# Patient Record
Sex: Male | Born: 1945
Health system: Southern US, Community
[De-identification: ages and names within clinical notes are randomized; demographics above are authoritative.]

## PROBLEM LIST (undated history)

## (undated) DIAGNOSIS — G8929 Other chronic pain: Secondary | ICD-10-CM

## (undated) DIAGNOSIS — N401 Enlarged prostate with lower urinary tract symptoms: Secondary | ICD-10-CM

## (undated) DIAGNOSIS — F329 Major depressive disorder, single episode, unspecified: Secondary | ICD-10-CM

## (undated) DIAGNOSIS — N368 Other specified disorders of urethra: Secondary | ICD-10-CM

## (undated) DIAGNOSIS — F32A Depression, unspecified: Secondary | ICD-10-CM

## (undated) DIAGNOSIS — E785 Hyperlipidemia, unspecified: Secondary | ICD-10-CM

## (undated) DIAGNOSIS — Z8719 Personal history of other diseases of the digestive system: Secondary | ICD-10-CM

## (undated) DIAGNOSIS — K760 Fatty (change of) liver, not elsewhere classified: Secondary | ICD-10-CM

## (undated) DIAGNOSIS — Z86010 Personal history of colonic polyps: Secondary | ICD-10-CM

## (undated) DIAGNOSIS — K449 Diaphragmatic hernia without obstruction or gangrene: Secondary | ICD-10-CM

## (undated) DIAGNOSIS — Z8619 Personal history of other infectious and parasitic diseases: Secondary | ICD-10-CM

## (undated) DIAGNOSIS — R972 Elevated prostate specific antigen [PSA]: Secondary | ICD-10-CM

## (undated) DIAGNOSIS — Z86006 Personal history of melanoma in-situ: Secondary | ICD-10-CM

## (undated) DIAGNOSIS — K5730 Diverticulosis of large intestine without perforation or abscess without bleeding: Secondary | ICD-10-CM

## (undated) DIAGNOSIS — G4730 Sleep apnea, unspecified: Secondary | ICD-10-CM

## (undated) DIAGNOSIS — I10 Essential (primary) hypertension: Secondary | ICD-10-CM

## (undated) DIAGNOSIS — F1011 Alcohol abuse, in remission: Secondary | ICD-10-CM

## (undated) DIAGNOSIS — N320 Bladder-neck obstruction: Secondary | ICD-10-CM

## (undated) DIAGNOSIS — Z860101 Personal history of adenomatous and serrated colon polyps: Secondary | ICD-10-CM

## (undated) DIAGNOSIS — K219 Gastro-esophageal reflux disease without esophagitis: Secondary | ICD-10-CM

## (undated) DIAGNOSIS — M549 Dorsalgia, unspecified: Secondary | ICD-10-CM

## (undated) HISTORY — PX: COLONOSCOPY: SHX174

## (undated) HISTORY — DX: Other chronic pain: G89.29

## (undated) HISTORY — DX: Hyperlipidemia, unspecified: E78.5

## (undated) HISTORY — DX: Dorsalgia, unspecified: M54.9

## (undated) HISTORY — DX: Fatty (change of) liver, not elsewhere classified: K76.0

## (undated) HISTORY — DX: Personal history of adenomatous and serrated colon polyps: Z86.0101

## (undated) HISTORY — DX: Diaphragmatic hernia without obstruction or gangrene: K44.9

## (undated) HISTORY — DX: Gastro-esophageal reflux disease without esophagitis: K21.9

## (undated) HISTORY — DX: Depression, unspecified: F32.A

## (undated) HISTORY — PX: UPPER GASTROINTESTINAL ENDOSCOPY: SHX188

## (undated) HISTORY — PX: CARDIOVASCULAR STRESS TEST: SHX262

## (undated) HISTORY — DX: Essential (primary) hypertension: I10

## (undated) HISTORY — PX: MELANOMA EXCISION: SHX5266

## (undated) HISTORY — PX: TONSILLECTOMY AND ADENOIDECTOMY: SUR1326

## (undated) HISTORY — DX: Major depressive disorder, single episode, unspecified: F32.9

## (undated) HISTORY — DX: Personal history of colonic polyps: Z86.010

---

## 1986-03-31 HISTORY — PX: OTHER SURGICAL HISTORY: SHX169

## 1999-09-15 ENCOUNTER — Encounter: Payer: Self-pay | Admitting: Internal Medicine

## 1999-09-15 ENCOUNTER — Emergency Department (HOSPITAL_COMMUNITY): Admission: EM | Admit: 1999-09-15 | Discharge: 1999-09-15 | Payer: Self-pay | Admitting: *Deleted

## 2001-06-07 ENCOUNTER — Encounter: Payer: Self-pay | Admitting: Gastroenterology

## 2002-11-18 ENCOUNTER — Encounter: Admission: RE | Admit: 2002-11-18 | Discharge: 2002-11-18 | Payer: Self-pay | Admitting: Family Medicine

## 2002-11-18 ENCOUNTER — Encounter: Payer: Self-pay | Admitting: Family Medicine

## 2003-01-29 ENCOUNTER — Ambulatory Visit (HOSPITAL_BASED_OUTPATIENT_CLINIC_OR_DEPARTMENT_OTHER): Admission: RE | Admit: 2003-01-29 | Discharge: 2003-01-29 | Payer: Self-pay | Admitting: Family Medicine

## 2004-07-16 ENCOUNTER — Ambulatory Visit: Payer: Self-pay | Admitting: Gastroenterology

## 2004-08-19 ENCOUNTER — Ambulatory Visit: Payer: Self-pay | Admitting: Gastroenterology

## 2005-05-19 ENCOUNTER — Ambulatory Visit: Payer: Self-pay | Admitting: Internal Medicine

## 2005-06-02 ENCOUNTER — Ambulatory Visit: Payer: Self-pay | Admitting: Internal Medicine

## 2005-06-19 ENCOUNTER — Encounter: Payer: Self-pay | Admitting: Cardiology

## 2005-06-19 ENCOUNTER — Ambulatory Visit: Payer: Self-pay

## 2005-08-04 ENCOUNTER — Ambulatory Visit: Payer: Self-pay | Admitting: Internal Medicine

## 2005-12-04 ENCOUNTER — Ambulatory Visit: Payer: Self-pay | Admitting: Internal Medicine

## 2006-06-02 ENCOUNTER — Ambulatory Visit: Payer: Self-pay | Admitting: Internal Medicine

## 2006-06-02 LAB — CONVERTED CEMR LAB
BUN: 19 mg/dL (ref 6–23)
Basophils Absolute: 0 10*3/uL (ref 0.0–0.1)
Basophils Relative: 0 % (ref 0–1)
CO2: 24 meq/L (ref 19–32)
Calcium: 9.2 mg/dL (ref 8.4–10.5)
Chloride: 104 meq/L (ref 96–112)
Creatinine, Ser: 0.97 mg/dL (ref 0.40–1.50)
Eosinophils Absolute: 0 10*3/uL (ref 0.0–0.7)
Eosinophils Relative: 0 % (ref 0–5)
Glucose, Bld: 103 mg/dL — ABNORMAL HIGH (ref 70–99)
HCT: 44.3 % (ref 39.0–52.0)
Hemoglobin: 15.3 g/dL (ref 13.0–17.0)
Lymphocytes Relative: 10 % — ABNORMAL LOW (ref 12–46)
Lymphs Abs: 0.8 10*3/uL (ref 0.7–3.3)
MCHC: 34.6 g/dL (ref 30.0–36.0)
MCV: 93.4 fL (ref 78.0–100.0)
Monocytes Absolute: 0.5 10*3/uL (ref 0.2–0.7)
Monocytes Relative: 7 % (ref 3–11)
Neutro Abs: 6.3 10*3/uL (ref 1.7–7.7)
Neutrophils Relative %: 83 % — ABNORMAL HIGH (ref 43–77)
Platelets: 133 10*3/uL — ABNORMAL LOW (ref 150–400)
Potassium: 4 meq/L (ref 3.5–5.3)
RBC: 4.74 M/uL (ref 4.22–5.81)
RDW: 13.3 % (ref 11.5–14.0)
Sodium: 138 meq/L (ref 135–145)
WBC: 7.6 10*3/uL (ref 4.0–10.5)

## 2006-06-09 ENCOUNTER — Ambulatory Visit: Payer: Self-pay | Admitting: Internal Medicine

## 2006-08-10 DIAGNOSIS — E785 Hyperlipidemia, unspecified: Secondary | ICD-10-CM | POA: Insufficient documentation

## 2006-08-11 ENCOUNTER — Ambulatory Visit: Payer: Self-pay | Admitting: Internal Medicine

## 2006-08-11 ENCOUNTER — Encounter: Payer: Self-pay | Admitting: Internal Medicine

## 2006-08-11 LAB — CONVERTED CEMR LAB
ALT: 42 units/L — ABNORMAL HIGH (ref 0–40)
AST: 30 units/L (ref 0–37)
Albumin: 3.7 g/dL (ref 3.5–5.2)
Alkaline Phosphatase: 74 units/L (ref 39–117)
Basophils Absolute: 0.1 10*3/uL (ref 0.0–0.1)
Basophils Relative: 1.1 % — ABNORMAL HIGH (ref 0.0–1.0)
Bilirubin, Direct: 0.1 mg/dL (ref 0.0–0.3)
Cholesterol: 233 mg/dL (ref 0–200)
Direct LDL: 142.8 mg/dL
Eosinophils Absolute: 0.1 10*3/uL (ref 0.0–0.6)
Eosinophils Relative: 1.3 % (ref 0.0–5.0)
HCT: 41.6 % (ref 39.0–52.0)
HDL: 38.2 mg/dL — ABNORMAL LOW (ref >39.0)
Hemoglobin: 14.2 g/dL (ref 13.0–17.0)
Lymphocytes Relative: 33 % (ref 12.0–46.0)
MCHC: 34.3 g/dL (ref 30.0–36.0)
MCV: 94.2 fL (ref 78.0–100.0)
Monocytes Absolute: 0.8 10*3/uL — ABNORMAL HIGH (ref 0.2–0.7)
Monocytes Relative: 12.6 % — ABNORMAL HIGH (ref 3.0–11.0)
Neutro Abs: 3.2 10*3/uL (ref 1.4–7.7)
Neutrophils Relative %: 52 % (ref 43.0–77.0)
PSA: 3.11 ng/mL (ref 0.10–4.00)
Platelets: 191 10*3/uL (ref 150–400)
RBC: 4.41 M/uL (ref 4.22–5.81)
RDW: 12.8 % (ref 11.5–14.6)
Total Bilirubin: 0.8 mg/dL (ref 0.3–1.2)
Total CHOL/HDL Ratio: 6.1
Total Protein: 6.9 g/dL (ref 6.0–8.3)
Triglycerides: 155 mg/dL — ABNORMAL HIGH (ref 0–149)
VLDL: 31 mg/dL (ref 0–40)
WBC: 6.2 10*3/uL (ref 4.5–10.5)

## 2006-08-25 ENCOUNTER — Ambulatory Visit: Payer: Self-pay | Admitting: Cardiology

## 2006-09-17 ENCOUNTER — Encounter: Payer: Self-pay | Admitting: Internal Medicine

## 2006-09-28 ENCOUNTER — Telehealth: Payer: Self-pay | Admitting: Internal Medicine

## 2006-10-13 ENCOUNTER — Ambulatory Visit: Payer: Self-pay | Admitting: Gastroenterology

## 2006-10-13 LAB — CONVERTED CEMR LAB
Amylase: 61 units/L (ref 27–131)
Folate: 18.9 ng/mL
Lipase: 21 units/L (ref 11.0–59.0)
Rheumatoid fact SerPl-aCnc: <20 intl units/mL — ABNORMAL LOW (ref 0.0–20.0)
Tissue Transglutaminase Ab, IgA: 0.6 units (ref <7)
Vitamin B-12: 471 pg/mL (ref 211–911)

## 2006-10-19 ENCOUNTER — Encounter: Payer: Self-pay | Admitting: Gastroenterology

## 2006-11-25 ENCOUNTER — Ambulatory Visit: Payer: Self-pay | Admitting: Gastroenterology

## 2006-12-02 ENCOUNTER — Ambulatory Visit: Payer: Self-pay | Admitting: Gastroenterology

## 2006-12-02 ENCOUNTER — Encounter: Payer: Self-pay | Admitting: Internal Medicine

## 2007-01-01 ENCOUNTER — Ambulatory Visit: Payer: Self-pay | Admitting: Gastroenterology

## 2007-01-01 LAB — CONVERTED CEMR LAB
Fecal Occult Blood: NEGATIVE
OCCULT 1: NEGATIVE
OCCULT 2: NEGATIVE
OCCULT 3: NEGATIVE
OCCULT 4: NEGATIVE
OCCULT 5: NEGATIVE

## 2007-02-18 ENCOUNTER — Ambulatory Visit: Payer: Self-pay | Admitting: Internal Medicine

## 2007-06-08 ENCOUNTER — Ambulatory Visit: Payer: Self-pay | Admitting: Internal Medicine

## 2007-06-08 DIAGNOSIS — I10 Essential (primary) hypertension: Secondary | ICD-10-CM | POA: Insufficient documentation

## 2007-06-08 DIAGNOSIS — B351 Tinea unguium: Secondary | ICD-10-CM

## 2007-06-08 DIAGNOSIS — T887XXA Unspecified adverse effect of drug or medicament, initial encounter: Secondary | ICD-10-CM

## 2007-06-15 ENCOUNTER — Encounter (INDEPENDENT_AMBULATORY_CARE_PROVIDER_SITE_OTHER): Payer: Self-pay | Admitting: *Deleted

## 2007-06-15 LAB — CONVERTED CEMR LAB
ALT: 35 units/L (ref 0–53)
AST: 23 units/L (ref 0–37)
Albumin: 3.7 g/dL (ref 3.5–5.2)
Alkaline Phosphatase: 77 units/L (ref 39–117)
BUN: 15 mg/dL (ref 6–23)
Basophils Absolute: 0 10*3/uL (ref 0.0–0.1)
Basophils Relative: 0.5 % (ref 0.0–1.0)
Bilirubin, Direct: 0.1 mg/dL (ref 0.0–0.3)
Creatinine, Ser: 1 mg/dL (ref 0.4–1.5)
Eosinophils Absolute: 0.1 10*3/uL (ref 0.0–0.6)
Eosinophils Relative: 1.2 % (ref 0.0–5.0)
HCT: 42.9 % (ref 39.0–52.0)
Hemoglobin: 14.4 g/dL (ref 13.0–17.0)
Hgb A1c MFr Bld: 5.6 % (ref 4.6–6.0)
Lymphocytes Relative: 30.1 % (ref 12.0–46.0)
MCHC: 33.6 g/dL (ref 30.0–36.0)
MCV: 95.3 fL (ref 78.0–100.0)
Monocytes Absolute: 0.7 10*3/uL (ref 0.2–0.7)
Monocytes Relative: 9.7 % (ref 3.0–11.0)
Neutro Abs: 4 10*3/uL (ref 1.4–7.7)
Neutrophils Relative %: 58.5 % (ref 43.0–77.0)
Platelets: 161 10*3/uL (ref 150–400)
Potassium: 3.9 meq/L (ref 3.5–5.1)
RBC: 4.5 M/uL (ref 4.22–5.81)
RDW: 12.6 % (ref 11.5–14.6)
Total Bilirubin: 0.7 mg/dL (ref 0.3–1.2)
Total Protein: 6.6 g/dL (ref 6.0–8.3)
WBC: 6.8 10*3/uL (ref 4.5–10.5)

## 2007-07-16 DIAGNOSIS — I739 Peripheral vascular disease, unspecified: Secondary | ICD-10-CM

## 2007-07-16 DIAGNOSIS — K5730 Diverticulosis of large intestine without perforation or abscess without bleeding: Secondary | ICD-10-CM | POA: Insufficient documentation

## 2007-07-16 DIAGNOSIS — K649 Unspecified hemorrhoids: Secondary | ICD-10-CM | POA: Insufficient documentation

## 2007-07-16 DIAGNOSIS — M549 Dorsalgia, unspecified: Secondary | ICD-10-CM | POA: Insufficient documentation

## 2007-07-16 DIAGNOSIS — G4700 Insomnia, unspecified: Secondary | ICD-10-CM | POA: Insufficient documentation

## 2007-07-16 DIAGNOSIS — N402 Nodular prostate without lower urinary tract symptoms: Secondary | ICD-10-CM

## 2007-09-16 ENCOUNTER — Ambulatory Visit: Payer: Self-pay | Admitting: Internal Medicine

## 2007-09-16 DIAGNOSIS — F1021 Alcohol dependence, in remission: Secondary | ICD-10-CM | POA: Insufficient documentation

## 2007-09-20 ENCOUNTER — Encounter: Payer: Self-pay | Admitting: Internal Medicine

## 2007-09-21 ENCOUNTER — Encounter (INDEPENDENT_AMBULATORY_CARE_PROVIDER_SITE_OTHER): Payer: Self-pay | Admitting: *Deleted

## 2008-03-17 ENCOUNTER — Ambulatory Visit: Payer: Self-pay | Admitting: Internal Medicine

## 2008-11-27 DIAGNOSIS — K222 Esophageal obstruction: Secondary | ICD-10-CM | POA: Insufficient documentation

## 2008-11-27 DIAGNOSIS — K449 Diaphragmatic hernia without obstruction or gangrene: Secondary | ICD-10-CM | POA: Insufficient documentation

## 2008-11-27 DIAGNOSIS — Z860100 Personal history of colon polyps, unspecified: Secondary | ICD-10-CM

## 2008-11-27 DIAGNOSIS — K210 Gastro-esophageal reflux disease with esophagitis: Secondary | ICD-10-CM

## 2008-11-27 HISTORY — DX: Personal history of colon polyps, unspecified: Z86.0100

## 2008-11-27 HISTORY — DX: Personal history of colonic polyps: Z86.010

## 2008-11-30 ENCOUNTER — Ambulatory Visit: Payer: Self-pay | Admitting: Gastroenterology

## 2008-11-30 DIAGNOSIS — R109 Unspecified abdominal pain: Secondary | ICD-10-CM | POA: Insufficient documentation

## 2008-12-07 ENCOUNTER — Ambulatory Visit (HOSPITAL_COMMUNITY): Admission: RE | Admit: 2008-12-07 | Discharge: 2008-12-07 | Payer: Self-pay | Admitting: Gastroenterology

## 2008-12-19 ENCOUNTER — Telehealth: Payer: Self-pay | Admitting: Gastroenterology

## 2009-03-13 ENCOUNTER — Ambulatory Visit: Payer: Self-pay | Admitting: Internal Medicine

## 2009-07-17 ENCOUNTER — Telehealth: Payer: Self-pay | Admitting: Gastroenterology

## 2009-07-20 ENCOUNTER — Encounter: Payer: Self-pay | Admitting: Gastroenterology

## 2009-09-29 ENCOUNTER — Emergency Department (HOSPITAL_BASED_OUTPATIENT_CLINIC_OR_DEPARTMENT_OTHER): Admission: EM | Admit: 2009-09-29 | Discharge: 2009-09-29 | Payer: Self-pay | Admitting: Emergency Medicine

## 2010-01-31 ENCOUNTER — Ambulatory Visit: Payer: Self-pay | Admitting: Internal Medicine

## 2010-02-25 ENCOUNTER — Telehealth: Payer: Self-pay | Admitting: Gastroenterology

## 2010-03-12 ENCOUNTER — Ambulatory Visit: Payer: Self-pay | Admitting: Internal Medicine

## 2010-03-12 DIAGNOSIS — J019 Acute sinusitis, unspecified: Secondary | ICD-10-CM

## 2010-04-28 LAB — CONVERTED CEMR LAB
ALT: 31 units/L (ref 0–53)
Albumin: 3.9 g/dL (ref 3.5–5.2)
Alkaline Phosphatase: 76 units/L (ref 39–117)
BUN: 15 mg/dL (ref 6–23)
CO2: 31 meq/L (ref 19–32)
Calcium: 9.3 mg/dL (ref 8.4–10.5)
Eosinophils Relative: 0.9 % (ref 0.0–5.0)
GFR calc Af Amer: 98 mL/min
Glucose, Bld: 94 mg/dL (ref 70–99)
HCT: 42.5 % (ref 39.0–52.0)
Hemoglobin: 14.8 g/dL (ref 13.0–17.0)
Monocytes Absolute: 0.5 10*3/uL (ref 0.1–1.0)
Monocytes Relative: 8.7 % (ref 3.0–12.0)
Neutro Abs: 3.6 10*3/uL (ref 1.4–7.7)
Potassium: 4.4 meq/L (ref 3.5–5.1)
RBC: 4.52 M/uL (ref 4.22–5.81)
TSH: 1.76 microintl units/mL (ref 0.35–5.50)
Total CHOL/HDL Ratio: 5.6
Total Protein: 6.9 g/dL (ref 6.0–8.3)
WBC: 5.9 10*3/uL (ref 4.5–10.5)

## 2010-05-02 NOTE — Assessment & Plan Note (Signed)
Summary: POSSIBLE URI X7 DAYS/KB   Vital Signs:  Patient profile:   65 year old male Weight:      199 pounds BMI:     32.24 Temp:     99.0 degrees F oral Pulse rate:   72 / minute Resp:     16 per minute BP sitting:   148 / 96  (left arm) Cuff size:   large  Vitals Entered By: Shonna Chock CMA (March 12, 2010 4:30 PM) CC: URI x 7days, URI symptoms   Primary Care Provider:  Elita Quick Paz,MD  CC:  URI x 7days and URI symptoms.  History of Present Illness:      This is a 65 year old man who presents with RTI symptoms X 7 days, onset as ST .  The patient reports nasal congestion,yellow  purulent nasal discharge, and productive cough with dark green, but denies earache.  Associated symptoms include fever of 100.5-103 degrees.  The patient denies dyspnea and wheezing.  The patient denies headache.  The patient denies the following risk factors for Strep sinusitis: unilateral facial pain, tooth pain, and tender adenopathy.Rx: Sudafed (see BP)    Current Medications (verified): 1)  Mylanta 200-200-20 Mg/28ml Susp (Alum & Mag Hydroxide-Simeth) .... As Needed 2)  Aciphex 20 Mg  Tbec (Rabeprazole Sodium) .... Take 1 Each Day 30 Minutes Before Meals  Allergies: 1)  ! Pcn  Physical Exam  General:  well-nourished,in no acute distress; alert,appropriate and cooperative throughout examination Ears:  External ear exam shows no significant lesions or deformities.  Otoscopic examination reveals clear canals, tympanic membranes are intact bilaterally without bulging, retraction, inflammation or discharge. Hearing is grossly normal bilaterally. Nose:  External nasal examination shows no deformity or inflammation. Nasal mucosa are pink and moist without lesions or exudates. Septal dislocation Mouth:  Oral mucosa and oropharynx without lesions or exudates.  Teeth in good repair. Lungs:  Normal respiratory effort, chest expands symmetrically. Lungs are clear to auscultation, no crackles or  wheezes. Heart:  Normal rate and regular rhythm. S1 and S2 normal without gallop, murmur, click, rub .  Cervical Nodes:  No lymphadenopathy noted Axillary Nodes:  No palpable lymphadenopathy   Impression & Recommendations:  Problem # 1:  SINUSITIS- ACUTE-NOS (ICD-461.9)  His updated medication list for this problem includes:    Smz-tmp Ds 800-160 Mg Tabs (Sulfamethoxazole-trimethoprim) .Marland Kitchen... 1 two times a day with 8 oz water    Fluticasone Propionate 50 Mcg/act Susp (Fluticasone propionate) .Marland Kitchen... 1 two times a day as needed  Problem # 2:  BRONCHITIS-ACUTE (ICD-466.0)  His updated medication list for this problem includes:    Smz-tmp Ds 800-160 Mg Tabs (Sulfamethoxazole-trimethoprim) .Marland Kitchen... 1 two times a day with 8 oz water  Complete Medication List: 1)  Mylanta 200-200-20 Mg/54ml Susp (Alum & mag hydroxide-simeth) .... As needed 2)  Aciphex 20 Mg Tbec (Rabeprazole sodium) .... Take 1 each day 30 minutes before meals 3)  Smz-tmp Ds 800-160 Mg Tabs (Sulfamethoxazole-trimethoprim) .Marland Kitchen.. 1 two times a day with 8 oz water 4)  Fluticasone Propionate 50 Mcg/act Susp (Fluticasone propionate) .Marland Kitchen.. 1 two times a day as needed  Patient Instructions: 1)  Monitor BP OFF Sudafed. Neti pot once daily - two times a day as needed for sinus congestion. 2)  Drink as much NON dairy  fluid as you can tolerate for the next few days. Prescriptions: FLUTICASONE PROPIONATE 50 MCG/ACT SUSP (FLUTICASONE PROPIONATE) 1 two times a day as needed  #1 x 3   Entered and Authorized  by:   Marga Melnick MD   Signed by:   Marga Melnick MD on 03/12/2010   Method used:   [facility]ed to ...       [facility] [phone]* (retail)       [address]       [place], [place]  [address]       Ph: [phone]       Fax: [phone]   RxID:   [id number] SMZ-TMP DS 800-160 MG TABS (SULFAMETHOXAZOLE-TRIMETHOPRIM) 1 two times a day with 8 oz water  #20 x 0   Entered and Authorized by:   Marga Melnick MD   Signed by:   Marga Melnick MD on 03/12/2010   Method used:   Faxed to ...       [facility] [phone]* (retail)       [address]       [place], [place]  [address]       Ph: [phone]       Fax: [phone]   RxID:   [id number]    Orders Added: 1)  Est. Patient Level III [31517]

## 2010-05-02 NOTE — Progress Notes (Signed)
Summary: RX   Phone Note Call from Patient Call back at Home Phone [phone]   Caller: Patient Call For: Dr Jarold Motto Reason for Call: Refill Medication, Talk to Nurse Details for Reason: RX Summary of Call: Pt needs refill on Aciphex, would like it to be sent to [facility] Initial call taken by: Dwan Bolt,  February 25, 2010 12:47 PM  Follow-up for Phone Call        rx sent Follow-up by: Harlow Mares CMA Duncan Dull),  February 25, 2010 1:44 PM    Prescriptions: ACIPHEX 20 MG  TBEC (RABEPRAZOLE SODIUM) Take 1 each day 30 minutes before meals  #30 x 6   Entered by:   Harlow Mares CMA (AAMA)   Authorized by:   Mardella Layman MD [facility]   Signed by:   Harlow Mares CMA (AAMA) on 02/25/2010   Method used:   Electronically to        [facility]* (retail)       [address]       [place], [place]  [id number]       Ph: [phone]       Fax: [phone]   RxID:   [id number] ACIPHEX 20 MG  TBEC (RABEPRAZOLE SODIUM) Take 1 each day 30 minutes before meals  #30 x 6   Entered by:   Harlow Mares CMA (AAMA)   Authorized by:   Mardella Layman MD [facility]   Signed by:   Harlow Mares CMA (AAMA) on 02/25/2010   Method used:   Electronically to        [facility] [phone]* (retail)       [address]       [place], [place]  [address]       Ph: [phone]       Fax: [phone]   RxID:   [id number]

## 2010-05-02 NOTE — Progress Notes (Signed)
Summary: Triage  Medications Added ACIPHEX 20 MG  TBEC (RABEPRAZOLE SODIUM) Take 1 each day 30 minutes before meals       Phone Note Call from Patient Call back at Home Phone [phone]   Caller: Patient Call For: Dr. Jarold Motto Reason for Call: Talk to Nurse Summary of Call: Protonix is too expensive and wants an alternative med. Initial call taken by: Karna Christmas,  July 17, 2009 12:06 PM  Follow-up for Phone Call        Pt states generic meds do not help him.  Request change in med.  Needs something that is not available in generic.  States has tried Nexium in the past and it did not help.   Follow-up by: Ashok Cordia RN,  July 17, 2009 1:43 PM    New/Updated Medications: ACIPHEX 20 MG  TBEC (RABEPRAZOLE SODIUM) Take 1 each day 30 minutes before meals Prescriptions: ACIPHEX 20 MG  TBEC (RABEPRAZOLE SODIUM) Take 1 each day 30 minutes before meals  #30 x 6   Entered by:   Ashok Cordia RN   Authorized by:   Mardella Layman MD [facility]   Signed by:   Ashok Cordia RN on 07/17/2009   Method used:   Electronically to        [facility] [phone]* (retail)       [address]       [place], [place]  [address]       Ph: [phone]       Fax: [phone]   RxID:   [id number]   Appended Document: Triage I called [employer] and a form for prior authorization was faxed to me because there has to be approval for the Aciphex.  I filled the form out and faxed to [facility].

## 2010-05-02 NOTE — Medication Information (Signed)
Summary: Aciphex Approved/[facility]  Aciphex Approved/[facility]   Imported By: Sherian Rein 07/24/2009 13:43:20  _____________________________________________________________________  External Attachment:    Type:   Image     Comment:   External Document

## 2010-05-02 NOTE — Assessment & Plan Note (Signed)
Summary: flu shot//ph   Nurse Visit   Allergies: 1)  ! Pcn  Orders Added: 1)  Admin 1st Vaccine [90471] 2)  Flu Vaccine 26yrs + [09811] Flu Vaccine Consent Questions     Do you have a history of severe allergic reactions to this vaccine? no    Any prior history of allergic reactions to egg and/or gelatin? no    Do you have a sensitivity to the preservative Thimersol? no    Do you have a past history of Guillan-Barre Syndrome? no    Do you currently have an acute febrile illness? no    Have you ever had a severe reaction to latex? no    Vaccine information given and explained to patient? yes    Are you currently pregnant? no    Lot Number:[id number]   Exp Date:09/28/2010   Site Given  Left Deltoid IM

## 2010-05-28 ENCOUNTER — Telehealth: Payer: Self-pay | Admitting: Gastroenterology

## 2010-06-06 NOTE — Progress Notes (Signed)
Summary: please change pharmacy in system   Phone Note Call from Patient Call back at Home Phone [phone]   Caller: Patient Call For: Dr Jarold Motto Reason for Call: Talk to Nurse Summary of Call: [facility]ateint states [facility]. Initial call taken by: Tawni Levy,  May 28, 2010 10:13 AM  Follow-up for Phone Call        rx sent.  Follow-up by: Harlow Mares CMA (AAMA),  May 28, 2010 10:21 AM    Prescriptions: ACIPHEX 20 MG  TBEC (RABEPRAZOLE SODIUM) Take 1 each day 30 minutes before meals  #30 x 6   Entered by:   Harlow Mares CMA (AAMA)   Authorized by:   Mardella Layman MD [facility]   Signed by:   Harlow Mares CMA (AAMA) on 05/28/2010   Method used:   Electronically to        [facility]* (retail)       [address]       [place], [place]  [id number]       Ph: [phone]       Fax: [phone]   RxID:   [id number]

## 2010-06-20 ENCOUNTER — Emergency Department (HOSPITAL_BASED_OUTPATIENT_CLINIC_OR_DEPARTMENT_OTHER)
Admission: EM | Admit: 2010-06-20 | Discharge: 2010-06-20 | Disposition: A | Discharge status: Home / Self Care | Payer: Managed Care, Other (non HMO) | Attending: Emergency Medicine | Admitting: Emergency Medicine

## 2010-06-20 DIAGNOSIS — K219 Gastro-esophageal reflux disease without esophagitis: Secondary | ICD-10-CM | POA: Insufficient documentation

## 2010-06-20 DIAGNOSIS — Y92009 Unspecified place in unspecified non-institutional (private) residence as the place of occurrence of the external cause: Secondary | ICD-10-CM | POA: Insufficient documentation

## 2010-06-20 DIAGNOSIS — Y838 Other surgical procedures as the cause of abnormal reaction of the patient, or of later complication, without mention of misadventure at the time of the procedure: Secondary | ICD-10-CM | POA: Insufficient documentation

## 2010-06-20 DIAGNOSIS — I10 Essential (primary) hypertension: Secondary | ICD-10-CM | POA: Insufficient documentation

## 2010-06-20 DIAGNOSIS — IMO0002 Reserved for concepts with insufficient information to code with codable children: Secondary | ICD-10-CM | POA: Insufficient documentation

## 2010-08-12 ENCOUNTER — Telehealth: Payer: Self-pay | Admitting: Gastroenterology

## 2010-08-12 NOTE — Telephone Encounter (Signed)
Called [facility] patient picked up aciphex on 5/7 and he still has refills, called patient and left message for him to call [facility] we [facility] not [facility] and he is not due for another rx yet.

## 2010-08-13 NOTE — Assessment & Plan Note (Signed)
[facility]                         GASTROENTEROLOGY OFFICE NOTE   Gerald [name], Gerald [name]                    MRN:          [id number]  DATE:10/13/2006                            DOB:          Nov 28, 1945    [name] is a 65 year old white male, retired male, referred for  evaluation of diarrhea, abdominal gas and bloating, guaiac positive  stools.   I hae known [name] for many years, and have done previous  colonoscopies as screening procedures for a family history of colon  cancer in his maternal grandmother.  His last exam was Aug 19, 2004, and  he did have diverticulosis present.  He has had colon polyps removed in  the past, and has had rather prominent hemorrhoids.  Also, I have been  treating him for acid reflux, which he has had for many years, which is  well controlled on daily Prevacid.  He did have an endoscopic exam  performed in March 2003, and had an esophageal stricture that was  dilated.   Gerald [name] has up to 4 to 5 loose bowel movements a day, but no nocturnal  diarrhea, anorexia, or weight loss.  He has a past history of  alcoholism, and has not drunk in 20 years.  He has no known history of  pancreatitis or hepatitis.  He denies any specific food intolerances.  As mentioned above, he has no upper GI complaints on daily PPI therapy.  He recently saw Dr. Drue [name] in March, and had a complete physical exam with  a normal CBC and metabolic profile.  Hemoccult cards at that time were  guaiac positive.  Lab data did reveal slight hyperlipidemia, but no  anemia or abnormal liver function test.  I cannot see where he has had  previous gallbladder ultrasound exam.   MEDICATIONS:  1. Protonix 40 mg a day.  2. Aspirin 81 mg every other day.  3. Fish oil 1200 mg a day.   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   FAMILY HISTORY:  Remarkable for colon cancer in maternal grandmother at  an elderly age.   SOCIAL HISTORY:  He is married  and lives with his wife and son, and has  a Naval architect.  He currently is retired.  He does not use alcohol,  and stopped smoking on June 02, 2006 after 40 years of use.   REVIEW OF SYSTEMS:  Noncontributory except for some nonspecific  complaints such as insomnia, chronic back pain, excessive urination, and  shortness of breath with exertion.  He gives no history of known  specific cardiovascular or pulmonary problems.   EXAMINATION:  He is a healthy-appearing white male who appears older  than his stated age.  He is 5 feet 7 inches tall, and weighs 196 pounds.  Blood pressure  120/80.  Pulse was 64 and regular.  I did not appreciate stigmata of chronic liver disease.  CHEST:  Generally clear.  He appeared to be in a regular rhythm without murmurs, gallops, or rubs.  ABDOMEN:  Showed no hepatosplenomegaly, masses, or tenderness.  Bowel  sounds were normal.  PERIPHERAL EXTREMITIES:  Were unremarkable without edema or phlebitis.  Mental status was clear.  Inspection of rectum did show some mixed hemorrhoids, but no digital  fistulae.   ASSESSMENT:  1. Chronic diverticulosis.  Rule out chronic malabsorption from      pancreatitis related to his previous heavy ethanol intake.  It      certainly sounds like he is having more frequent stools than normal      occurrence.  2. Guaiac positive stool with family history of colon cancer.  Rule      out colonic polyposis.  3. Well-controlled acid reflux on daily proton pump inhibitor therapy.  4. History of hyperlipidemia.  5. Long history of cigarette and alcohol abuse.  6. Chronic nonspecific urologic problems.  He was recently referred by      Dr. Drue [name] to Urology because of prostate nodule.   RECOMMENDATIONS:  1. Screening laboratory parameters including celiac panel, serum      carotene, and stool [place] stain.  2. Continue reflux regime and daily Protonix.  3. Outpatient followup colonoscopy, and perhaps endoscopy for his       positive stools.  4. Continue other medications per Dr. Drue [name].     Gerald Rea. Jarold Motto, MD, Caleen Essex, FAGA  Electronically Signed    DRP/MedQ  DD: 10/13/2006  DT: 10/14/2006  Job #: [id number]

## 2010-08-16 NOTE — Assessment & Plan Note (Signed)
[facility]                                 ON-CALL NOTE   JERRICK, FARVE                      MRN:          [id number]  DATE:06/02/2006                            DOB:          09-09-1945    Phone call came from [place] with [facility] at [phone].   He had some STAT labs - a CBC and a BMET.  Really, nothing remarkable on  the CBC.  Platelet count was borderline low at 133.  The BMET was all  normal except for a borderline glucose of 103.   PLAN:  No action needs to be taken.  The lab will be routed to Dr. Drue Novel,  the regular doctor, tomorrow.     Karie Schwalbe, MD  Electronically Signed    RIL/MedQ  DD: 06/02/2006  DT: 06/03/2006  Job #: [id number]   cc:   Willow Ora, MD

## 2010-08-29 ENCOUNTER — Ambulatory Visit (INDEPENDENT_AMBULATORY_CARE_PROVIDER_SITE_OTHER): Payer: Managed Care, Other (non HMO) | Admitting: Internal Medicine

## 2010-08-29 ENCOUNTER — Encounter: Payer: Self-pay | Admitting: Internal Medicine

## 2010-08-29 DIAGNOSIS — K219 Gastro-esophageal reflux disease without esophagitis: Secondary | ICD-10-CM

## 2010-08-29 DIAGNOSIS — Z0000 Encounter for general adult medical examination without abnormal findings: Secondary | ICD-10-CM | POA: Insufficient documentation

## 2010-08-29 LAB — BASIC METABOLIC PANEL
Calcium: 9.2 mg/dL (ref 8.4–10.5)
Chloride: 105 mEq/L (ref 96–112)
Creatinine, Ser: 0.9 mg/dL (ref 0.4–1.5)
Sodium: 143 mEq/L (ref 135–145)

## 2010-08-29 LAB — POCT URINALYSIS DIPSTICK
Glucose, UA: NEGATIVE
Ketones, UA: NEGATIVE
Leukocytes, UA: NEGATIVE
Protein, UA: 100
Urobilinogen, UA: NEGATIVE

## 2010-08-29 LAB — AST: AST: 24 U/L (ref 0–37)

## 2010-08-29 LAB — PSA: PSA: 5.66 ng/mL — ABNORMAL HIGH (ref 0.10–4.00)

## 2010-08-29 LAB — CBC WITH DIFFERENTIAL/PLATELET
Basophils Absolute: 0 10*3/uL (ref 0.0–0.1)
Eosinophils Relative: 1.2 % (ref 0.0–5.0)
HCT: 45 % (ref 39.0–52.0)
Lymphs Abs: 2 10*3/uL (ref 0.7–4.0)
Monocytes Relative: 9.4 % (ref 3.0–12.0)
Neutrophils Relative %: 58.4 % (ref 43.0–77.0)
Platelets: 158 10*3/uL (ref 150.0–400.0)
RDW: 13.5 % (ref 11.5–14.6)
WBC: 6.7 10*3/uL (ref 4.5–10.5)

## 2010-08-29 LAB — LIPID PANEL
Cholesterol: 203 mg/dL — ABNORMAL HIGH (ref 0–200)
HDL: 44.8 mg/dL (ref >39.00)
Total CHOL/HDL Ratio: 5
Triglycerides: 205 mg/dL — ABNORMAL HIGH (ref 0.0–149.0)
VLDL: 41 mg/dL — ABNORMAL HIGH (ref 0.0–40.0)

## 2010-08-29 LAB — LDL CHOLESTEROL, DIRECT: Direct LDL: 140 mg/dL

## 2010-08-29 NOTE — Assessment & Plan Note (Addendum)
TD due 2013 zostavax info provided Few years ago there was a ? of a prostate nodule, saw urology, no nodule found DRE today normal H/o + hemocults, saw GI, Cscope 2008 (tics), GI  repeated hemocults and they were neg Next Cscope: per GI Diet-exercise discussed  labs

## 2010-08-29 NOTE — Progress Notes (Signed)
Subjective:    Patient ID: KERNEY NIHART, male    DOB: 1946/02/17, 65 y.o.   MRN: [id number]  HPI CPX Past Medical History  Diagnosis Date  . Hypertension   . Hyperlipidemia   . 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 Alcohol abuse     quit 1989  . Hiatal hernia     EGD 3/10/3 stricture  . OSA (obstructive sleep apnea)     mild, per sleep study   Past Surgical History  Procedure Date  . Tonsillectomy and adenoidectomy   . Cystectomy     Right leg remotely, back 2012  . Malignant mass rmoeved     left shoulder   Family History  Problem Relation Age of Onset  . Coronary artery disease Father   . Coronary artery disease Paternal Grandfather   . Coronary artery disease Maternal Grandmother   . Hypertension Maternal Aunt   . Diabetes Neg Hx   . Colon cancer Maternal Grandmother   . Prostate cancer Neg Hx   . Stroke Neg Hx   . Melanoma Mother    History   Social History  . Marital Status: Married    Spouse Name: N/A    Number of Children: 2  . Years of Education: N/A   Occupational History  . Retired    Social History Main Topics  . Smoking status: Former Smoker    Quit date: 08/29/2006  . Smokeless tobacco: Not on file  . Alcohol Use: No     quit alcohol 1989  . Drug Use: No  . Sexually Active: Not on file   Other Topics Concern  . Not on file   Social History Narrative   Diet: regular, lots of vegetables-----exercise : none but active     Review of Systems  Constitutional: Negative for fever and fatigue.  Respiratory: Negative for cough and shortness of breath.   Cardiovascular: Negative for chest pain and leg swelling.  Gastrointestinal: Negative for nausea, diarrhea and blood in stool.  Genitourinary: Negative for dysuria, hematuria and difficulty urinating.       Occ nocturia   Psychiatric/Behavioral:       No anxiety-depression       Objective:   Physical Exam  Constitutional: He is oriented to person,  place, and time. He appears well-developed and well-nourished. No distress.  HENT:  Head: Normocephalic and atraumatic.  Neck: No thyromegaly present.       Normal carotid pulses   Cardiovascular: Normal rate, regular rhythm and normal heart sounds.   No murmur heard. Pulmonary/Chest: Effort normal and breath sounds normal. No respiratory distress. He has no wheezes. He has no rales.  Abdominal: Soft. Bowel sounds are normal. He exhibits no distension. There is no tenderness. There is no rebound and no guarding.  Genitourinary: Prostate normal.       + ext hemorrhoids, DRE otherwise normal  Musculoskeletal: He exhibits no edema.  Neurological: He is alert and oriented to person, place, and time.  Skin: Skin is warm and dry. He is not diaphoretic.  Psychiatric: He has a normal mood and affect. His behavior is normal. Judgment and thought content normal.          Assessment & Plan:

## 2010-08-30 ENCOUNTER — Telehealth: Payer: Self-pay | Admitting: *Deleted

## 2010-08-30 MED ORDER — CIPROFLOXACIN HCL 500 MG PO TABS: 500.0000 mg | ORAL_TABLET | Freq: Two times a day (BID) | ORAL | Status: AC

## 2010-08-30 NOTE — Telephone Encounter (Signed)
I spoke w/ pt he is aware. Will send in meds.

## 2010-08-30 NOTE — Telephone Encounter (Signed)
Message copied by Leanne Lovely on Fri Aug 30, 2010  1:14 PM ------      Message from: Willow Ora E      Created: Fri Aug 30, 2010 12:57 PM       Advise patient:      His cholesterol is okay, it used to be a little better, definitely recommend to eat as healthy as he can and exercise more.      His PSA is  elevated, he may have a mild prostate infection. Recommend ciprofloxacin 500 mg twice a day for 2 weeks.      Please arrange for a   PSA ---dx prostatitis in 2 months.      Other labs normal.

## 2010-10-22 ENCOUNTER — Other Ambulatory Visit: Payer: Self-pay | Admitting: Internal Medicine

## 2010-10-22 DIAGNOSIS — N410 Acute prostatitis: Secondary | ICD-10-CM

## 2010-10-23 ENCOUNTER — Other Ambulatory Visit (INDEPENDENT_AMBULATORY_CARE_PROVIDER_SITE_OTHER): Payer: Managed Care, Other (non HMO)

## 2010-10-23 DIAGNOSIS — N410 Acute prostatitis: Secondary | ICD-10-CM

## 2010-10-23 LAB — PSA: PSA: 4.32 ng/mL — ABNORMAL HIGH (ref 0.10–4.00)

## 2010-10-23 NOTE — Progress Notes (Signed)
Labs only

## 2010-10-24 NOTE — Progress Notes (Signed)
Labs only

## 2010-10-25 ENCOUNTER — Telehealth: Payer: Self-pay | Admitting: *Deleted

## 2010-10-25 NOTE — Telephone Encounter (Signed)
Pt is aware.  

## 2010-10-25 NOTE — Telephone Encounter (Signed)
Message copied by Leanne Lovely on Fri Oct 25, 2010 11:45 AM ------      Message from: Gerald [name]      Created: Fri Oct 25, 2010 10:04 AM       Advise patient, PSA is decreasing, I still would like to check in 6 months. Please arrange

## 2010-10-25 NOTE — Telephone Encounter (Signed)
Message left for patient to return my call.  

## 2011-01-02 ENCOUNTER — Other Ambulatory Visit: Payer: Self-pay | Admitting: Gastroenterology

## 2011-01-08 ENCOUNTER — Other Ambulatory Visit: Payer: Self-pay | Admitting: Gastroenterology

## 2011-01-08 MED ORDER — RABEPRAZOLE SODIUM 20 MG PO TBEC: 20.0000 mg | DELAYED_RELEASE_TABLET | Freq: Every day | ORAL | Status: DC

## 2011-01-08 NOTE — Telephone Encounter (Signed)
Notified pt he needs an OV, but I will give him 30 days of Aciphex until then. Pt will come in 01/14/11 at 3pm.

## 2011-01-08 NOTE — Telephone Encounter (Signed)
lmom for pt to call back. Pt will need an OV; last one 12/18/2008.

## 2011-01-09 ENCOUNTER — Encounter: Payer: Self-pay | Admitting: Gastroenterology

## 2011-01-14 ENCOUNTER — Ambulatory Visit (INDEPENDENT_AMBULATORY_CARE_PROVIDER_SITE_OTHER): Payer: Managed Care, Other (non HMO) | Admitting: Gastroenterology

## 2011-01-14 ENCOUNTER — Encounter: Payer: Self-pay | Admitting: Gastroenterology

## 2011-01-14 VITALS — BP 170/94 | HR 84 | Ht 66.0 in | Wt 201.0 lb

## 2011-01-14 DIAGNOSIS — Z800 Family history of malignant neoplasm of digestive organs: Secondary | ICD-10-CM

## 2011-01-14 DIAGNOSIS — K760 Fatty (change of) liver, not elsewhere classified: Secondary | ICD-10-CM

## 2011-01-14 DIAGNOSIS — Z860100 Personal history of colon polyps, unspecified: Secondary | ICD-10-CM

## 2011-01-14 DIAGNOSIS — K7689 Other specified diseases of liver: Secondary | ICD-10-CM

## 2011-01-14 DIAGNOSIS — K219 Gastro-esophageal reflux disease without esophagitis: Secondary | ICD-10-CM

## 2011-01-14 HISTORY — DX: Family history of malignant neoplasm of digestive organs: Z80.0

## 2011-01-14 HISTORY — DX: Gastro-esophageal reflux disease without esophagitis: K21.9

## 2011-01-14 MED ORDER — RABEPRAZOLE SODIUM 20 MG PO TBEC: 20.0000 mg | DELAYED_RELEASE_TABLET | Freq: Every day | ORAL | Status: DC

## 2011-01-14 NOTE — Progress Notes (Signed)
This is a 65 year old Caucasian male who has a family history his grandfather. He had colonoscopy 6 years ago which was unremarkable septal left colon diverticulosis. He currently denies diarrhea although he has frequent soft bowel movements daily without melena or hematochezia. He does have chronic acid reflux and is on AcipHex 20 mg a day without symptomatology. He also denies systemic or hepatobiliary complaints. His appetite is good his weight is stable. Review of recent laboratory data shows no abnormalities. Exam exam 2 years ago showed a fatty liver, liver function tests have been repeatedly normal. He denies any hepatobiliary symptomatology or history of hepatitis or pancreatitis.  Current Medications, Allergies, Past Medical History, Past Surgical History, Family History and Social History were reviewed in [employer] record.  Pertinent Review of Systems Negative   Physical Exam: Healthy appearing middle-aged patient in no acute distress. I cannot appreciate stigmata of chronic liver disease. Chest is clear and he is in a regular rhythm without murmurs gallops or rubs. He has an obese abdomen but no organomegaly, masses or tenderness. Bowel sounds are normal. Mental status is normal and peripheral extremities are unremarkable.    Assessment and Plan: Chronic acid reflux without previous evidence of Barrett's mucosa. He currently is doing well on a standard antireflux regime with every morning AcipHex 20 mg. I have requested IFOB cards for occult blood. He has fatty liver, but I cannot appreciate hepatosplenomegaly on physical exam or evidence of chronic liver disease. We will see him on a when necessary basis as needed. Encounter Diagnoses  Name Primary?  . GERD (gastroesophageal reflux disease) Yes  . Family history of colon cancer   . History of colon polyps

## 2011-01-14 NOTE — Patient Instructions (Signed)
Your physician has requested that you go to the basement for the following lab work before leaving today: IFOB We have sent the following medications to your pharmacy for you to pick up at your convenience: Aciphex

## 2011-01-23 ENCOUNTER — Other Ambulatory Visit: Payer: Managed Care, Other (non HMO)

## 2011-01-23 ENCOUNTER — Other Ambulatory Visit: Payer: Self-pay | Admitting: Gastroenterology

## 2011-01-23 LAB — FECAL OCCULT BLOOD, IMMUNOCHEMICAL: Fecal Occult Bld: NEGATIVE

## 2011-03-26 ENCOUNTER — Ambulatory Visit (INDEPENDENT_AMBULATORY_CARE_PROVIDER_SITE_OTHER): Payer: Managed Care, Other (non HMO) | Admitting: Family Medicine

## 2011-03-26 ENCOUNTER — Encounter: Payer: Self-pay | Admitting: Family Medicine

## 2011-03-26 ENCOUNTER — Telehealth: Payer: Self-pay | Admitting: Internal Medicine

## 2011-03-26 VITALS — BP 120/62 | HR 80 | Temp 98.7°F | Ht 66.0 in | Wt 198.8 lb

## 2011-03-26 DIAGNOSIS — J329 Chronic sinusitis, unspecified: Secondary | ICD-10-CM | POA: Insufficient documentation

## 2011-03-26 MED ORDER — AZITHROMYCIN 250 MG PO TABS: 250.0000 mg | ORAL_TABLET | Freq: Every day | ORAL | Status: DC

## 2011-03-26 MED ORDER — BENZONATATE 200 MG PO CAPS: 200.0000 mg | ORAL_CAPSULE | Freq: Three times a day (TID) | ORAL | Status: DC | PRN

## 2011-03-26 MED ORDER — BENZONATATE 200 MG PO CAPS: 200.0000 mg | ORAL_CAPSULE | Freq: Three times a day (TID) | ORAL | Status: AC | PRN

## 2011-03-26 MED ORDER — AZITHROMYCIN 250 MG PO TABS: 250.0000 mg | ORAL_TABLET | Freq: Every day | ORAL | Status: AC

## 2011-03-26 NOTE — Progress Notes (Signed)
Subjective:    Patient ID: Gerald [name], male    DOB: 21-Feb-1946, 65 y.o.   MRN: [id number]  HPI Sinus infxn- sxs started 10 days ago w/ sore throat.  1 week ago thought things were improving and then again worsened.  Cough is productive of green sputum, sinus pain, R ear pain, nasal congestion.  No fevers.  No known sick contacts.   Review of Systems For ROS see HPI     Objective:   Physical Exam  Constitutional: He appears well-developed and well-nourished. No distress.  HENT:  Head: Normocephalic and atraumatic.  Right Ear: Tympanic membrane normal.  Left Ear: Tympanic membrane normal.  Nose: Mucosal edema and rhinorrhea present. Right sinus exhibits maxillary sinus tenderness and frontal sinus tenderness. Left sinus exhibits maxillary sinus tenderness and frontal sinus tenderness.  Mouth/Throat: Mucous membranes are normal. Oropharyngeal exudate and posterior oropharyngeal erythema present. No posterior oropharyngeal edema.       + PND  Eyes: Conjunctivae and EOM are normal. Pupils are equal, round, and reactive to light.  Neck: Normal range of motion. Neck supple.  Cardiovascular: Normal rate, regular rhythm and normal heart sounds.   Pulmonary/Chest: Effort normal and breath sounds normal. No respiratory distress. He has no wheezes.       + hacking cough  Lymphadenopathy:    He has no cervical adenopathy.  Skin: Skin is warm and dry.          Assessment & Plan:

## 2011-03-26 NOTE — Assessment & Plan Note (Signed)
Pt's 2nd sickening and current sxs consistent w/ infxn.  Start abx.  Reviewed supportive care and red flags that should prompt return.  Pt expressed understanding and is in agreement w/ plan.

## 2011-03-26 NOTE — Patient Instructions (Signed)
This appears to be a sinus infection w/ possible bronchitis Take the Azithromycin as directed Add Mucinex to thin your chest and nasal congestion Use the cough meds as needed Drink plenty of fluids REST! Hang in there!! Happy New Year!

## 2011-03-26 NOTE — Telephone Encounter (Signed)
Ok with me 

## 2011-03-26 NOTE — Telephone Encounter (Signed)
Patient want to transfer to 67 doctor if it is okay with Dr. Drue Novel.

## 2011-04-21 ENCOUNTER — Encounter: Payer: Self-pay | Admitting: Family

## 2011-04-22 ENCOUNTER — Encounter: Payer: Self-pay | Admitting: Family

## 2011-04-22 ENCOUNTER — Ambulatory Visit (INDEPENDENT_AMBULATORY_CARE_PROVIDER_SITE_OTHER): Payer: Managed Care, Other (non HMO) | Admitting: Family

## 2011-04-22 DIAGNOSIS — L989 Disorder of the skin and subcutaneous tissue, unspecified: Secondary | ICD-10-CM

## 2011-04-22 DIAGNOSIS — M25551 Pain in right hip: Secondary | ICD-10-CM

## 2011-04-22 DIAGNOSIS — K219 Gastro-esophageal reflux disease without esophagitis: Secondary | ICD-10-CM

## 2011-04-22 DIAGNOSIS — M25559 Pain in unspecified hip: Secondary | ICD-10-CM

## 2011-04-22 NOTE — Patient Instructions (Signed)
Please follow up in June for your physical.  Schedule appointment with Dermatology. You may use aleve 220mg  twice daily for the next few days for hip pain.

## 2011-04-22 NOTE — Assessment & Plan Note (Signed)
Pt with new lesion on left cheek, another lesion on back. I recommended that these lesions be evaluated by dermatology.  He tells me that he is already established by dermatology and will arrange an appointment to have these areas evaluated.

## 2011-04-22 NOTE — Assessment & Plan Note (Addendum)
Stable on aciphex. Continue same.

## 2011-04-22 NOTE — Progress Notes (Signed)
Subjective:    Patient ID: Gerald [name], male    DOB: 05-26-1945, 66 y.o.   MRN: [id number]  HPI  Gerald [name] is a 66 yr old male who presents today for follow up.  He actually had a complete physical last spring with Dr. Drue Novel and is not yet due for cpx.   GERD- reports well controlled with Aciphex.   R hip pain- started on Saturday.  Thinks that  has "bursitis."  Facial lesion-  Notes that he has a spot on his left cheek which is "not going away." Also has a spot on his lower back which sometimes "itches."   Review of Systems See HPI  Past Medical History  Diagnosis Date  . Hypertension   . Hyperlipidemia   . 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 Alcohol abuse     quit 1989  . Hiatal hernia     EGD 3/10/3 stricture  . OSA (obstructive sleep apnea)     mild, per sleep study  . Onychomycosis   . Diverticulosis   . Hemorrhoids   . PVD (peripheral vascular disease)   . Fatty liver   . Hx of adenomatous colonic polyps   . GERD (gastroesophageal reflux disease)   . Esophageal stricture   . Insomnia   . Back pain, chronic     History   Social History  . Marital Status: Married    Spouse Name: N/A    Number of Children: 2  . Years of Education: N/A   Occupational History  . Retired    Social History Main Topics  . Smoking status: Former Smoker    Quit date: 08/29/2006  . Smokeless tobacco: Never Used  . Alcohol Use: No     quit alcohol 1989  . Drug Use: No  . Sexually Active: Not on file   Other Topics Concern  . Not on file   Social History Narrative   Diet: regular, lots of vegetables-----exercise : none but activeCaffeine use:  5 cups coffee daily    Past Surgical History  Procedure Date  . Tonsillectomy and adenoidectomy   . Cystectomy     Right leg remotely, back 2012  . Mass removal     left shoulder    Family History  Problem Relation Age of Onset  . Coronary artery disease Father   . Colon cancer  Father   . Cancer Father     colon  . Heart attack Paternal Grandfather   . Coronary artery disease Maternal Grandmother   . Colon cancer Maternal Grandmother   . Cancer Maternal Grandmother     colon  . Hypertension Maternal Aunt   . Diabetes Neg Hx   . Prostate cancer Neg Hx   . Stroke Neg Hx   . Melanoma Mother   . Coronary artery disease Paternal Grandmother   . Heart attack Maternal Grandfather     Allergies  Allergen Reactions  . Penicillins     Current Outpatient Prescriptions on File Prior to Visit  Medication Sig Dispense Refill  . alum & mag hydroxide-simeth (MYLANTA) 200-200-20 MG/5ML suspension Take by mouth every 6 (six) hours as needed.        . RABEprazole (ACIPHEX) 20 MG tablet Take 1 tablet (20 mg total) by mouth daily.  30 tablet  5    BP 140/90  Pulse 78  Temp(Src) 97.8 F (36.6 C) (Oral)  Resp 16  Ht 5\' 6"  (1.676  m)  Wt 199 lb 0.6 oz (90.284 kg)  BMI 32.13 kg/m2       Objective:   Physical Exam  Constitutional: He appears well-developed and well-nourished. No distress.  Cardiovascular: Normal rate and regular rhythm.   Pulmonary/Chest: Effort normal and breath sounds normal. No respiratory distress. He has no wheezes. He has no rales. He exhibits no tenderness.  Abdominal: Soft. Bowel sounds are normal. He exhibits no distension. There is no tenderness. There is no rebound and no guarding.  Skin: Skin is warm and dry.       + small pink, raised lesion on left chin. Dry raised lesion on lower back.    Psychiatric: He has a normal mood and affect. His behavior is normal. Judgment and thought content normal.          Assessment & Plan:

## 2011-04-22 NOTE — Assessment & Plan Note (Addendum)
New, ? OA, Recommended trial short term use of Aleve.

## 2011-05-07 ENCOUNTER — Telehealth: Payer: Self-pay | Admitting: Family

## 2011-05-07 DIAGNOSIS — K219 Gastro-esophageal reflux disease without esophagitis: Secondary | ICD-10-CM

## 2011-05-07 DIAGNOSIS — R972 Elevated prostate specific antigen [PSA]: Secondary | ICD-10-CM | POA: Insufficient documentation

## 2011-05-07 NOTE — Telephone Encounter (Signed)
Left message for pt to return our call. When he calls back, please let him know that I have reviewed his lab work and see that he is due for a follow up PSA (diagnosis elevated PSA.)  It was last checked in the spring.  When you speak with him, please ask him when his last visit with Urology.

## 2011-05-07 NOTE — Telephone Encounter (Signed)
Notified pt. He thinks it may have been 9 months since last seen by urology, not really sure. Advised pt that he will need to return to the lab to have it checked. Pt states he can come by around 05/13/11. Future lab order entered and copy forwarded to the lab.

## 2011-05-14 ENCOUNTER — Telehealth: Payer: Self-pay | Admitting: Family

## 2011-05-14 DIAGNOSIS — R972 Elevated prostate specific antigen [PSA]: Secondary | ICD-10-CM

## 2011-05-14 LAB — PSA: PSA: 5.14 ng/mL — ABNORMAL HIGH (ref <=4.00)

## 2011-05-14 NOTE — Telephone Encounter (Signed)
Reviewed PSA- was 4.32, now up to 5.14.  I would like for him to go back to see Dr. Brunilda Payor, urology for follow up please.  I have pended referral.

## 2011-05-14 NOTE — Telephone Encounter (Signed)
Addended by: Mervin Kung A on: 05/14/2011 09:41 AM   Modules accepted: Orders

## 2011-05-14 NOTE — Telephone Encounter (Signed)
Left message on machine to return my call. 

## 2011-05-15 NOTE — Telephone Encounter (Signed)
Pt notified and agreeable to proceed with urology appt.

## 2011-05-16 ENCOUNTER — Encounter: Payer: Self-pay | Admitting: Internal Medicine

## 2011-05-16 ENCOUNTER — Ambulatory Visit (INDEPENDENT_AMBULATORY_CARE_PROVIDER_SITE_OTHER): Payer: Managed Care, Other (non HMO) | Admitting: Internal Medicine

## 2011-05-16 VITALS — BP 132/90 | HR 80 | Temp 98.8°F | Resp 20 | Ht 66.0 in | Wt 198.0 lb

## 2011-05-16 DIAGNOSIS — J069 Acute upper respiratory infection, unspecified: Secondary | ICD-10-CM

## 2011-05-16 MED ORDER — AZITHROMYCIN 250 MG PO TABS: ORAL_TABLET | ORAL | Status: AC

## 2011-05-22 ENCOUNTER — Telehealth: Payer: Self-pay | Admitting: Family

## 2011-05-22 DIAGNOSIS — J069 Acute upper respiratory infection, unspecified: Secondary | ICD-10-CM | POA: Insufficient documentation

## 2011-05-22 NOTE — Assessment & Plan Note (Signed)
Given abx to hold. Begin abx if no improvement after total duration of 8-10 days. Followup if no improvement or worsening.

## 2011-05-22 NOTE — Progress Notes (Signed)
Subjective:    Patient ID: Gerald [name], male    DOB: 1945-11-04, 66 y.o.   MRN: [id number]  HPI Pt presents to clinic for evaluation of cough. Notes 4d h/o np cough with clear nasal drainage. Taking otc medication without improvement. No f/c or dyspnea. No alleviating or exacerbating factors. No other complaints.   Past Medical History  Diagnosis Date  . Hypertension   . Hyperlipidemia   . 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 Alcohol abuse     quit 1989  . Hiatal hernia     EGD 3/10/3 stricture  . OSA (obstructive sleep apnea)     mild, per sleep study  . Onychomycosis   . Diverticulosis   . Hemorrhoids   . PVD (peripheral vascular disease)   . Fatty liver   . Hx of adenomatous colonic polyps   . GERD (gastroesophageal reflux disease)   . Esophageal stricture   . Insomnia   . Back pain, chronic    Past Surgical History  Procedure Date  . Tonsillectomy and adenoidectomy   . Cystectomy     Right leg remotely, back 2012  . Mass removal     left shoulder    reports that he quit smoking about 4 years ago. He has never used smokeless tobacco. He reports that he does not drink alcohol or use illicit drugs. family history includes Cancer in his father and maternal grandmother; Colon cancer in his father and maternal grandmother; Coronary artery disease in his father, maternal grandmother, and paternal grandmother; Heart attack in his maternal grandfather and paternal grandfather; Hypertension in his maternal aunt; and Melanoma in his mother.  There is no history of Diabetes, and Prostate cancer, and Stroke, . Allergies  Allergen Reactions  . Penicillins      Review of Systems see hpi     Objective:   Physical Exam  Nursing note and vitals reviewed. Constitutional: He appears well-developed and well-nourished. No distress.  HENT:  Head: Normocephalic and atraumatic.  Right Ear: External ear normal.  Left Ear: External ear normal.    Nose: Nose normal.  Mouth/Throat: Oropharynx is clear and moist. No oropharyngeal exudate.  Eyes: Conjunctivae are normal. Right eye exhibits no discharge. Left eye exhibits no discharge. No scleral icterus.  Cardiovascular: Normal rate, regular rhythm and normal heart sounds.   Pulmonary/Chest: Effort normal and breath sounds normal. No respiratory distress. He has no wheezes. He has no rales.  Neurological: He is alert.  Skin: He is not diaphoretic.  Psychiatric: He has a normal mood and affect.          Assessment & Plan:

## 2011-05-22 NOTE — Telephone Encounter (Signed)
Patient came in to see Dr Rodena Medin.  He gave the patient an rx for a z pack.  Patient wants it noted in his chart that he did not fill or use the z pack

## 2011-05-23 NOTE — Telephone Encounter (Signed)
Noted  

## 2011-07-07 ENCOUNTER — Encounter: Payer: Self-pay | Admitting: Family

## 2011-07-07 ENCOUNTER — Ambulatory Visit (INDEPENDENT_AMBULATORY_CARE_PROVIDER_SITE_OTHER): Payer: Managed Care, Other (non HMO) | Admitting: Family

## 2011-07-07 VITALS — BP 138/82 | HR 72 | Temp 97.9°F | Resp 16 | Ht 66.0 in | Wt 201.0 lb

## 2011-07-07 DIAGNOSIS — M549 Dorsalgia, unspecified: Secondary | ICD-10-CM

## 2011-07-07 DIAGNOSIS — R972 Elevated prostate specific antigen [PSA]: Secondary | ICD-10-CM

## 2011-07-07 MED ORDER — MELOXICAM 7.5 MG PO TABS: 7.5000 mg | ORAL_TABLET | Freq: Every day | ORAL | Status: DC

## 2011-07-07 NOTE — Patient Instructions (Signed)
Please call if your symptoms worsen, or if no improvement in 2 weeks.

## 2011-07-07 NOTE — Assessment & Plan Note (Signed)
He is now on empiric cipro per urology.  Defer management to urology.

## 2011-07-07 NOTE — Assessment & Plan Note (Signed)
66 yr old male with mild low back pain, radiates into the left heel.  Will give trial of Meloxicam.  If symptoms worsen, or if no improvement, consider further imaging of the Lumbar spine.

## 2011-07-07 NOTE — Progress Notes (Signed)
Subjective:    Patient ID: Gerald [name], male    DOB: [birth year], 66 y.o.   MRN: [id number]  HPI  Mr.  [name] presents today with chief complaint of low back pain and left heel pain since last Saturday.  Started after prolonged yard work.  Notes that pain is "better today than it was." But notes that the heel is bothering him the most right now.  Pain is worsened by weight bearing.    Elevated PSA- Saw Dr.  Brunilda Payor-  On cipro with plans for follow up PSA at his office following treatment.    Review of Systems See HPI  Past Medical History  Diagnosis Date  . Hypertension   . Hyperlipidemia   . 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 Alcohol abuse     quit 1989  . Hiatal hernia     EGD 3/10/3 stricture  . OSA (obstructive sleep apnea)     mild, per sleep study  . Onychomycosis   . Diverticulosis   . Hemorrhoids   . PVD (peripheral vascular disease)   . Fatty liver   . Hx of adenomatous colonic polyps   . GERD (gastroesophageal reflux disease)   . Esophageal stricture   . Insomnia   . Back pain, chronic     History   Social History  . Marital Status: Married    Spouse Name: N/A    Number of Children: 2  . Years of Education: N/A   Occupational History  . Retired    Social History Main Topics  . Smoking status: Former Smoker    Quit date: 08/29/2006  . Smokeless tobacco: Never Used  . Alcohol Use: No     quit alcohol 1989  . Drug Use: No  . Sexually Active: Not on file   Other Topics Concern  . Not on file   Social History Narrative   Diet: regular, lots of vegetables-----exercise : none but activeCaffeine use:  5 cups coffee daily    Past Surgical History  Procedure Date  . Tonsillectomy and adenoidectomy   . Cystectomy     Right leg remotely, back 2012  . Mass removal     left shoulder    Family History  Problem Relation Age of Onset  . Coronary artery disease Father   . Colon cancer Father   . Cancer Father       colon  . Heart attack Paternal Grandfather   . Coronary artery disease Maternal Grandmother   . Colon cancer Maternal Grandmother   . Cancer Maternal Grandmother     colon  . Hypertension Maternal Aunt   . Diabetes Neg Hx   . Prostate cancer Neg Hx   . Stroke Neg Hx   . Melanoma Mother   . Coronary artery disease Paternal Grandmother   . Heart attack Maternal Grandfather     Allergies  Allergen Reactions  . Penicillins     Current Outpatient Prescriptions on File Prior to Visit  Medication Sig Dispense Refill  . alum & mag hydroxide-simeth (MYLANTA) 200-200-20 MG/5ML suspension Take by mouth every 6 (six) hours as needed.        . RABEprazole (ACIPHEX) 20 MG tablet Take 1 tablet (20 mg total) by mouth daily.  30 tablet  5    BP 138/82  Pulse 72  Temp(Src) 97.9 F (36.6 C) (Oral)  Resp 16  Ht 5\' 6"  (1.676 m)  Wt 201 lb 0.6 oz (  91.191 kg)  BMI 32.45 kg/m2  SpO2 98%       Objective:   Physical Exam  Constitutional: He is oriented to person, place, and time. He appears well-developed and well-nourished. No distress.  Cardiovascular: Normal rate and regular rhythm.   No murmur heard. Pulmonary/Chest: Effort normal and breath sounds normal. No respiratory distress. He has no wheezes. He has no rales. He exhibits no tenderness.  Neurological: He is alert and oriented to person, place, and time.  Reflex Scores:      Patellar reflexes are 1+ on the right side and 1+ on the left side.      Bilateral LE strength is 5/5.  Steady even gait.  Mild discomfort of low back with left straight leg pain.  Able to toe walk without difficulty.  Increased left heel pain with heel walking.  No heel tenderness to palpation.   Skin: Skin is warm and dry.  Psychiatric: He has a normal mood and affect. His behavior is normal. Judgment and thought content normal.          Assessment & Plan:

## 2011-08-15 ENCOUNTER — Other Ambulatory Visit: Payer: Self-pay | Admitting: Gastroenterology

## 2011-08-18 ENCOUNTER — Encounter: Payer: Self-pay | Admitting: *Deleted

## 2011-08-19 ENCOUNTER — Telehealth: Payer: Self-pay | Admitting: Gastroenterology

## 2011-08-19 NOTE — Telephone Encounter (Signed)
An approval has already been received for the Aciphex as of 08/18/11 until 03/30/2098. Please see scanned approval under "media." I have left a voicemail for patient advising him of this.

## 2011-08-27 ENCOUNTER — Ambulatory Visit (INDEPENDENT_AMBULATORY_CARE_PROVIDER_SITE_OTHER): Payer: Managed Care, Other (non HMO) | Admitting: Family

## 2011-08-27 ENCOUNTER — Encounter: Payer: Self-pay | Admitting: Family

## 2011-08-27 VITALS — BP 160/92 | HR 68 | Temp 98.4°F | Resp 16 | Wt 200.0 lb

## 2011-08-27 DIAGNOSIS — M722 Plantar fascial fibromatosis: Secondary | ICD-10-CM

## 2011-08-27 DIAGNOSIS — M79673 Pain in unspecified foot: Secondary | ICD-10-CM

## 2011-08-27 DIAGNOSIS — Z2911 Encounter for prophylactic immunotherapy for respiratory syncytial virus (RSV): Secondary | ICD-10-CM

## 2011-08-27 DIAGNOSIS — M79609 Pain in unspecified limb: Secondary | ICD-10-CM

## 2011-08-27 DIAGNOSIS — Z23 Encounter for immunization: Secondary | ICD-10-CM

## 2011-08-27 NOTE — Patient Instructions (Signed)
You will be contact about your referral to podiatry.  Please let us know if you have not heard back within 1 week about your referral.

## 2011-08-27 NOTE — Progress Notes (Signed)
Subjective:    Patient ID: Gerald [name], male    DOB: [birth year], 66 y.o.   MRN: [id number]  HPI  [name] is a 66 yr old male who presents today to discuss pain in the left arch.   Pt reports some pain in the left arch. Pain started several weeks ago after he mowed the lawn in old unsupportive shoes.   He has been working with a Land who told him that he "tore his arch."  He has been rolling foot on frozen bottle with some relief.  He is requesting a referral to a specialist.   Review of Systems    see HPI  Past Medical History  Diagnosis Date  . Hypertension   . Hyperlipidemia   . 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 Alcohol abuse     quit 1989  . Hiatal hernia     EGD 3/10/3 stricture  . OSA (obstructive sleep apnea)     mild, per sleep study  . Onychomycosis   . Diverticulosis   . Hemorrhoids   . PVD (peripheral vascular disease)   . Fatty liver   . Hx of adenomatous colonic polyps   . GERD (gastroesophageal reflux disease)   . Esophageal stricture   . Insomnia   . Back pain, chronic     History   Social History  . Marital Status: Married    Spouse Name: N/A    Number of Children: 2  . Years of Education: N/A   Occupational History  . Retired    Social History Main Topics  . Smoking status: Former Smoker    Quit date: 08/29/2006  . Smokeless tobacco: Never Used  . Alcohol Use: No     quit alcohol 1989  . Drug Use: No  . Sexually Active: Not on file   Other Topics Concern  . Not on file   Social History Narrative   Diet: regular, lots of vegetables-----exercise : none but activeCaffeine use:  5 cups coffee daily    Past Surgical History  Procedure Date  . Tonsillectomy and adenoidectomy   . Cystectomy     Right leg remotely, back 2012  . Mass removal     left shoulder    Family History  Problem Relation Age of Onset  . Coronary artery disease Father   . Colon cancer Father   . Cancer  Father     colon  . Heart attack Paternal Grandfather   . Coronary artery disease Maternal Grandmother   . Colon cancer Maternal Grandmother   . Cancer Maternal Grandmother     colon  . Hypertension Maternal Aunt   . Diabetes Neg Hx   . Prostate cancer Neg Hx   . Stroke Neg Hx   . Melanoma Mother   . Coronary artery disease Paternal Grandmother   . Heart attack Maternal Grandfather     Allergies  Allergen Reactions  . Penicillins     Current Outpatient Prescriptions on File Prior to Visit  Medication Sig Dispense Refill  . ACIPHEX 20 MG tablet TAKE 1 TABLET DAILY.  30 each  11  . alum & mag hydroxide-simeth (MYLANTA) 200-200-20 MG/5ML suspension Take by mouth every 6 (six) hours as needed.        . ciprofloxacin (CIPRO) 500 MG tablet Take 500 mg by mouth 2 (two) times daily.      . meloxicam (MOBIC) 7.5 MG tablet Take 1 tablet (7.5  mg total) by mouth daily.  10 tablet  0    BP 160/92  Pulse 68  Temp(Src) 98.4 F (36.9 C) (Oral)  Resp 16  Wt 200 lb 0.6 oz (90.738 kg)    Objective:   Physical Exam  Constitutional: He is oriented to person, place, and time. He appears well-developed and well-nourished. No distress.  Cardiovascular: Normal rate and regular rhythm.   No murmur heard. Pulmonary/Chest: Effort normal and breath sounds normal. No respiratory distress. He has no wheezes. He has no rales. He exhibits no tenderness.  Musculoskeletal:       + tenderness to palpation of left mid arch without swelling.   Neurological: He is alert and oriented to person, place, and time. No cranial nerve deficit. He exhibits normal muscle tone. Coordination normal.  Skin: Skin is warm and dry.  Psychiatric: He has a normal mood and affect. His behavior is normal. Judgment and thought content normal.          Assessment & Plan:   BP Readings from Last 3 Encounters:  08/27/11 160/92  07/07/11 138/82  05/16/11 132/90

## 2011-08-29 ENCOUNTER — Encounter: Payer: Self-pay | Admitting: Family

## 2011-08-29 ENCOUNTER — Telehealth: Payer: Self-pay | Admitting: *Deleted

## 2011-08-29 ENCOUNTER — Telehealth: Payer: Self-pay | Admitting: Family

## 2011-08-29 ENCOUNTER — Ambulatory Visit (INDEPENDENT_AMBULATORY_CARE_PROVIDER_SITE_OTHER): Payer: Managed Care, Other (non HMO) | Admitting: Family

## 2011-08-29 VITALS — BP 160/90 | HR 81 | Temp 98.2°F | Resp 16

## 2011-08-29 DIAGNOSIS — M722 Plantar fascial fibromatosis: Secondary | ICD-10-CM | POA: Insufficient documentation

## 2011-08-29 DIAGNOSIS — L0390 Cellulitis, unspecified: Secondary | ICD-10-CM | POA: Insufficient documentation

## 2011-08-29 DIAGNOSIS — R209 Unspecified disturbances of skin sensation: Secondary | ICD-10-CM

## 2011-08-29 DIAGNOSIS — R202 Paresthesia of skin: Secondary | ICD-10-CM

## 2011-08-29 MED ORDER — SULFAMETHOXAZOLE-TMP DS 800-160 MG PO TABS: 1.0000 | ORAL_TABLET | Freq: Two times a day (BID) | ORAL | Status: AC

## 2011-08-29 NOTE — Telephone Encounter (Signed)
Error

## 2011-08-29 NOTE — Assessment & Plan Note (Signed)
Surrounding nose.  I am not certain what is causing this, but it is mid-line.  I have asked him to monitor this and let us know if symptoms worsen.  He is also advised to go to the ER if he develops unitateral numbness, weakness.  He verbalizes understanding.

## 2011-08-29 NOTE — Telephone Encounter (Signed)
Reviewed with pt.  He denies numbness, weakness of the arms/legs.  Numbness is below nose and lips bilaterally.  Pt will be evaluated in the office later today.

## 2011-08-29 NOTE — Telephone Encounter (Signed)
Received call from pt stating his arm is swollen and painful at the injection site of his shingles vaccine. Currently experiencing some numbness/tingling across his nose and lips. Pt denies difficulty breathing or swelling of tongue. Advised pt to go to the ER for worsening of symptoms or difficulty breathing.

## 2011-08-29 NOTE — Assessment & Plan Note (Signed)
He has redness/warmth right latera upper arm.  Recommended bactrim, area was outlined.  Pt to call if redness increases in size.  Pt verbalizes understanding.

## 2011-08-29 NOTE — Progress Notes (Signed)
Subjective:    Patient ID: Gerald [name], male    DOB: Mar 31, 1946, 66 y.o.   MRN: [id number]  HPI  [name] is a 66 yr old male who presents today with chief complaint of redness of the right upper arm at the site of his zostavax injection.  He reports area is very tender.  Also notes some tingling- "like a circle around my nose" but "not bad."   Review of Systems    see HPI   Objective:   Physical Exam  Constitutional: He appears well-developed and well-nourished. No distress.  Cardiovascular: Normal rate and regular rhythm.   No murmur heard. Pulmonary/Chest: Effort normal and breath sounds normal. No respiratory distress. He has no wheezes. He has no rales. He exhibits no tenderness.  Neurological:       + facial symmetry, MAE, no facial drooping noted.  Normal speech.   Skin:          R upper outer arm- warm, red, swollen.  Area was outline.           Assessment & Plan:

## 2011-08-29 NOTE — Patient Instructions (Signed)
Call if your redness increases in size or if increased pain/swelling.  Follow up Monday as scheduled.

## 2011-08-29 NOTE — Assessment & Plan Note (Signed)
Will refer to Podiatry for further evaluation.

## 2011-09-01 ENCOUNTER — Ambulatory Visit: Payer: Medicare Other | Admitting: Family

## 2011-09-01 ENCOUNTER — Telehealth: Payer: Self-pay | Admitting: *Deleted

## 2011-09-01 ENCOUNTER — Encounter: Payer: Self-pay | Admitting: Family

## 2011-09-01 ENCOUNTER — Ambulatory Visit (INDEPENDENT_AMBULATORY_CARE_PROVIDER_SITE_OTHER): Payer: Medicare Other | Admitting: Family

## 2011-09-01 VITALS — BP 140/90 | HR 71 | Temp 98.7°F | Resp 16 | Ht 66.0 in | Wt 200.0 lb

## 2011-09-01 DIAGNOSIS — F1021 Alcohol dependence, in remission: Secondary | ICD-10-CM | POA: Diagnosis not present

## 2011-09-01 DIAGNOSIS — R030 Elevated blood-pressure reading, without diagnosis of hypertension: Secondary | ICD-10-CM | POA: Diagnosis not present

## 2011-09-01 DIAGNOSIS — L0390 Cellulitis, unspecified: Secondary | ICD-10-CM

## 2011-09-01 DIAGNOSIS — K7689 Other specified diseases of liver: Secondary | ICD-10-CM

## 2011-09-01 DIAGNOSIS — Z0000 Encounter for general adult medical examination without abnormal findings: Secondary | ICD-10-CM | POA: Diagnosis not present

## 2011-09-01 DIAGNOSIS — Z136 Encounter for screening for cardiovascular disorders: Secondary | ICD-10-CM | POA: Diagnosis not present

## 2011-09-01 DIAGNOSIS — I10 Essential (primary) hypertension: Secondary | ICD-10-CM | POA: Insufficient documentation

## 2011-09-01 DIAGNOSIS — R972 Elevated prostate specific antigen [PSA]: Secondary | ICD-10-CM

## 2011-09-01 DIAGNOSIS — K760 Fatty (change of) liver, not elsewhere classified: Secondary | ICD-10-CM

## 2011-09-01 DIAGNOSIS — E785 Hyperlipidemia, unspecified: Secondary | ICD-10-CM | POA: Diagnosis not present

## 2011-09-01 DIAGNOSIS — K219 Gastro-esophageal reflux disease without esophagitis: Secondary | ICD-10-CM

## 2011-09-01 DIAGNOSIS — L0291 Cutaneous abscess, unspecified: Secondary | ICD-10-CM | POA: Diagnosis not present

## 2011-09-01 HISTORY — DX: Essential (primary) hypertension: I10

## 2011-09-01 LAB — LIPID PANEL
Cholesterol: 180 mg/dL (ref 0–200)
LDL Cholesterol: 110 mg/dL — ABNORMAL HIGH (ref 0–99)
Total CHOL/HDL Ratio: 5.1 Ratio
Triglycerides: 176 mg/dL — ABNORMAL HIGH (ref <150)
VLDL: 35 mg/dL (ref 0–40)

## 2011-09-01 LAB — HEPATIC FUNCTION PANEL
ALT: 35 U/L (ref 0–53)
AST: 24 U/L (ref 0–37)
Bilirubin, Direct: 0.1 mg/dL (ref 0.0–0.3)
Indirect Bilirubin: 0.4 mg/dL (ref 0.0–0.9)
Total Bilirubin: 0.5 mg/dL (ref 0.3–1.2)

## 2011-09-01 LAB — BASIC METABOLIC PANEL
Chloride: 104 mEq/L (ref 96–112)
Potassium: 5 mEq/L (ref 3.5–5.3)

## 2011-09-01 NOTE — Patient Instructions (Signed)
You will be contacted about your abdominal ultrasound. Work hard on lowfat/low sodium diet, exercise, weight loss. Schedule a follow up visit with dermatology for skin check up and to evaluate the spot on your back. Follow up in 3 months.

## 2011-09-01 NOTE — Assessment & Plan Note (Signed)
Improving.  Continue bactrim.

## 2011-09-01 NOTE — Assessment & Plan Note (Signed)
Check PSA today. 

## 2011-09-01 NOTE — Assessment & Plan Note (Signed)
BP Readings from Last 3 Encounters:  09/01/11 140/90  08/29/11 160/90  08/27/11 160/92  BP is much better today, but remains borderline.  Plan low sodium diet, exercise, weight loss- repeat in 3 months.

## 2011-09-01 NOTE — Assessment & Plan Note (Signed)
Obtain lfts 

## 2011-09-01 NOTE — Assessment & Plan Note (Signed)
Obtain lipid panel.  Counseled pt on diet, exercise, weight loss.

## 2011-09-01 NOTE — Progress Notes (Signed)
Subjective:    Patient ID: Gerald [name], male    DOB: 07-12-1945, 66 y.o.   MRN: [id number]  HPI   Subjective:   Patient here for Medicare annual wellness visit and management of other chronic and acute problems. Risk factors:    Mr.  [name] is a 66 yr old male who presents today for follow up.  Elevated PSA- reports that he completed empiric cipro provided by Dr. Brunilda Payor (urology).  He tells me that it was recommended that he have a prostate biopsy and he refused this.  He reports that he plans to follow with Dr. Brunilda Payor quarterly for serial PSA monitoring.   Fam hx of colon ca-  Grandmother- was in her late 41's.    GERD-  Continues aciphex.  Feels like this has been stable.    Hyperlipidemia-  Not on meds and has never been on meds.  Hx ETOH abuse-quit ETOH in 1986.  Declines tetanus- up to date on colo.    Right arm cellulitis- reports improvement since starting bactrim.   Roster of Physicians Providing Medical Care to Patient: Dr. Su Grand Dr. Jarold Motto- GI. Dr. Tommy Medal (opthalmology)  Dr. Manley [facility]  Activities of Daily Living  In your present state of health, do you have any difficulty performing the following activities? Preparing food and eating?: No  Bathing yourself: No  Getting dressed: No  Using the toilet:No  Moving around from place to place: No  In the past year have you fallen or had a near fall?:No    Home Safety: Has smoke detector and wears seat belts. No firearms. No excess sun exposure.  Diet and Exercise  Current exercise habits: No.  Wants to restart exercise.   Dietary issues discussed: healthy diet   Depression Screen  (Note: if answer to either of the following is "Yes", then a more complete depression screening is indicated)  Q1: Over the past two weeks, have you felt down, depressed or hopeless?no  Q2: Over the past two weeks, have you felt little interest or pleasure in doing things? no   The following portions of the patient's  history were reviewed and updated as appropriate: allergies, current medications, past family history, past medical history, past social history, past surgical history and problem list.    Objective:   Vision:see nursing Hearing: able to hear forced whisper at 6 ft. Body mass index:see vitals Cognitive Impairment Assessment: cognition, memory and judgment appear normal.   Assessment:   Medicare wellness utd on preventive parameters   Plan:    During the course of the visit the patient was educated and counseled about appropriate screening and preventive services including: Bone densitometry screening- declines Diabetes screening- bmet Nutrition counseling - done Abdominal aneurysm screening- ordered Colonoscopy- up to date.  Vaccines / LABS  Obtain labs today- PSA, bmet, lft, FLP  Due for tetanus- declines.    Patient Instructions (the written plan) was given to the patient.       Review of Systems  Constitutional: Negative for fever.  HENT: Negative for congestion.   Eyes:       Needs distance glasses  Respiratory: Negative for cough.   Cardiovascular: Negative for chest pain, palpitations and leg swelling.  Gastrointestinal: Negative for nausea, vomiting and diarrhea.  Genitourinary: Negative for dysuria.       Nocturia- every 2-3 hours  Musculoskeletal: Negative for myalgias and arthralgias.  Skin: Negative for rash.  Neurological: Negative for dizziness.  Hematological: Negative for adenopathy.  Psychiatric/Behavioral:  Denies depression or anxiety       Objective:   Physical Exam  Physical Exam  Constitutional: He is oriented to person, place, and time. He appears well-developed and well-nourished. No distress.  HENT:  Head: Normocephalic and atraumatic.  Right Ear: Tympanic membrane and ear canal normal.  Left Ear: Tympanic membrane and ear canal normal.  Mouth/Throat: Oropharynx is clear and moist.  Eyes: Pupils are equal, round, and reactive  to light. No scleral icterus.  Neck: Normal range of motion. No thyromegaly present.  Cardiovascular: Normal rate and regular rhythm.   No murmur heard. Pulmonary/Chest: Effort normal and breath sounds normal. No respiratory distress. He has no wheezes. He has no rales. He exhibits no tenderness.  Abdominal: Soft. Bowel sounds are normal. He exhibits no distension and no mass. There is no tenderness. There is no rebound and no guarding.  Musculoskeletal: He exhibits no edema.  Lymphadenopathy:    He has no cervical adenopathy.  Neurological: He is alert and oriented to person, place, and time. He has normal reflexes. He exhibits normal muscle tone. Coordination normal.  Skin: Skin is warm and dry. Right arm cellulitis- erythema is nearly resolve.  Small area of induration remains which is less tender today. Small raised pink lesion noted on mid back. Psychiatric: He has a normal mood and affect. His behavior is normal. Judgment and thought content normal.  GU: DRE deferred to urology.        Assessment & Plan:         Assessment & Plan:

## 2011-09-01 NOTE — Telephone Encounter (Signed)
Pt here for fasting labs prior to medicare wellness exam this afternoon. Orders placed for: lfts, psa, bmp, lipids and UA per verbal from Provider.

## 2011-09-02 ENCOUNTER — Telehealth: Payer: Self-pay | Admitting: Family

## 2011-09-02 LAB — URINALYSIS, ROUTINE W REFLEX MICROSCOPIC
Bilirubin Urine: NEGATIVE
Ketones, ur: NEGATIVE mg/dL
Nitrite: NEGATIVE
Protein, ur: NEGATIVE mg/dL
Specific Gravity, Urine: 1.021 (ref 1.005–1.030)
Urobilinogen, UA: 0.2 mg/dL (ref 0.0–1.0)

## 2011-09-02 NOTE — Telephone Encounter (Signed)
Spoke with Windell Moulding, Dr. Madilyn Hook nurse re: pt's rising psa.  She will forward result to Dr. Brunilda Payor to review.

## 2011-09-03 ENCOUNTER — Telehealth: Payer: Self-pay | Admitting: Family

## 2011-09-03 NOTE — Telephone Encounter (Signed)
Please call patient and let him know that I have reviewed his lab work.  PSA is continuing to rise.  I have forwarded this to Dr. Brunilda Payor.  I recommend that he discuss these results with Dr. Brunilda Payor for further recommendations.  Cholesterol is improved from last year.  Kidney function, liver function, urinalysis are normal.

## 2011-09-03 NOTE — Telephone Encounter (Signed)
Call placed to patient at [phone], he was informed per Sandford Craze instructions,has verbalized understanding, and agrees as instructed.

## 2011-09-05 DIAGNOSIS — M722 Plantar fascial fibromatosis: Secondary | ICD-10-CM | POA: Diagnosis not present

## 2011-09-22 ENCOUNTER — Telehealth: Payer: Self-pay | Admitting: Gastroenterology

## 2011-09-22 ENCOUNTER — Encounter (INDEPENDENT_AMBULATORY_CARE_PROVIDER_SITE_OTHER): Payer: Medicare Other

## 2011-09-22 DIAGNOSIS — Z0000 Encounter for general adult medical examination without abnormal findings: Secondary | ICD-10-CM

## 2011-09-22 DIAGNOSIS — Z136 Encounter for screening for cardiovascular disorders: Secondary | ICD-10-CM

## 2011-09-24 ENCOUNTER — Other Ambulatory Visit: Payer: Self-pay

## 2011-09-24 NOTE — Telephone Encounter (Signed)
Per pt's request called [phone] to get prior auth. On his Aciphex 20mg  one tab every day.  It was approved 09/24/11-03/30/12.  Called [facility] at [phone] to let them know and they told me that their computer shows his Rx has been transferred somewhere else.  Therefore I called pt and LM that his medicine was approved and to check with his current drug store.  Not sure what that is, we will have to update that next time we speak to him.  Left my name and # to call back with any futher questions.  The computer also shows the Aciphex was approved back in may thru [facility].  This form is under the media section in Epic.

## 2011-10-14 NOTE — Telephone Encounter (Addendum)
Affected are of pt's arm was measured to be 8 x 11.5cm at office visit on 08/29/11. VAERS form completed and faxed to [phone] 09/17/11.

## 2011-10-15 DIAGNOSIS — R972 Elevated prostate specific antigen [PSA]: Secondary | ICD-10-CM | POA: Diagnosis not present

## 2011-10-30 DIAGNOSIS — R42 Dizziness and giddiness: Secondary | ICD-10-CM | POA: Diagnosis not present

## 2011-11-04 DIAGNOSIS — D1801 Hemangioma of skin and subcutaneous tissue: Secondary | ICD-10-CM | POA: Diagnosis not present

## 2011-11-17 ENCOUNTER — Telehealth: Payer: Self-pay | Admitting: Gastroenterology

## 2011-11-17 MED ORDER — ESOMEPRAZOLE MAGNESIUM 40 MG PO CPDR: 40.0000 mg | DELAYED_RELEASE_CAPSULE | Freq: Every day | ORAL | Status: DC

## 2011-11-17 NOTE — Telephone Encounter (Signed)
lmom that I ordered is Nexium at [facility], but they have 2. I ordered it at [facility] because he lives in [place]. He may call back for questions.

## 2011-11-19 DIAGNOSIS — R972 Elevated prostate specific antigen [PSA]: Secondary | ICD-10-CM | POA: Diagnosis not present

## 2011-11-19 DIAGNOSIS — N400 Enlarged prostate without lower urinary tract symptoms: Secondary | ICD-10-CM | POA: Diagnosis not present

## 2011-11-25 NOTE — Telephone Encounter (Signed)
Already answered  

## 2011-11-26 ENCOUNTER — Ambulatory Visit: Payer: Managed Care, Other (non HMO) | Admitting: Family

## 2011-12-03 ENCOUNTER — Encounter: Payer: Self-pay | Admitting: Family

## 2011-12-03 ENCOUNTER — Ambulatory Visit (INDEPENDENT_AMBULATORY_CARE_PROVIDER_SITE_OTHER): Payer: Medicare Other | Admitting: Family

## 2011-12-03 VITALS — BP 148/92 | HR 66 | Temp 97.9°F | Resp 16 | Ht 66.0 in | Wt 201.1 lb

## 2011-12-03 DIAGNOSIS — I10 Essential (primary) hypertension: Secondary | ICD-10-CM

## 2011-12-03 DIAGNOSIS — J40 Bronchitis, not specified as acute or chronic: Secondary | ICD-10-CM | POA: Diagnosis not present

## 2011-12-03 DIAGNOSIS — K219 Gastro-esophageal reflux disease without esophagitis: Secondary | ICD-10-CM | POA: Diagnosis not present

## 2011-12-03 MED ORDER — AMLODIPINE BESYLATE 5 MG PO TABS: 5.0000 mg | ORAL_TABLET | Freq: Every day | ORAL | Status: DC

## 2011-12-03 MED ORDER — ALBUTEROL SULFATE HFA 108 (90 BASE) MCG/ACT IN AERS: 2.0000 | INHALATION_SPRAY | Freq: Four times a day (QID) | RESPIRATORY_TRACT | Status: DC | PRN

## 2011-12-03 MED ORDER — AZITHROMYCIN 250 MG PO TABS: ORAL_TABLET | ORAL | Status: AC

## 2011-12-03 NOTE — Patient Instructions (Addendum)
Please schedule a follow up appointment in 1 month.  Bronchitis Bronchitis is the body's way of reacting to injury and/or infection (inflammation) of the bronchi. Bronchi are the air tubes that extend from the windpipe into the lungs. If the inflammation becomes severe, it may cause shortness of breath. CAUSES  Inflammation may be caused by:  A virus.   Germs (bacteria).   Dust.   Allergens.   Pollutants and many other irritants.  The cells lining the bronchial tree are covered with tiny hairs (cilia). These constantly beat upward, away from the lungs, toward the mouth. This keeps the lungs free of pollutants. When these cells become too irritated and are unable to do their job, mucus begins to develop. This causes the characteristic cough of bronchitis. The cough clears the lungs when the cilia are unable to do their job. Without either of these protective mechanisms, the mucus would settle in the lungs. Then you would develop pneumonia. Smoking is a common cause of bronchitis and can contribute to pneumonia. Stopping this habit is the single most important thing you can do to help yourself. TREATMENT   Your caregiver may prescribe an antibiotic if the cough is caused by bacteria. Also, medicines that open up your airways make it easier to breathe. Your caregiver may also recommend or prescribe an expectorant. It will loosen the mucus to be coughed up. Only take over-the-counter or prescription medicines for pain, discomfort, or fever as directed by your caregiver.   Removing whatever causes the problem (smoking, for example) is critical to preventing the problem from getting worse.   Cough suppressants may be prescribed for relief of cough symptoms.   Inhaled medicines may be prescribed to help with symptoms now and to help prevent problems from returning.   For those with recurrent (chronic) bronchitis, there may be a need for steroid medicines.  SEEK IMMEDIATE MEDICAL CARE IF:    During treatment, you develop more pus-like mucus (purulent sputum).   You have a fever.   Your baby is older than 3 months with a rectal temperature of 102 F (38.9 C) or higher.   Your baby is 12 months old or younger with a rectal temperature of 100.4 F (38 C) or higher.   You become progressively more ill.   You have increased difficulty breathing, wheezing, or shortness of breath.  It is necessary to seek immediate medical care if you are elderly or sick from any other disease. MAKE SURE YOU:   Understand these instructions.   Will watch your condition.   Will get help right away if you are not doing well or get worse.  Document Released: 03/17/2005 Document Revised: 03/06/2011 Document Reviewed: 01/25/2008 [facility] Patient Information 2012 [place], [place].

## 2011-12-03 NOTE — Progress Notes (Signed)
Subjective:    Patient ID: Gerald [name], male    DOB: 1945-06-04, 66 y.o.   MRN: [id number]  HPI  Gerald [name] is a 66 yr old male who presents today for follow up of his hypertension and to discuss recent gerd flare.  HTN- pt is not currently taking medication.  [facility]   GERD- Pt recently switched to nexium from aciphex. Sunday night reports that he had a flare up of his reflux which he attributes to changing his medication. Pt woke up Monday and had rib soreness and some green sputum/cough.   Symptoms are improving.  He returned to aciphex which is helping.    Review of Systems See HPI  Past Medical History  Diagnosis Date  . Hypertension   . Hyperlipidemia   . 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 Alcohol abuse     quit 1989  . Hiatal hernia     EGD 3/10/3 stricture  . OSA (obstructive sleep apnea)     mild, per sleep study  . Onychomycosis   . Diverticulosis   . Hemorrhoids   . PVD (peripheral vascular disease)   . Fatty liver   . Hx of adenomatous colonic polyps   . GERD (gastroesophageal reflux disease)   . Esophageal stricture   . Insomnia   . Back pain, chronic     History   Social History  . Marital Status: Married    Spouse Name: N/A    Number of Children: 2  . Years of Education: N/A   Occupational History  . Retired    Social History Main Topics  . Smoking status: Former Smoker    Quit date: 08/29/2006  . Smokeless tobacco: Never Used  . Alcohol Use: No     quit alcohol 1989  . Drug Use: No  . Sexually Active: Not on file   Other Topics Concern  . Not on file   Social History Narrative   Diet: regular, lots of vegetables-----exercise : none but activeCaffeine use:  5 cups coffee daily    Past Surgical History  Procedure Date  . Tonsillectomy and adenoidectomy   . Cystectomy     Right leg remotely, back 2012  . Mass removal     left shoulder  .  Wisdom tooth extraction     Family History  Problem Relation Age of Onset  . Coronary artery disease Father   . Colon cancer Father   . Cancer Father     colon  . Heart attack Paternal Grandfather   . Coronary artery disease Maternal Grandmother   . Colon cancer Maternal Grandmother   . Cancer Maternal Grandmother     colon  . Hypertension Maternal Aunt   . Diabetes Neg Hx   . Prostate cancer Neg Hx   . Stroke Neg Hx   . Melanoma Mother   . Coronary artery disease Paternal Grandmother   . Heart attack Maternal Grandfather     Allergies  Allergen Reactions  . Penicillins     Current Outpatient Prescriptions on File Prior to Visit  Medication Sig Dispense Refill  . ACIPHEX 20 MG tablet TAKE 1 TABLET DAILY.  30 each  11  . alum & mag hydroxide-simeth (MYLANTA) 200-200-20 MG/5ML suspension Take by mouth every 6 (six) hours as needed.        Marland Kitchen aspirin 81 MG tablet Take 81 mg by mouth  every other day.      . meloxicam (MOBIC) 7.5 MG tablet Take 1 tablet (7.5 mg total) by mouth daily.  10 tablet  0  . Omega-3 Fatty Acids (FISH OIL) 1200 MG CAPS Take 1 capsule by mouth daily.      Marland Kitchen albuterol (PROVENTIL HFA;VENTOLIN HFA) 108 (90 BASE) MCG/ACT inhaler Inhale 2 puffs into the lungs every 6 (six) hours as needed for wheezing.  1 Inhaler  0  . amLODipine (NORVASC) 5 MG tablet Take 1 tablet (5 mg total) by mouth daily.  30 tablet  1    BP 148/92  Pulse 66  Temp 97.9 F (36.6 C) (Oral)  Resp 16  Ht 5\' 6"  (1.676 m)  Wt 201 lb 1.9 oz (91.227 kg)  BMI 32.46 kg/m2  SpO2 96%       Objective:   Physical Exam  Constitutional: He appears well-developed and well-nourished. No distress.  HENT:  Head: Normocephalic and atraumatic.  Right Ear: Tympanic membrane and ear canal normal.  Left Ear: Tympanic membrane and ear canal normal.  Cardiovascular: Normal rate and regular rhythm.   No murmur heard. Pulmonary/Chest: Effort normal. No respiratory distress. He has wheezes. He has no  rales. He exhibits no tenderness.  Lymphadenopathy:    He has no cervical adenopathy.  Skin: Skin is warm and dry.  Psychiatric: He has a normal mood and affect. His behavior is normal. Judgment and thought content normal.          Assessment & Plan:

## 2011-12-03 NOTE — Assessment & Plan Note (Signed)
Plan to rx with zithromax and albuterol MDI.

## 2011-12-03 NOTE — Assessment & Plan Note (Signed)
Improved now that he is back on aciphex.

## 2011-12-03 NOTE — Assessment & Plan Note (Signed)
Will rx with amlodipine and plan to have pt follow up in 1 month.

## 2011-12-08 ENCOUNTER — Telehealth: Payer: Self-pay | Admitting: Gastroenterology

## 2011-12-08 MED ORDER — RABEPRAZOLE SODIUM 20 MG PO TBEC: 20.0000 mg | DELAYED_RELEASE_TABLET | Freq: Every day | ORAL | Status: DC

## 2011-12-08 NOTE — Telephone Encounter (Signed)
lmom for pt to call back; need more info on PPI use.

## 2011-12-08 NOTE — Telephone Encounter (Signed)
Pt reports he wants to switch back to Aciphex from Nexium. He reports the Nexium did not help at all and he will put up with the SE of the Aciphex. Pt reports the Aciphex works well for him, he just gets a few muscle aches with it; or he says it might be from being 66! Reordered the Aciphex for pt.

## 2012-01-01 DIAGNOSIS — Q828 Other specified congenital malformations of skin: Secondary | ICD-10-CM | POA: Diagnosis not present

## 2012-01-01 DIAGNOSIS — L219 Seborrheic dermatitis, unspecified: Secondary | ICD-10-CM | POA: Diagnosis not present

## 2012-01-01 DIAGNOSIS — L819 Disorder of pigmentation, unspecified: Secondary | ICD-10-CM | POA: Diagnosis not present

## 2012-01-01 DIAGNOSIS — L570 Actinic keratosis: Secondary | ICD-10-CM | POA: Diagnosis not present

## 2012-01-01 DIAGNOSIS — L259 Unspecified contact dermatitis, unspecified cause: Secondary | ICD-10-CM | POA: Diagnosis not present

## 2012-01-01 DIAGNOSIS — L821 Other seborrheic keratosis: Secondary | ICD-10-CM | POA: Diagnosis not present

## 2012-01-06 ENCOUNTER — Encounter: Payer: Self-pay | Admitting: Family

## 2012-01-06 ENCOUNTER — Ambulatory Visit (INDEPENDENT_AMBULATORY_CARE_PROVIDER_SITE_OTHER): Payer: Medicare Other | Admitting: Family

## 2012-01-06 VITALS — BP 146/80 | HR 72 | Temp 98.2°F | Resp 18 | Ht 66.0 in | Wt 205.1 lb

## 2012-01-06 DIAGNOSIS — Z23 Encounter for immunization: Secondary | ICD-10-CM

## 2012-01-06 DIAGNOSIS — I10 Essential (primary) hypertension: Secondary | ICD-10-CM

## 2012-01-06 MED ORDER — AMLODIPINE BESYLATE 10 MG PO TABS: 10.0000 mg | ORAL_TABLET | Freq: Every day | ORAL | Status: DC

## 2012-01-06 NOTE — Assessment & Plan Note (Signed)
BP is improving but not yet at goal.  Will increase amlodipine from 5mg  to 10mg .  Follow up in 1 month.

## 2012-01-06 NOTE — Progress Notes (Signed)
Subjective:    Patient ID: Gerald [name], male    DOB: 1945/05/15, 66 y.o.   MRN: [id number]  HPI  Gerald [name] is a 66 yr old male who presents today for follow up of his HTN.  Last visit amlodipine was added. He reports that he is tolerating the medication without any problems. Specifically, he denies SOB, LE edema or chest pain.  BP Readings from Last 3 Encounters:  01/06/12 146/80  12/03/11 148/92  09/01/11 140/90    Review of Systems    see HPI  Past Medical History  Diagnosis Date  . Hypertension   . Hyperlipidemia   . 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 Alcohol abuse     quit 1989  . Hiatal hernia     EGD 3/10/3 stricture  . OSA (obstructive sleep apnea)     mild, per sleep study  . Onychomycosis   . Diverticulosis   . Hemorrhoids   . PVD (peripheral vascular disease)   . Fatty liver   . Hx of adenomatous colonic polyps   . GERD (gastroesophageal reflux disease)   . Esophageal stricture   . Insomnia   . Back pain, chronic     History   Social History  . Marital Status: Married    Spouse Name: N/A    Number of Children: 2  . Years of Education: N/A   Occupational History  . Retired    Social History Main Topics  . Smoking status: Former Smoker    Quit date: 08/29/2006  . Smokeless tobacco: Never Used  . Alcohol Use: No     quit alcohol 1989  . Drug Use: No  . Sexually Active: Not on file   Other Topics Concern  . Not on file   Social History Narrative   Diet: regular, lots of vegetables-----exercise : none but activeCaffeine use:  5 cups coffee daily    Past Surgical History  Procedure Date  . Tonsillectomy and adenoidectomy   . Cystectomy     Right leg remotely, back 2012  . Mass removal     left shoulder  . Wisdom tooth extraction     Family History  Problem Relation Age of Onset  . Coronary artery disease Father   . Colon cancer Father   . Cancer Father     colon  . Heart attack Paternal  Grandfather   . Coronary artery disease Maternal Grandmother   . Colon cancer Maternal Grandmother   . Cancer Maternal Grandmother     colon  . Hypertension Maternal Aunt   . Diabetes Neg Hx   . Prostate cancer Neg Hx   . Stroke Neg Hx   . Melanoma Mother   . Coronary artery disease Paternal Grandmother   . Heart attack Maternal Grandfather     Allergies  Allergen Reactions  . Penicillins     Current Outpatient Prescriptions on File Prior to Visit  Medication Sig Dispense Refill  . alum & mag hydroxide-simeth (MYLANTA) 200-200-20 MG/5ML suspension Take by mouth every 6 (six) hours as needed.        Marland Kitchen aspirin 81 MG tablet Take 81 mg by mouth every other day.      . ketoconazole (NIZORAL) 2 % shampoo Apply topically as needed. For eczema      . meclizine (ANTIVERT) 25 MG tablet Take 25 mg by mouth as needed.      . meloxicam (MOBIC) 7.5 MG tablet Take  1 tablet (7.5 mg total) by mouth daily.  10 tablet  0  . Omega-3 Fatty Acids (FISH OIL) 1200 MG CAPS Take 1 capsule by mouth daily.      . RABEprazole (ACIPHEX) 20 MG tablet Take 1 tablet (20 mg total) by mouth daily.  30 tablet  6  . DISCONTD: amLODipine (NORVASC) 5 MG tablet Take 1 tablet (5 mg total) by mouth daily.  30 tablet  1  . albuterol (PROVENTIL HFA;VENTOLIN HFA) 108 (90 BASE) MCG/ACT inhaler Inhale 2 puffs into the lungs every 6 (six) hours as needed for wheezing.  1 Inhaler  0    BP 146/80  Pulse 72  Temp 98.2 F (36.8 C) (Oral)  Resp 18  Ht 5\' 6"  (1.676 m)  Wt 205 lb 1.3 oz (93.024 kg)  BMI 33.10 kg/m2  SpO2 98%    Objective:   Physical Exam  Constitutional: He appears well-developed and well-nourished. No distress.  Cardiovascular: Normal rate and regular rhythm.   No murmur heard. Pulmonary/Chest: Effort normal and breath sounds normal. No respiratory distress. He has no wheezes. He has no rales. He exhibits no tenderness.  Psychiatric: He has a normal mood and affect. His behavior is normal. Judgment and  thought content normal.          Assessment & Plan:

## 2012-01-06 NOTE — Addendum Note (Signed)
Addended by: Mervin Kung A on: 01/06/2012 05:16 PM   Modules accepted: Orders

## 2012-01-06 NOTE — Patient Instructions (Addendum)
Please schedule a follow up appointment in 1 month.

## 2012-02-03 ENCOUNTER — Ambulatory Visit: Payer: Medicare Other | Admitting: Family

## 2012-02-04 ENCOUNTER — Encounter: Payer: Self-pay | Admitting: Family

## 2012-02-04 ENCOUNTER — Ambulatory Visit (INDEPENDENT_AMBULATORY_CARE_PROVIDER_SITE_OTHER): Payer: Medicare Other | Admitting: Family

## 2012-02-04 VITALS — BP 134/74 | HR 73 | Temp 99.3°F | Resp 16 | Ht 66.0 in | Wt 203.0 lb

## 2012-02-04 DIAGNOSIS — I10 Essential (primary) hypertension: Secondary | ICD-10-CM

## 2012-02-04 DIAGNOSIS — H9319 Tinnitus, unspecified ear: Secondary | ICD-10-CM | POA: Diagnosis not present

## 2012-02-04 MED ORDER — AMLODIPINE BESYLATE 10 MG PO TABS: 10.0000 mg | ORAL_TABLET | Freq: Every day | ORAL | Status: DC

## 2012-02-04 NOTE — Progress Notes (Signed)
Subjective:    Patient ID: Gerald [name], male    DOB: 08/22/1945, 66 y.o.   MRN: [id number]  HPI  [name] is a 66 yr old male who presents today for follow up of his htn.  Last visit his amlodipine was increased from 5mg  to 10mg .  Denies LE edema, CP or SOB.  Buzzing in his ears- almost sounds like static.   Finds it bothersome, intermittent.    Review of Systems    see HPI  Past Medical History  Diagnosis Date  . Hypertension   . Hyperlipidemia   . 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 Alcohol abuse     quit 1989  . Hiatal hernia     EGD 3/10/3 stricture  . OSA (obstructive sleep apnea)     mild, per sleep study  . Onychomycosis   . Diverticulosis   . Hemorrhoids   . PVD (peripheral vascular disease)   . Fatty liver   . Hx of adenomatous colonic polyps   . GERD (gastroesophageal reflux disease)   . Esophageal stricture   . Insomnia   . Back pain, chronic     History   Social History  . Marital Status: Married    Spouse Name: N/A    Number of Children: 2  . Years of Education: N/A   Occupational History  . Retired    Social History Main Topics  . Smoking status: Former Smoker    Quit date: 08/29/2006  . Smokeless tobacco: Never Used  . Alcohol Use: No     Comment: quit alcohol 1989  . Drug Use: No  . Sexually Active: Not on file   Other Topics Concern  . Not on file   Social History Narrative   Diet: regular, lots of vegetables-----exercise : none but activeCaffeine use:  5 cups coffee daily    Past Surgical History  Procedure Date  . Tonsillectomy and adenoidectomy   . Cystectomy     Right leg remotely, back 2012  . Mass removal     left shoulder  . Wisdom tooth extraction     Family History  Problem Relation Age of Onset  . Coronary artery disease Father   . Colon cancer Father   . Cancer Father     colon  . Heart attack Paternal Grandfather   . Coronary artery disease Maternal Grandmother   .  Colon cancer Maternal Grandmother   . Cancer Maternal Grandmother     colon  . Hypertension Maternal Aunt   . Diabetes Neg Hx   . Prostate cancer Neg Hx   . Stroke Neg Hx   . Melanoma Mother   . Coronary artery disease Paternal Grandmother   . Heart attack Maternal Grandfather     Allergies  Allergen Reactions  . Penicillins     Current Outpatient Prescriptions on File Prior to Visit  Medication Sig Dispense Refill  . alum & mag hydroxide-simeth (MYLANTA) 200-200-20 MG/5ML suspension Take by mouth every 6 (six) hours as needed.        Marland Kitchen amLODipine (NORVASC) 10 MG tablet Take 1 tablet (10 mg total) by mouth daily.  30 tablet  0  . aspirin 81 MG tablet Take 81 mg by mouth every other day.      . ketoconazole (NIZORAL) 2 % shampoo Apply topically as needed. For eczema      . meclizine (ANTIVERT) 25 MG tablet Take 25 mg by mouth as  needed.      . meloxicam (MOBIC) 7.5 MG tablet Take 1 tablet (7.5 mg total) by mouth daily.  10 tablet  0  . Omega-3 Fatty Acids (FISH OIL) 1200 MG CAPS Take 1 capsule by mouth daily.      . RABEprazole (ACIPHEX) 20 MG tablet Take 1 tablet (20 mg total) by mouth daily.  30 tablet  6    BP 134/74  Pulse 73  Temp 99.3 F (37.4 C) (Oral)  Resp 16  Ht 5\' 6"  (1.676 m)  Wt 203 lb (92.08 kg)  BMI 32.76 kg/m2  SpO2 99%    Objective:   Physical Exam  Constitutional: He appears well-developed and well-nourished. No distress.  HENT:  Head: Normocephalic and atraumatic.  Right Ear: Tympanic membrane and ear canal normal.  Left Ear: Tympanic membrane and ear canal normal.  Mouth/Throat: No posterior oropharyngeal edema or posterior oropharyngeal erythema.  Cardiovascular: Normal rate and regular rhythm.   No murmur heard. Pulmonary/Chest: Effort normal and breath sounds normal. No respiratory distress. He has no wheezes. He has no rales. He exhibits no tenderness.  Musculoskeletal: He exhibits no edema.  Psychiatric: He has a normal mood and affect. His  behavior is normal. Judgment and thought content normal.          Assessment & Plan:

## 2012-02-04 NOTE — Patient Instructions (Addendum)
Please follow up in 3 months.  

## 2012-02-04 NOTE — Assessment & Plan Note (Signed)
BP Readings from Last 3 Encounters:  02/04/12 134/74  01/06/12 146/80  12/03/11 148/92   BP is improved and at goal. Continue current dose of amlodipine.

## 2012-02-09 DIAGNOSIS — H9319 Tinnitus, unspecified ear: Secondary | ICD-10-CM | POA: Insufficient documentation

## 2012-02-09 NOTE — Assessment & Plan Note (Signed)
Ear exam is normal.  We discussed that there is no cure for this.  Handout given to pt on tinnitus.

## 2012-02-27 ENCOUNTER — Telehealth: Payer: Self-pay | Admitting: Family

## 2012-02-27 NOTE — Telephone Encounter (Signed)
LMOM with contact name & number RE:BP medication [amlodipine], urged pt to resume medication until Monday, 12.02.13, when provider will return to office & if not resuming to please keep BP log [min 2-3 times daily], also to report to urgent care facility for A&E if exhibiting any HTN symptoms [dizziness and.or lightheadedness, blurred vision, H/A, nausea & vomiting, pain in jaw, arm and/or chest]/SLS

## 2012-02-27 NOTE — Telephone Encounter (Signed)
PATIENT WAS PUT ON AMLODIPINE BESYLATE 10MG  TAKE 1 DAILY.  HE QUIT TAKING IT TWO WEEKS AGO BECAUSE IT WAS MAKING HIM FEEL LAZY AND ACHEY.  HE FEELS MUCH BETTER BUT NOW NEEDS A NEW MED TO TAKE THE PLACE OF THIS ONE.

## 2012-03-01 MED ORDER — LISINOPRIL 10 MG PO TABS: 10.0000 mg | ORAL_TABLET | Freq: Every day | ORAL | Status: DC

## 2012-03-01 NOTE — Telephone Encounter (Signed)
Notified pt and he voices understanding. He will take 1/2 of his 10mg  amlodipine until he completed current supply and then will call us for refill. Follow up scheduled for 03/16/12 at 11am.

## 2012-03-01 NOTE — Telephone Encounter (Signed)
Please call pt and let him know that I would like him to drop his amlodipine back to 5mg  once daily and add in lisinopril 10mg  once daily. Follow back up in 2 weeks.

## 2012-03-16 ENCOUNTER — Ambulatory Visit: Payer: Medicare Other | Admitting: Family

## 2012-03-19 ENCOUNTER — Encounter: Payer: Self-pay | Admitting: Family

## 2012-03-19 ENCOUNTER — Ambulatory Visit (INDEPENDENT_AMBULATORY_CARE_PROVIDER_SITE_OTHER): Payer: Medicare Other | Admitting: Family

## 2012-03-19 VITALS — BP 180/100 | HR 63 | Temp 98.4°F | Resp 16 | Ht 66.0 in | Wt 200.1 lb

## 2012-03-19 DIAGNOSIS — I10 Essential (primary) hypertension: Secondary | ICD-10-CM

## 2012-03-19 MED ORDER — OLMESARTAN MEDOXOMIL 20 MG PO TABS: 20.0000 mg | ORAL_TABLET | Freq: Every day | ORAL | Status: DC

## 2012-03-19 NOTE — Patient Instructions (Addendum)
Please follow up in 2 weeks

## 2012-03-19 NOTE — Progress Notes (Signed)
Subjective:    Patient ID: Gerald [name], male    DOB: [birth year], 66 y.o.   MRN: [id number]  HPI  Gerald [name] is a 66 yr old male who presents today for follow up of his blood pressure. He reports that he stopped his amlodipine and lisinopril due to side effects of fatigue and myalgia. Upon stopping these medications, symptoms resolved.    Review of Systems See HPI    Objective:   Physical Exam  Constitutional: He appears well-developed and well-nourished. No distress.  Cardiovascular: Normal rate and regular rhythm.   No murmur heard. Pulmonary/Chest: Effort normal and breath sounds normal. No respiratory distress. He has no wheezes. He has no rales. He exhibits no tenderness.  Musculoskeletal: He exhibits no edema.  Psychiatric: He has a normal mood and affect. His behavior is normal. Judgment and thought content normal.          Assessment & Plan:

## 2012-03-19 NOTE — Assessment & Plan Note (Signed)
Deteriorated.  Trial of benicar.  Follow up in 2 weeks.  15 minutes spent with pt. >50% of this time was spent counseling pt on risks of uncontrolled HTN, he is agreeable to try a new medicaiton.

## 2012-04-05 ENCOUNTER — Encounter: Payer: Self-pay | Admitting: Family

## 2012-04-05 ENCOUNTER — Ambulatory Visit (INDEPENDENT_AMBULATORY_CARE_PROVIDER_SITE_OTHER): Payer: Medicare Other | Admitting: Family

## 2012-04-05 VITALS — BP 150/90 | HR 69 | Temp 97.9°F | Resp 16 | Ht 66.0 in | Wt 206.0 lb

## 2012-04-05 DIAGNOSIS — I10 Essential (primary) hypertension: Secondary | ICD-10-CM | POA: Diagnosis not present

## 2012-04-05 MED ORDER — OLMESARTAN MEDOXOMIL 40 MG PO TABS: 40.0000 mg | ORAL_TABLET | Freq: Every day | ORAL | Status: DC

## 2012-04-05 NOTE — Assessment & Plan Note (Signed)
Improving but not yet at goal.  I recommended that patient increase benicar from 20mg  to 40 mg and follow up in 1 week for BP check and bmet.

## 2012-04-05 NOTE — Progress Notes (Signed)
Subjective:    Patient ID: Gerald [name], male    DOB: 1945/07/31, 67 y.o.   MRN: [id number]  HPI  [name] is a 67 yr old male who presents today for follow up of his HTN.  Last visit he was started on Benicar 20mg .  He reports that he has been tolerating this medication without side effects.   Review of Systems See HPI  Past Medical History  Diagnosis Date  . Hypertension   . Hyperlipidemia   . 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 Alcohol abuse     quit 1989  . Hiatal hernia     EGD 3/10/3 stricture  . OSA (obstructive sleep apnea)     mild, per sleep study  . Onychomycosis   . Diverticulosis   . Hemorrhoids   . PVD (peripheral vascular disease)   . Fatty liver   . Hx of adenomatous colonic polyps   . GERD (gastroesophageal reflux disease)   . Esophageal stricture   . Insomnia   . Back pain, chronic     History   Social History  . Marital Status: Married    Spouse Name: N/A    Number of Children: 2  . Years of Education: N/A   Occupational History  . Retired    Social History Main Topics  . Smoking status: Former Smoker    Quit date: 08/29/2006  . Smokeless tobacco: Never Used  . Alcohol Use: No     Comment: quit alcohol 1989  . Drug Use: No  . Sexually Active: Not on file   Other Topics Concern  . Not on file   Social History Narrative   Diet: regular, lots of vegetables-----exercise : none but activeCaffeine use:  5 cups coffee daily    Past Surgical History  Procedure Date  . Tonsillectomy and adenoidectomy   . Cystectomy     Right leg remotely, back 2012  . Mass removal     left shoulder  . Wisdom tooth extraction     Family History  Problem Relation Age of Onset  . Coronary artery disease Father   . Colon cancer Father   . Cancer Father     colon  . Heart attack Paternal Grandfather   . Coronary artery disease Maternal Grandmother   . Colon cancer Maternal Grandmother   . Cancer Maternal  Grandmother     colon  . Hypertension Maternal Aunt   . Diabetes Neg Hx   . Prostate cancer Neg Hx   . Stroke Neg Hx   . Melanoma Mother   . Coronary artery disease Paternal Grandmother   . Heart attack Maternal Grandfather     Allergies  Allergen Reactions  . Penicillins     Current Outpatient Prescriptions on File Prior to Visit  Medication Sig Dispense Refill  . aspirin 81 MG tablet Take 81 mg by mouth every other day.      . ketoconazole (NIZORAL) 2 % shampoo Apply topically as needed. For eczema      . meclizine (ANTIVERT) 25 MG tablet Take 25 mg by mouth as needed.      . Omega-3 Fatty Acids (FISH OIL) 1200 MG CAPS Take 1 capsule by mouth daily.      . RABEprazole (ACIPHEX) 20 MG tablet Take 1 tablet (20 mg total) by mouth daily.  30 tablet  6  . olmesartan (BENICAR) 40 MG tablet Take 1 tablet (40 mg total) by mouth  daily.  14 tablet  0    BP 150/90  Pulse 69  Temp 97.9 F (36.6 C) (Oral)  Resp 16  Ht 5\' 6"  (1.676 m)  Wt 206 lb (93.441 kg)  BMI 33.25 kg/m2  SpO2 97%       Objective:   Physical Exam  Constitutional: He is oriented to person, place, and time. He appears well-developed and well-nourished. No distress.  HENT:  Head: Normocephalic and atraumatic.  Cardiovascular: Normal rate and regular rhythm.   No murmur heard. Pulmonary/Chest: Effort normal and breath sounds normal. No respiratory distress. He has no wheezes. He has no rales. He exhibits no tenderness.  Musculoskeletal: He exhibits no edema.  Neurological: He is alert and oriented to person, place, and time.  Psychiatric: He has a normal mood and affect. His behavior is normal. Judgment and thought content normal.          Assessment & Plan:   BP Readings from Last 3 Encounters:  04/05/12 150/90  03/19/12 180/100  02/04/12 134/74

## 2012-04-12 ENCOUNTER — Encounter: Payer: Self-pay | Admitting: Family

## 2012-04-12 ENCOUNTER — Ambulatory Visit (INDEPENDENT_AMBULATORY_CARE_PROVIDER_SITE_OTHER): Payer: Medicare Other | Admitting: Family

## 2012-04-12 VITALS — BP 154/90 | HR 75 | Temp 97.8°F | Resp 16 | Ht 66.0 in | Wt 205.1 lb

## 2012-04-12 DIAGNOSIS — I10 Essential (primary) hypertension: Secondary | ICD-10-CM

## 2012-04-12 LAB — BASIC METABOLIC PANEL WITH GFR
BUN: 16 mg/dL (ref 6–23)
CO2: 28 meq/L (ref 19–32)
Calcium: 9.5 mg/dL (ref 8.4–10.5)
Chloride: 101 meq/L (ref 96–112)
Creat: 0.93 mg/dL (ref 0.50–1.35)
Glucose, Bld: 89 mg/dL (ref 70–99)
Potassium: 4.1 meq/L (ref 3.5–5.3)
Sodium: 139 meq/L (ref 135–145)

## 2012-04-12 MED ORDER — OLMESARTAN MEDOXOMIL 40 MG PO TABS: 40.0000 mg | ORAL_TABLET | Freq: Every day | ORAL | Status: DC

## 2012-04-12 MED ORDER — NEBIVOLOL HCL 10 MG PO TABS: 10.0000 mg | ORAL_TABLET | Freq: Every day | ORAL | Status: DC

## 2012-04-12 NOTE — Patient Instructions (Signed)
Please complete your lab work prior to leaving.  Follow up in 2 weeks.

## 2012-04-12 NOTE — Progress Notes (Signed)
Subjective:    Patient ID: Gerald [name], male    DOB: 1945-04-02, 67 y.o.   MRN: [id number]  HPI  Mr.Sweezey is a 67 yr old male who presents today for follow up of his blood pressure.  Last visit benicar was increased from 20mg  to 40mg . Tolerating med. Denies CP/SOB/Swelling.   Review of Systems     Objective:   Physical Exam  Constitutional: He appears well-developed and well-nourished. No distress.  Cardiovascular: Normal rate and regular rhythm.   No murmur heard. Pulmonary/Chest: Effort normal and breath sounds normal.  Psychiatric: He has a normal mood and affect. His behavior is normal. Judgment and thought content normal.          Assessment & Plan:   BP Readings from Last 3 Encounters:  04/12/12 154/90  04/05/12 150/90  03/19/12 180/100

## 2012-04-13 ENCOUNTER — Encounter: Payer: Self-pay | Admitting: Family

## 2012-04-14 NOTE — Assessment & Plan Note (Addendum)
Remains uncontrolled. Continue benicar 40mg .  Obtain bmet.  Add bystolic.

## 2012-04-26 ENCOUNTER — Ambulatory Visit (INDEPENDENT_AMBULATORY_CARE_PROVIDER_SITE_OTHER): Payer: Medicare Other | Admitting: Family

## 2012-04-26 ENCOUNTER — Encounter: Payer: Self-pay | Admitting: Family

## 2012-04-26 VITALS — BP 150/84 | HR 56 | Temp 99.5°F | Resp 16 | Ht 66.0 in | Wt 205.0 lb

## 2012-04-26 DIAGNOSIS — I10 Essential (primary) hypertension: Secondary | ICD-10-CM | POA: Diagnosis not present

## 2012-04-26 MED ORDER — NEBIVOLOL HCL 20 MG PO TABS: ORAL_TABLET | ORAL | Status: DC

## 2012-04-26 MED ORDER — OLMESARTAN MEDOXOMIL 40 MG PO TABS: 40.0000 mg | ORAL_TABLET | Freq: Every day | ORAL | Status: DC

## 2012-04-26 NOTE — Assessment & Plan Note (Signed)
BP remains above goal.  Increase bystolic from 10 to 20mg .  #28 samples of benicar was provided to pt today.

## 2012-04-26 NOTE — Patient Instructions (Addendum)
Please follow up in 2 weeks

## 2012-04-26 NOTE — Progress Notes (Signed)
Subjective:    Patient ID: Gerald [name], male    DOB: 01-Apr-1945, 67 y.o.   MRN: [id number]  HPI  [name] is a 67 yr old male who presents today for follow up of his blood pressure.  Last visit he was placed on bystolic.  He notes that he is tolerating bystolic without difficulty.  Reports + compliance with his medications.  Review of Systems     Objective:   Physical Exam  Constitutional: He is oriented to person, place, and time. He appears well-developed and well-nourished. No distress.  Neurological: He is alert and oriented to person, place, and time.  Psychiatric: He has a normal mood and affect. His behavior is normal. Judgment and thought content normal.          Assessment & Plan:

## 2012-05-04 ENCOUNTER — Ambulatory Visit: Payer: Medicare Other | Admitting: Family

## 2012-05-10 ENCOUNTER — Ambulatory Visit (HOSPITAL_BASED_OUTPATIENT_CLINIC_OR_DEPARTMENT_OTHER)
Admission: RE | Admit: 2012-05-10 | Discharge: 2012-05-10 | Disposition: A | Discharge status: Home / Self Care | Payer: Medicare Other | Source: Ambulatory Visit | Attending: Family | Admitting: Family

## 2012-05-10 ENCOUNTER — Ambulatory Visit (INDEPENDENT_AMBULATORY_CARE_PROVIDER_SITE_OTHER): Payer: Medicare Other | Admitting: Family

## 2012-05-10 ENCOUNTER — Encounter: Payer: Self-pay | Admitting: Family

## 2012-05-10 VITALS — BP 130/80 | HR 54 | Temp 98.5°F | Resp 16 | Ht 66.0 in | Wt 205.0 lb

## 2012-05-10 DIAGNOSIS — I10 Essential (primary) hypertension: Secondary | ICD-10-CM | POA: Diagnosis not present

## 2012-05-10 DIAGNOSIS — R059 Cough, unspecified: Secondary | ICD-10-CM

## 2012-05-10 DIAGNOSIS — R05 Cough: Secondary | ICD-10-CM | POA: Insufficient documentation

## 2012-05-10 DIAGNOSIS — R062 Wheezing: Secondary | ICD-10-CM | POA: Diagnosis not present

## 2012-05-10 MED ORDER — AZITHROMYCIN 250 MG PO TABS: ORAL_TABLET | ORAL | Status: DC

## 2012-05-10 MED ORDER — ALBUTEROL SULFATE HFA 108 (90 BASE) MCG/ACT IN AERS: 2.0000 | INHALATION_SPRAY | Freq: Four times a day (QID) | RESPIRATORY_TRACT | Status: DC | PRN

## 2012-05-10 MED ORDER — HYDROCOD POLST-CHLORPHEN POLST 10-8 MG/5ML PO LQCR: 5.0000 mL | Freq: Every evening | ORAL | Status: DC | PRN

## 2012-05-10 NOTE — Assessment & Plan Note (Signed)
BP is finally at goal.  Mild bradycardia is noted on bystolic, however pt is tolerating and asymptomatic. Continue to monitor.

## 2012-05-10 NOTE — Progress Notes (Signed)
Subjective:    Patient ID: Gerald [name], male    DOB: 12/01/1945, 67 y.o.   MRN: [id number]  HPI  Gerald [name] is a 67 yr old male who presents today for follow up of his HTN.  Last visit his bystolic was increased form 10 mg to 20mg .    Cough x 1 week + sputum.  Green.  Denies fever. Some sweating.  He reports some sinus congestion.   Review of Systems See HPI  Past Medical History  Diagnosis Date  . Hypertension   . Hyperlipidemia   . 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 Alcohol abuse     quit 1989  . Hiatal hernia     EGD 3/10/3 stricture  . OSA (obstructive sleep apnea)     mild, per sleep study  . Onychomycosis   . Diverticulosis   . Hemorrhoids   . PVD (peripheral vascular disease)   . Fatty liver   . Hx of adenomatous colonic polyps   . GERD (gastroesophageal reflux disease)   . Esophageal stricture   . Insomnia   . Back pain, chronic     History   Social History  . Marital Status: Married    Spouse Name: N/A    Number of Children: 2  . Years of Education: N/A   Occupational History  . Retired    Social History Main Topics  . Smoking status: Former Smoker    Quit date: 08/29/2006  . Smokeless tobacco: Never Used  . Alcohol Use: No     Comment: quit alcohol 1989  . Drug Use: No  . Sexually Active: Not on file   Other Topics Concern  . Not on file   Social History Narrative   Diet: regular, lots of vegetables-----exercise : none but active   Caffeine use:  5 cups coffee daily          Past Surgical History  Procedure Laterality Date  . Tonsillectomy and adenoidectomy    . Cystectomy      Right leg remotely, back 2012  . Mass removal      left shoulder  . Wisdom tooth extraction      Family History  Problem Relation Age of Onset  . Coronary artery disease Father   . Colon cancer Father   . Cancer Father     colon  . Heart attack Paternal Grandfather   . Coronary artery disease Maternal  Grandmother   . Colon cancer Maternal Grandmother   . Cancer Maternal Grandmother     colon  . Hypertension Maternal Aunt   . Diabetes Neg Hx   . Prostate cancer Neg Hx   . Stroke Neg Hx   . Melanoma Mother   . Coronary artery disease Paternal Grandmother   . Heart attack Maternal Grandfather     Allergies  Allergen Reactions  . Penicillins     Current Outpatient Prescriptions on File Prior to Visit  Medication Sig Dispense Refill  . aspirin 81 MG tablet Take 81 mg by mouth every other day.      . ketoconazole (NIZORAL) 2 % shampoo Apply topically as needed. For eczema      . meclizine (ANTIVERT) 25 MG tablet Take 25 mg by mouth as needed.      . Nebivolol HCl (BYSTOLIC) 20 MG TABS 1 tablet by mouth once daily  30 tablet  3  . olmesartan (BENICAR) 40 MG tablet Take 1 tablet (40  mg total) by mouth daily.  30 tablet  2  . Omega-3 Fatty Acids (FISH OIL) 1200 MG CAPS Take 1 capsule by mouth daily.      . RABEprazole (ACIPHEX) 20 MG tablet Take 1 tablet (20 mg total) by mouth daily.  30 tablet  6   No current facility-administered medications on file prior to visit.    BP 130/80  Pulse 54  Temp(Src) 98.5 F (36.9 C) (Oral)  Resp 16  Ht 5\' 6"  (1.676 m)  Wt 205 lb 0.6 oz (93.006 kg)  BMI 33.11 kg/m2  SpO2 98%       Objective:   Physical Exam  Constitutional: He appears well-developed and well-nourished. No distress.  Cardiovascular: Normal rate and regular rhythm.   No murmur heard. Pulmonary/Chest: Effort normal. He has no decreased breath sounds. He has wheezes in the right upper field. He has no rhonchi. He has no rales.  Musculoskeletal: He exhibits no edema.  Psychiatric: He has a normal mood and affect. His behavior is normal. Judgment and thought content normal.          Assessment & Plan:

## 2012-05-10 NOTE — Assessment & Plan Note (Signed)
Likely bronchitis with bronchospasm. Will rx with albuterol mdi, zpak, Tussionex HS prn.  Obtain CXR to exclude pneumonia.

## 2012-05-10 NOTE — Patient Instructions (Addendum)
Please complete your x ray on the first floor. Call if cough worsens, or if not resolved in 1 week. Please schedule a follow up appointment in 3 months.

## 2012-05-11 ENCOUNTER — Telehealth: Payer: Self-pay | Admitting: *Deleted

## 2012-05-11 NOTE — Telephone Encounter (Signed)
Pt called requesting samples of Bystolic 20mg . Pt is requesting 14 tablets. Advised pt that we are currently out and to check with [place] on Monday. Pt voices understanding.

## 2012-05-18 DIAGNOSIS — R972 Elevated prostate specific antigen [PSA]: Secondary | ICD-10-CM | POA: Diagnosis not present

## 2012-05-18 NOTE — Telephone Encounter (Signed)
Pt called to check status of sample request. Advised pt that we still have not received samples. Advised pt he may want to check back around the first of next week.

## 2012-05-24 DIAGNOSIS — R972 Elevated prostate specific antigen [PSA]: Secondary | ICD-10-CM | POA: Diagnosis not present

## 2012-05-26 ENCOUNTER — Telehealth: Payer: Self-pay | Admitting: *Deleted

## 2012-05-26 NOTE — Telephone Encounter (Signed)
Pt left message stating BP reading at urology office yesterday was 176/80s. He has not been checking BP at home. Advised pt he may check BP periodically at pharmacy and if he continues to have elevated readings to let us know. Pt has follow up in May and last reading in our office was 130/80.  Please advise if there are further instructions.

## 2012-05-26 NOTE — Telephone Encounter (Signed)
I would like him to repeat BP and contact us with result please.

## 2012-05-26 NOTE — Telephone Encounter (Signed)
Left detaiiled message with pt's wife and she voices understanding.

## 2012-07-26 ENCOUNTER — Telehealth: Payer: Self-pay | Admitting: *Deleted

## 2012-07-26 NOTE — Telephone Encounter (Signed)
Notified pt. He will contact the Health Dept first if no further recommendations are required he will contact us for tdap. If further recommendations are required he will try to get all needed vaccines through the health dept.

## 2012-07-26 NOTE — Telephone Encounter (Signed)
He is due for Tdap.  There is no immunization for hep C.  There is a shot for hepatitis A and B.  I recommend that he contact the health department to see what recommendations are recommended for the area he will be travelling to.

## 2012-07-26 NOTE — Telephone Encounter (Signed)
Received message from pt stating he and his wife will be travelling to [place] on 08/03/12. Wants to know if tdap is up to date and if he should have a ?hepatitis C shot? Per our records, last tetanus was 2003 so pt is due for tdap.  Please advise.

## 2012-08-12 ENCOUNTER — Other Ambulatory Visit: Payer: Self-pay | Admitting: Gastroenterology

## 2012-08-12 NOTE — Telephone Encounter (Signed)
PATIENT WILL NEED AN OFFICE VISIT FOR FURTHER REFILLS  

## 2012-08-17 ENCOUNTER — Telehealth: Payer: Self-pay | Admitting: Family

## 2012-08-17 MED ORDER — OLMESARTAN MEDOXOMIL 40 MG PO TABS: 40.0000 mg | ORAL_TABLET | Freq: Every day | ORAL | Status: DC

## 2012-08-17 MED ORDER — NEBIVOLOL HCL 20 MG PO TABS: ORAL_TABLET | ORAL | Status: DC

## 2012-08-17 NOTE — Telephone Encounter (Signed)
Refill- benicar 40mg  tablets. Take one tablet by mouth once daily. Qty 30 last fill 4.18.14  Refill- bystolic 20mg  tablets. Take one tablet by mouth every day. Qty 30 last fill 4.19.14

## 2012-08-24 ENCOUNTER — Ambulatory Visit (INDEPENDENT_AMBULATORY_CARE_PROVIDER_SITE_OTHER): Payer: Medicare Other | Admitting: Family

## 2012-08-24 ENCOUNTER — Encounter: Payer: Self-pay | Admitting: Family

## 2012-08-24 VITALS — BP 154/84 | HR 51 | Temp 98.3°F | Resp 16 | Ht 66.0 in | Wt 204.0 lb

## 2012-08-24 DIAGNOSIS — I10 Essential (primary) hypertension: Secondary | ICD-10-CM

## 2012-08-24 NOTE — Assessment & Plan Note (Addendum)
BP Readings from Last 3 Encounters:  08/24/12 154/84  05/10/12 130/80  04/26/12 150/84   BP above goal. We discussed adding amlodipine.  He declines additional medication and wants to "give it some time." We discussed importance of low sodium diet and weight loss.

## 2012-08-24 NOTE — Progress Notes (Signed)
Subjective:    Patient ID: Gerald [name], male    DOB: Oct 27, 1945, 67 y.o.   MRN: [id number]  HPI  Gerald [name] is a 67 yr old male who presents today for follow up.   1) HTN- he is maintained on bystolic, and benicar. Tolerating meds.    2) Hx of BPH/elevated PSA- he is followed by urology. Reports + urinary frequency which is at baseline.   Review of Systems See HPI  Past Medical History  Diagnosis Date  . Hypertension   . Hyperlipidemia   . 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 Alcohol abuse     quit 1989  . Hiatal hernia     EGD 3/10/3 stricture  . OSA (obstructive sleep apnea)     mild, per sleep study  . Onychomycosis   . Diverticulosis   . Hemorrhoids   . PVD (peripheral vascular disease)   . Fatty liver   . Hx of adenomatous colonic polyps   . GERD (gastroesophageal reflux disease)   . Esophageal stricture   . Insomnia   . Back pain, chronic     History   Social History  . Marital Status: Married    Spouse Name: N/A    Number of Children: 2  . Years of Education: N/A   Occupational History  . Retired    Social History Main Topics  . Smoking status: Former Smoker    Quit date: 08/29/2006  . Smokeless tobacco: Never Used  . Alcohol Use: No     Comment: quit alcohol 1989  . Drug Use: No  . Sexually Active: Not on file   Other Topics Concern  . Not on file   Social History Narrative   Diet: regular, lots of vegetables-----exercise : none but active   Caffeine use:  5 cups coffee daily          Past Surgical History  Procedure Laterality Date  . Tonsillectomy and adenoidectomy    . Cystectomy      Right leg remotely, back 2012  . Mass removal      left shoulder  . Wisdom tooth extraction      Family History  Problem Relation Age of Onset  . Coronary artery disease Father   . Colon cancer Father   . Cancer Father     colon  . Heart attack Paternal Grandfather   . Coronary artery disease Maternal  Grandmother   . Colon cancer Maternal Grandmother   . Cancer Maternal Grandmother     colon  . Hypertension Maternal Aunt   . Diabetes Neg Hx   . Prostate cancer Neg Hx   . Stroke Neg Hx   . Melanoma Mother   . Coronary artery disease Paternal Grandmother   . Heart attack Maternal Grandfather     Allergies  Allergen Reactions  . Penicillins     Current Outpatient Prescriptions on File Prior to Visit  Medication Sig Dispense Refill  . ACIPHEX 20 MG tablet TAKE 1 TABLET BY MOUTH EVERY DAY  30 tablet  0  . aspirin 81 MG tablet Take 81 mg by mouth every other day.      . ketoconazole (NIZORAL) 2 % shampoo Apply topically as needed. For eczema      . meclizine (ANTIVERT) 25 MG tablet Take 25 mg by mouth as needed.      . Nebivolol HCl (BYSTOLIC) 20 MG TABS 1 tablet by mouth once daily  30 tablet  5  . olmesartan (BENICAR) 40 MG tablet Take 1 tablet (40 mg total) by mouth daily.  30 tablet  5  . Omega-3 Fatty Acids (FISH OIL) 1200 MG CAPS Take 1 capsule by mouth daily.       No current facility-administered medications on file prior to visit.    BP 154/84  Pulse 51  Temp(Src) 98.3 F (36.8 C) (Oral)  Resp 16  Ht 5\' 6"  (1.676 m)  Wt 204 lb (92.534 kg)  BMI 32.94 kg/m2  SpO2 98%       Objective:   Physical Exam  Constitutional: He is oriented to person, place, and time. He appears well-developed and well-nourished. No distress.  Cardiovascular: Normal rate and regular rhythm.   No murmur heard. Pulmonary/Chest: Effort normal and breath sounds normal. No respiratory distress. He has no wheezes. He has no rales. He exhibits no tenderness.  Musculoskeletal: He exhibits no edema.  Neurological: He is alert and oriented to person, place, and time.  Psychiatric: He has a normal mood and affect. His behavior is normal. Judgment and thought content normal.          Assessment & Plan:

## 2012-08-24 NOTE — Patient Instructions (Addendum)
Please work on weight loss and continue low sodium diet. Follow up in 3 months.

## 2012-09-13 ENCOUNTER — Other Ambulatory Visit: Payer: Self-pay | Admitting: Gastroenterology

## 2012-09-15 ENCOUNTER — Telehealth: Payer: Self-pay | Admitting: Family

## 2012-09-15 NOTE — Telephone Encounter (Signed)
FYI

## 2012-09-15 NOTE — Telephone Encounter (Signed)
Patient Information:  Caller Name: Keval  Phone: [phone]  Patient: Gerald [name], Gerald [name]  Gender: Male  DOB: 12-Jan-1946  Age: 67 Years  PCP: Sandford Craze (Adults only)  Office Follow Up:  Does the office need to follow up with this patient?: No  Instructions For The Office: N/A  RN Note:  Advised to hydrate and humidify.  May use Guaifenesin (Mucinex, plain Robitrussin)as directed to loosen nasal congestion. Avoid decongestants due to HTN.  Symptoms  Reason For Call & Symptoms: Cold symptoms with sinus pressure causing teeth to hurt with malaise adn some body aches.; asking what medicine he can take with HTN and should not be taking Sinutab.  Reviewed Health History In EMR: Yes  Reviewed Medications In EMR: Yes  Reviewed Allergies In EMR: Yes  Reviewed Surgeries / Procedures: Yes  Date of Onset of Symptoms: 09/14/2012  Treatments Tried: Sinutab, Nyquil  Treatments Tried Worked: Yes  Guideline(s) Used:  Sinus Pain and Congestion  Disposition Per Guideline:   Home Care  Reason For Disposition Reached:   Sinus congestion as part of a cold, present < 10 days  Advice Given:  Reassurance:   Sinus congestion is a normal part of a cold.  Usually home treatment with nasal washes can prevent an actual bacterial sinus infection.  Antibiotics are not helpful for the sinus congestion that occurs with colds.  Here is some care advice that should help.  For a Runny Nose With Profuse Discharge:  Nasal mucus and discharge helps to wash viruses and bacteria out of the nose and sinuses.  Blowing the nose is all that is needed.  For a Stuffy Nose - Use Nasal Washes:  Introduction: Saline (salt water) nasal irrigation (nasal wash) is an effective and simple home remedy for treating stuffy nose and sinus congestion. The nose can be irrigated by pouring, spraying, or squirting salt water into the nose and then letting it run back out.  How it Helps: The salt water rinses out excess  mucus, washes out any irritants (dust, allergens) that might be present, and moistens the nasal cavity.  Methods: There are several ways to perform nasal irrigation. You can use a saline nasal spray bottle (available over-the-counter), a rubber ear syringe, a medical syringe without the needle, or a Neti Pot.  Pain and Fever Medicines:  For pain or fever relief, take either acetaminophen or ibuprofen.  Treat fevers above 101 F (38.3 C). The goal of fever therapy is to bring the fever down to a comfortable level. Remember that fever medicine usually lowers fever 2 degrees F (1 - 1 1/2 degrees C).  Hydration:  Drink plenty of liquids (6-8 glasses of water daily). If the air in your home is dry, use a cool mist humidifier  Expected Course:  Sinus congestion from viral upper respiratory infections (colds) usually lasts 5-10 days.  Occasionally a cold can worsen and turn into bacterial sinusitis. Clues to this are sinus symptoms lasting longer than 10 days, fever lasting longer than 3 days, and worsening pain. Bacterial sinusitis may need antibiotic treatment.  Call Back If:   Severe pain lasts longer than 2 hours after pain medicine  Sinus pain lasts longer than 1 day after starting treatment using nasal washes  Sinus congestion (fullness) lasts longer than 10 days  You become worse.  Patient Will Follow Care Advice:  YES

## 2012-11-15 DIAGNOSIS — R972 Elevated prostate specific antigen [PSA]: Secondary | ICD-10-CM | POA: Diagnosis not present

## 2012-11-19 ENCOUNTER — Telehealth: Payer: Self-pay | Admitting: *Deleted

## 2012-11-19 NOTE — Telephone Encounter (Signed)
Spoke with pt.  He reports diarrhea 1-2 times a week for 1 month. He has been on benicar since January. Admits to taking a lot of dietary supplements.  Advised pt to continue benicar for now. Discontinue supplements.  Let me know if symptoms do not improve.

## 2012-11-19 NOTE — Telephone Encounter (Signed)
Received message from pt stating he has been having diarrhea and read that this may be a side effect from benicar. Pt is wanting to try a generic alternative as he is in the donut hole until the end of the year.  Please advise.

## 2012-11-22 DIAGNOSIS — N400 Enlarged prostate without lower urinary tract symptoms: Secondary | ICD-10-CM | POA: Diagnosis not present

## 2012-11-22 DIAGNOSIS — R972 Elevated prostate specific antigen [PSA]: Secondary | ICD-10-CM | POA: Diagnosis not present

## 2012-11-23 ENCOUNTER — Ambulatory Visit: Payer: Medicare Other | Admitting: Family

## 2012-12-28 ENCOUNTER — Ambulatory Visit (INDEPENDENT_AMBULATORY_CARE_PROVIDER_SITE_OTHER): Payer: Medicare Other | Admitting: Family

## 2012-12-28 ENCOUNTER — Encounter: Payer: Self-pay | Admitting: Family

## 2012-12-28 VITALS — BP 130/80 | HR 50 | Temp 98.2°F | Resp 18 | Ht 66.0 in | Wt 204.1 lb

## 2012-12-28 DIAGNOSIS — I10 Essential (primary) hypertension: Secondary | ICD-10-CM | POA: Diagnosis not present

## 2012-12-28 NOTE — Progress Notes (Signed)
Subjective:    Patient ID: Gerald [name], male    DOB: May 26, 1945, 67 y.o.   MRN: [id number]  HPI Gerald [name] is a 67 year old male who presents today for follow-up of hypertension.     Review of Systems     Objective:   Physical Exam        Assessment & Plan:

## 2012-12-28 NOTE — Progress Notes (Signed)
Subjective:    Patient ID: Gerald [name], male    DOB: [birth year], 67 y.o.   MRN: [id number]  HPI  Gerald [name] is a 67 yr old male who presents today for follow up of his HTN.    Review of Systems     Objective:   Physical Exam  Constitutional: He is oriented to person, place, and time. He appears well-developed and well-nourished. No distress.  Cardiovascular: Normal rate and regular rhythm.   No murmur heard. Pulmonary/Chest: Effort normal and breath sounds normal. No respiratory distress. He has no wheezes. He has no rales. He exhibits no tenderness.  Musculoskeletal: He exhibits no edema.  Neurological: He is alert and oriented to person, place, and time.  Skin: Skin is warm and dry.  Psychiatric: He has a normal mood and affect. His behavior is normal. Judgment and thought content normal.          Assessment & Plan:

## 2012-12-28 NOTE — Patient Instructions (Addendum)
Please schedule a follow up appointment in 3 months.

## 2012-12-28 NOTE — Assessment & Plan Note (Signed)
BP stable on current meds. Continue same.  

## 2013-01-14 ENCOUNTER — Telehealth: Payer: Self-pay | Admitting: *Deleted

## 2013-01-14 MED ORDER — NEBIVOLOL HCL 10 MG PO TABS: 20.0000 mg | ORAL_TABLET | Freq: Every day | ORAL | Status: DC

## 2013-01-14 NOTE — Telephone Encounter (Signed)
Received message from pt that his bystolic 20mg  is on back order and they are not sure if the manufacturer will continue to make that strength?Sherron Monday with Irving Burton at [facility] that confirmed 20mg  is on back order but 10mg  is still available.  Do we send order for 10mg  and have pt take 2 a day?

## 2013-01-14 NOTE — Telephone Encounter (Signed)
New Rx sent. Left detailed message on home # and to call if any questions.

## 2013-01-14 NOTE — Telephone Encounter (Signed)
Yes please

## 2013-01-17 ENCOUNTER — Telehealth: Payer: Self-pay | Admitting: *Deleted

## 2013-01-17 NOTE — Telephone Encounter (Signed)
Received fax from pharmacy that bystolic 10mg  2 tablets daily are requiring prior authorization. Spoke with Harrold Donath at [phone]. Since bystolic 20mg  is on backorder, approval has been obtained through the end of this year, 03/30/13.  Notified pharmacy and pt.

## 2013-01-21 ENCOUNTER — Encounter: Payer: Self-pay | Admitting: Family

## 2013-01-21 ENCOUNTER — Telehealth: Payer: Self-pay | Admitting: *Deleted

## 2013-01-21 DIAGNOSIS — Z23 Encounter for immunization: Secondary | ICD-10-CM | POA: Diagnosis not present

## 2013-01-21 NOTE — Telephone Encounter (Signed)
Patient called and stated that he has a problem with his 01.06.14 OV, charge of $95.00 for 10-minute OV [#[id number]]; needs written statement with "what the purpose of the visit was for" for Insurance purposes; request to have this mailed to him/SLS

## 2013-01-21 NOTE — Telephone Encounter (Signed)
Letter has been completed

## 2013-01-21 NOTE — Telephone Encounter (Signed)
Spoke with pt. He verified that the insurance is requesting verification and purpose of his office visit in January. He states that his wife gets a certain amount of money each year on a card from her employer to be used for medical expenses and they are requesting this information. Pt requests letter be mailed to him.  Please advise.

## 2013-01-24 NOTE — Telephone Encounter (Signed)
Letter mailed

## 2013-01-26 ENCOUNTER — Telehealth: Payer: Self-pay | Admitting: Family

## 2013-01-26 MED ORDER — METOPROLOL SUCCINATE ER 50 MG PO TB24: 50.0000 mg | ORAL_TABLET | Freq: Every day | ORAL | Status: DC

## 2013-01-26 NOTE — Telephone Encounter (Signed)
Left message to return my call.  

## 2013-01-26 NOTE — Addendum Note (Signed)
Addended by: Sandford Craze on: 01/26/2013 04:43 PM   Modules accepted: Orders, Medications

## 2013-01-26 NOTE — Telephone Encounter (Signed)
Noted. rx cancelled for toprol.

## 2013-01-26 NOTE — Telephone Encounter (Signed)
Notified pt. He states that he is in the process of changing insurance plans and is unsure if it will cover the Bystolic but he wants to stay on it at present because he does not have any side effects with it. Advised pt to let us know once he changes plans if it will not be covered and we will proceed with alternative below.

## 2013-01-26 NOTE — Telephone Encounter (Signed)
Please call pt and let him know that I got notice that his insurance will no longer be covering bystolic.  I would like him to stop bystolic and instead start toprol xl 50. Check blood pressure once daily and contact us in 1 week with his readings.

## 2013-02-04 ENCOUNTER — Telehealth: Payer: Self-pay | Admitting: *Deleted

## 2013-02-04 NOTE — Telephone Encounter (Signed)
Received message from pt stating the insurance is requiring previous letter to include that the copay for his January office visit was $35.31 in addition to the information that was contained in the letter from 01/21/13 phone note.  Please advise if ok to re-do letter as requested.

## 2013-02-04 NOTE — Telephone Encounter (Signed)
OK 

## 2013-02-07 NOTE — Telephone Encounter (Signed)
Notified pt that letter has been updated and mailed.

## 2013-02-07 NOTE — Telephone Encounter (Signed)
Letter reprinted with update as requested below.

## 2013-02-08 ENCOUNTER — Other Ambulatory Visit: Payer: Self-pay | Admitting: Family

## 2013-02-17 ENCOUNTER — Telehealth: Payer: Self-pay | Admitting: *Deleted

## 2013-02-17 MED ORDER — NEBIVOLOL HCL 10 MG PO TABS: 20.0000 mg | ORAL_TABLET | Freq: Every day | ORAL | Status: DC

## 2013-02-17 NOTE — Telephone Encounter (Signed)
Received call from pt stating he needs a couple weeks worth of Bystolic until he can receive mail order supply.  Advised pt samples have been placed at the front desk.

## 2013-03-02 ENCOUNTER — Ambulatory Visit (INDEPENDENT_AMBULATORY_CARE_PROVIDER_SITE_OTHER): Payer: Medicare Other | Admitting: Family

## 2013-03-02 ENCOUNTER — Encounter: Payer: Self-pay | Admitting: Family

## 2013-03-02 VITALS — BP 146/80 | HR 59 | Temp 99.0°F | Resp 16 | Ht 66.0 in | Wt 206.0 lb

## 2013-03-02 DIAGNOSIS — J069 Acute upper respiratory infection, unspecified: Secondary | ICD-10-CM | POA: Diagnosis not present

## 2013-03-02 NOTE — Patient Instructions (Signed)
Please call if symptoms worsen or if symptoms do not improve. 

## 2013-03-02 NOTE — Progress Notes (Signed)
Subjective:    Patient ID: Gerald [name], male    DOB: 17-Dec-1945, 67 y.o.   MRN: [id number]  HPI  Gerald [name] is a 67 yr old male who presents today with 2 week hx of nasal congestion. Reports associated cough which has improved the last 2 days. Denies known fever.  Reports that nasal congestion is important.  Just kept important to "makes sure I don't have pneumonia."  Reports nasal drainage is clear in color.   Review of Systems See HPI  Past Medical History  Diagnosis Date  . Hypertension   . Hyperlipidemia   . 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 Alcohol abuse     quit 1989  . Hiatal hernia     EGD 3/10/3 stricture  . OSA (obstructive sleep apnea)     mild, per sleep study  . Onychomycosis   . Diverticulosis   . Hemorrhoids   . PVD (peripheral vascular disease)   . Fatty liver   . Hx of adenomatous colonic polyps   . GERD (gastroesophageal reflux disease)   . Esophageal stricture   . Insomnia   . Back pain, chronic     History   Social History  . Marital Status: Married    Spouse Name: N/A    Number of Children: 2  . Years of Education: N/A   Occupational History  . Retired    Social History Main Topics  . Smoking status: Former Smoker    Quit date: 08/29/2006  . Smokeless tobacco: Never Used  . Alcohol Use: No     Comment: quit alcohol 1989  . Drug Use: No  . Sexual Activity: Not on file   Other Topics Concern  . Not on file   Social History Narrative   Diet: regular, lots of vegetables-----exercise : none but active   Caffeine use:  5 cups coffee daily          Past Surgical History  Procedure Laterality Date  . Tonsillectomy and adenoidectomy    . Cystectomy      Right leg remotely, back 2012  . Mass removal      left shoulder  . Wisdom tooth extraction      Family History  Problem Relation Age of Onset  . Coronary artery disease Father   . Colon cancer Father   . Cancer Father     colon  .  Heart attack Paternal Grandfather   . Coronary artery disease Maternal Grandmother   . Colon cancer Maternal Grandmother   . Cancer Maternal Grandmother     colon  . Hypertension Maternal Aunt   . Diabetes Neg Hx   . Prostate cancer Neg Hx   . Stroke Neg Hx   . Melanoma Mother   . Coronary artery disease Paternal Grandmother   . Heart attack Maternal Grandfather     Allergies  Allergen Reactions  . Penicillins     Current Outpatient Prescriptions on File Prior to Visit  Medication Sig Dispense Refill  . aspirin 81 MG tablet Take 81 mg by mouth every other day.      Marland Kitchen BENICAR 40 MG tablet TAKE 1 TABLET BY MOUTH DAILY  30 tablet  0  . ketoconazole (NIZORAL) 2 % shampoo Apply topically as needed. For eczema      . meclizine (ANTIVERT) 25 MG tablet Take 25 mg by mouth as needed.      . nebivolol (BYSTOLIC) 10 MG  tablet Take 2 tablets (20 mg total) by mouth daily.  60 tablet  3  . Omega-3 Fatty Acids (FISH OIL) 1200 MG CAPS Take 1 capsule by mouth daily.       No current facility-administered medications on file prior to visit.    BP 146/80  Pulse 59  Temp(Src) 99 F (37.2 C) (Oral)  Resp 16  Ht 5\' 6"  (1.676 m)  Wt 206 lb 0.6 oz (93.459 kg)  BMI 33.27 kg/m2  SpO2 95%       Objective:   Physical Exam  Constitutional: He is oriented to person, place, and time. He appears well-developed and well-nourished. No distress.  HENT:  Head: Normocephalic and atraumatic.  Right Ear: Tympanic membrane and ear canal normal.  Left Ear: Tympanic membrane and ear canal normal.  Cardiovascular: Normal rate and regular rhythm.   No murmur heard. Pulmonary/Chest: Effort normal and breath sounds normal. No respiratory distress. He has no wheezes. He has no rales. He exhibits no tenderness.  Musculoskeletal: He exhibits no edema.  Neurological: He is alert and oriented to person, place, and time.  Psychiatric: He has a normal mood and affect. His behavior is normal. Judgment and thought  content normal.          Assessment & Plan:

## 2013-03-02 NOTE — Assessment & Plan Note (Signed)
Resolving.  Pt instructed to call if symptoms worsen or if symptoms do not improve.

## 2013-03-10 ENCOUNTER — Telehealth: Payer: Self-pay | Admitting: Family

## 2013-03-10 NOTE — Telephone Encounter (Signed)
Requesting refill on benicar 40 mg  Has upcoming on appt on 12/23 @1 , does he need to keep this appt since he was seen 03/02/13

## 2013-03-11 MED ORDER — OLMESARTAN MEDOXOMIL 40 MG PO TABS: ORAL_TABLET | ORAL | Status: DC

## 2013-03-11 NOTE — Telephone Encounter (Signed)
Notified pt Rx has been sent and advised him to keep f/u on 03/22/13 as it is for his BP and labs may be needed. Pt states he wasn't requesting refill but was asking for sampled until the first of the year when his new insurance will take effect. Advised him samples will be left the front desk.

## 2013-03-21 ENCOUNTER — Ambulatory Visit (INDEPENDENT_AMBULATORY_CARE_PROVIDER_SITE_OTHER): Payer: Medicare Other | Admitting: Family

## 2013-03-21 ENCOUNTER — Encounter: Payer: Self-pay | Admitting: Family

## 2013-03-21 VITALS — BP 150/80 | HR 56 | Temp 98.3°F | Resp 16 | Ht 66.0 in | Wt 208.0 lb

## 2013-03-21 DIAGNOSIS — I10 Essential (primary) hypertension: Secondary | ICD-10-CM

## 2013-03-21 LAB — BASIC METABOLIC PANEL
BUN: 16 mg/dL (ref 6–23)
CO2: 30 mEq/L (ref 19–32)
Calcium: 9.1 mg/dL (ref 8.4–10.5)
Creat: 1.08 mg/dL (ref 0.50–1.35)
Glucose, Bld: 83 mg/dL (ref 70–99)

## 2013-03-21 NOTE — Progress Notes (Signed)
Pre visit review using our clinic review tool, if applicable. No additional management support is needed unless otherwise documented below in the visit note. 

## 2013-03-21 NOTE — Progress Notes (Signed)
Subjective:    Patient ID: Gerald [name], male    DOB: 1945-07-05, 67 y.o.   MRN: [id number]  HPI  Gerald [name] is a 67 yr old male who presents today for follow up of his hypertension. Reports BP running 140's over 79 at home.  He denies CP.  Mild SOB which he attributes to sedentary lifestyle.    BP Readings from Last 3 Encounters:  03/21/13 150/80  03/02/13 146/80  12/28/12 130/80   124/66  Review of Systems See HPI  Past Medical History  Diagnosis Date  . Hypertension   . Hyperlipidemia   . 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 Alcohol abuse     quit 1989  . Hiatal hernia     EGD 3/10/3 stricture  . OSA (obstructive sleep apnea)     mild, per sleep study  . Onychomycosis   . Diverticulosis   . Hemorrhoids   . PVD (peripheral vascular disease)   . Fatty liver   . Hx of adenomatous colonic polyps   . GERD (gastroesophageal reflux disease)   . Esophageal stricture   . Insomnia   . Back pain, chronic     History   Social History  . Marital Status: Married    Spouse Name: N/A    Number of Children: 2  . Years of Education: N/A   Occupational History  . Retired    Social History Main Topics  . Smoking status: Former Smoker    Quit date: 08/29/2006  . Smokeless tobacco: Never Used  . Alcohol Use: No     Comment: quit alcohol 1989  . Drug Use: No  . Sexual Activity: Not on file   Other Topics Concern  . Not on file   Social History Narrative   Diet: regular, lots of vegetables-----exercise : none but active   Caffeine use:  5 cups coffee daily          Past Surgical History  Procedure Laterality Date  . Tonsillectomy and adenoidectomy    . Cystectomy      Right leg remotely, back 2012  . Mass removal      left shoulder  . Wisdom tooth extraction      Family History  Problem Relation Age of Onset  . Coronary artery disease Father   . Colon cancer Father   . Cancer Father     colon  . Heart attack  Paternal Grandfather   . Coronary artery disease Maternal Grandmother   . Colon cancer Maternal Grandmother   . Cancer Maternal Grandmother     colon  . Hypertension Maternal Aunt   . Diabetes Neg Hx   . Prostate cancer Neg Hx   . Stroke Neg Hx   . Melanoma Mother   . Coronary artery disease Paternal Grandmother   . Heart attack Maternal Grandfather     Allergies  Allergen Reactions  . Penicillins     Current Outpatient Prescriptions on File Prior to Visit  Medication Sig Dispense Refill  . aspirin 81 MG tablet Take 81 mg by mouth every other day.      . ketoconazole (NIZORAL) 2 % shampoo Apply topically as needed. For eczema      . meclizine (ANTIVERT) 25 MG tablet Take 25 mg by mouth as needed.      . nebivolol (BYSTOLIC) 10 MG tablet Take 2 tablets (20 mg total) by mouth daily.  60 tablet  3  .  olmesartan (BENICAR) 40 MG tablet TAKE 1 TABLET BY MOUTH DAILY  30 tablet  3  . olmesartan (BENICAR) 40 MG tablet TAKE 1 TABLET BY MOUTH DAILY  21 tablet  0  . Omega-3 Fatty Acids (FISH OIL) 1200 MG CAPS Take 1 capsule by mouth daily.       No current facility-administered medications on file prior to visit.    BP 150/80  Pulse 56  Temp(Src) 98.3 F (36.8 C) (Oral)  Resp 16  Ht 5\' 6"  (1.676 m)  Wt 208 lb (94.348 kg)  BMI 33.59 kg/m2  SpO2 98%       Objective:   Physical Exam  Constitutional: He is oriented to person, place, and time. He appears well-developed and well-nourished. No distress.  Cardiovascular: Normal rate and regular rhythm.   No murmur heard. Pulmonary/Chest: Effort normal and breath sounds normal. No respiratory distress. He has no wheezes. He has no rales. He exhibits no tenderness.  Neurological: He is alert and oriented to person, place, and time.  Psychiatric: He has a normal mood and affect. His behavior is normal. Judgment and thought content normal.          Assessment & Plan:

## 2013-03-21 NOTE — Assessment & Plan Note (Signed)
Blood pressure stable today. Continue current meds. Obtain bmet. Follow up in 3 months.

## 2013-03-21 NOTE — Patient Instructions (Signed)
Please complete your lab work prior to leaving. Follow up in 3 months.   

## 2013-03-22 ENCOUNTER — Encounter: Payer: Self-pay | Admitting: Family

## 2013-04-15 DIAGNOSIS — L219 Seborrheic dermatitis, unspecified: Secondary | ICD-10-CM | POA: Diagnosis not present

## 2013-04-15 DIAGNOSIS — L919 Hypertrophic disorder of the skin, unspecified: Secondary | ICD-10-CM | POA: Diagnosis not present

## 2013-04-15 DIAGNOSIS — L821 Other seborrheic keratosis: Secondary | ICD-10-CM | POA: Diagnosis not present

## 2013-04-15 DIAGNOSIS — L719 Rosacea, unspecified: Secondary | ICD-10-CM | POA: Diagnosis not present

## 2013-04-15 DIAGNOSIS — L905 Scar conditions and fibrosis of skin: Secondary | ICD-10-CM | POA: Diagnosis not present

## 2013-04-15 DIAGNOSIS — L819 Disorder of pigmentation, unspecified: Secondary | ICD-10-CM | POA: Diagnosis not present

## 2013-04-15 DIAGNOSIS — D2360 Other benign neoplasm of skin of unspecified upper limb, including shoulder: Secondary | ICD-10-CM | POA: Diagnosis not present

## 2013-04-15 DIAGNOSIS — L909 Atrophic disorder of skin, unspecified: Secondary | ICD-10-CM | POA: Diagnosis not present

## 2013-05-23 DIAGNOSIS — N400 Enlarged prostate without lower urinary tract symptoms: Secondary | ICD-10-CM | POA: Diagnosis not present

## 2013-05-23 DIAGNOSIS — R972 Elevated prostate specific antigen [PSA]: Secondary | ICD-10-CM | POA: Diagnosis not present

## 2013-05-30 DIAGNOSIS — N400 Enlarged prostate without lower urinary tract symptoms: Secondary | ICD-10-CM | POA: Diagnosis not present

## 2013-05-30 DIAGNOSIS — R972 Elevated prostate specific antigen [PSA]: Secondary | ICD-10-CM | POA: Diagnosis not present

## 2013-07-18 ENCOUNTER — Encounter: Payer: Self-pay | Admitting: Family

## 2013-07-18 ENCOUNTER — Ambulatory Visit (INDEPENDENT_AMBULATORY_CARE_PROVIDER_SITE_OTHER): Payer: Medicare Other | Admitting: Family

## 2013-07-18 VITALS — BP 146/84 | HR 51 | Temp 98.3°F | Resp 18 | Ht 66.0 in | Wt 207.0 lb

## 2013-07-18 DIAGNOSIS — I10 Essential (primary) hypertension: Secondary | ICD-10-CM

## 2013-07-18 DIAGNOSIS — R972 Elevated prostate specific antigen [PSA]: Secondary | ICD-10-CM

## 2013-07-18 MED ORDER — NEBIVOLOL HCL 20 MG PO TABS: ORAL_TABLET | ORAL | Status: DC

## 2013-07-18 NOTE — Progress Notes (Signed)
Subjective:    Patient ID: Gerald [name], male    DOB: Dec 10, 1945, 68 y.o.   MRN: [id number]  HPI  Gerald [name] is a 68 yr old male who presents today for follow up.  HTN-  He was last seen back in early December 2014. He is maintained on bystolic 10mg  once daily. Denies CP/SOB or swelling.  BP Readings from Last 3 Encounters:  07/18/13 146/84  03/21/13 150/80  03/02/13 146/80   Elevated PSA- follows with Dr. Brunilda Payor-  Reports + urinary frequency (urinates every 2 hours).   Review of Systems See HPI  Past Medical History  Diagnosis Date  . Hypertension   . Hyperlipidemia   . 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 Alcohol abuse     quit 1989  . Hiatal hernia     EGD 3/10/3 stricture  . OSA (obstructive sleep apnea)     mild, per sleep study  . Onychomycosis   . Diverticulosis   . Hemorrhoids   . PVD (peripheral vascular disease)   . Fatty liver   . Hx of adenomatous colonic polyps   . GERD (gastroesophageal reflux disease)   . Esophageal stricture   . Insomnia   . Back pain, chronic     History   Social History  . Marital Status: Married    Spouse Name: N/A    Number of Children: 2  . Years of Education: N/A   Occupational History  . Retired    Social History Main Topics  . Smoking status: Former Smoker    Quit date: 08/29/2006  . Smokeless tobacco: Never Used  . Alcohol Use: No     Comment: quit alcohol 1989  . Drug Use: No  . Sexual Activity: Not on file   Other Topics Concern  . Not on file   Social History Narrative   Diet: regular, lots of vegetables-----exercise : none but active   Caffeine use:  5 cups coffee daily          Past Surgical History  Procedure Laterality Date  . Tonsillectomy and adenoidectomy    . Cystectomy      Right leg remotely, back 2012  . Mass removal      left shoulder  . Wisdom tooth extraction      Family History  Problem Relation Age of Onset  . Coronary artery disease  Father   . Colon cancer Father   . Cancer Father     colon  . Heart attack Paternal Grandfather   . Coronary artery disease Maternal Grandmother   . Colon cancer Maternal Grandmother   . Cancer Maternal Grandmother     colon  . Hypertension Maternal Aunt   . Diabetes Neg Hx   . Prostate cancer Neg Hx   . Stroke Neg Hx   . Melanoma Mother   . Coronary artery disease Paternal Grandmother   . Heart attack Maternal Grandfather     Allergies  Allergen Reactions  . Penicillins     Current Outpatient Prescriptions on File Prior to Visit  Medication Sig Dispense Refill  . aspirin 81 MG tablet Take 81 mg by mouth every other day.      . ketoconazole (NIZORAL) 2 % shampoo Apply topically as needed. For eczema      . meclizine (ANTIVERT) 25 MG tablet Take 25 mg by mouth as needed.      . nebivolol (BYSTOLIC) 10 MG tablet Take 2  tablets (20 mg total) by mouth daily.  60 tablet  3  . olmesartan (BENICAR) 40 MG tablet TAKE 1 TABLET BY MOUTH DAILY  30 tablet  3  . Omega-3 Fatty Acids (FISH OIL) 1200 MG CAPS Take 1 capsule by mouth daily.       No current facility-administered medications on file prior to visit.    BP 146/84  Pulse 51  Temp(Src) 98.3 F (36.8 C) (Oral)  Resp 18  Ht 5\' 6"  (1.676 m)  Wt 207 lb (93.895 kg)  BMI 33.43 kg/m2  SpO2 99%       Objective:   Physical Exam  Constitutional: He appears well-developed and well-nourished. No distress.  HENT:  Head: Normocephalic and atraumatic.  Cardiovascular: Normal rate and regular rhythm.   No murmur heard. Pulmonary/Chest: Effort normal and breath sounds normal. No respiratory distress. He has no wheezes. He has no rales. He exhibits no tenderness.  Psychiatric: He has a normal mood and affect. His behavior is normal. Judgment and thought content normal.          Assessment & Plan:

## 2013-07-18 NOTE — Assessment & Plan Note (Signed)
BP remains above goal.  Increase bystolic from 10mg  to 20mg . Follow up in 1 month. Plan BMET at that time. Continue benicar.

## 2013-07-18 NOTE — Assessment & Plan Note (Signed)
Dr. Janice Norrie is monitoring his PSA.   I suspect his urinary frequency which is long standing is due to enlarged prostate.  He has tried flomax in the past but stopped due to side effect of ED.  I advised him to discuss with Dr. Janice Norrie.

## 2013-07-18 NOTE — Progress Notes (Signed)
Pre visit review using our clinic review tool, if applicable. No additional management support is needed unless otherwise documented below in the visit note. 

## 2013-07-18 NOTE — Patient Instructions (Signed)
Continue Benicar. Increase bystolic from 10mg  to 20 mg. Follow up in 1 month so we can recheck your blood pressure.

## 2013-07-19 ENCOUNTER — Telehealth: Payer: Self-pay | Admitting: Family

## 2013-07-19 ENCOUNTER — Other Ambulatory Visit: Payer: Self-pay | Admitting: Family

## 2013-07-19 NOTE — Telephone Encounter (Signed)
Relevant patient education assigned to patient using Emmi. ° °

## 2013-08-01 ENCOUNTER — Other Ambulatory Visit: Payer: Self-pay | Admitting: Family

## 2013-08-02 ENCOUNTER — Telehealth: Payer: Self-pay | Admitting: *Deleted

## 2013-08-02 NOTE — Telephone Encounter (Signed)
Received call from pt that he thought we were increasing his bystolic dose after last visit but when he picked up Rx it is the same dose he has been on. Pt states he was previously taking 10mg , 2 tablets daily. When he picked up refill it is for the same directions. Recent office note said to increase dose to 20mg  daily but pt was already taking that dose. He states that he doesn't want to make any further adjustments at this time and he will continue 20mg  daily and discuss further at next office visit on 09/02/13.

## 2013-08-04 ENCOUNTER — Ambulatory Visit (INDEPENDENT_AMBULATORY_CARE_PROVIDER_SITE_OTHER): Payer: Medicare Other | Admitting: Physician Assistant

## 2013-08-04 ENCOUNTER — Encounter: Payer: Self-pay | Admitting: Physician Assistant

## 2013-08-04 VITALS — BP 120/58 | HR 52 | Temp 98.3°F | Wt 204.0 lb

## 2013-08-04 DIAGNOSIS — A088 Other specified intestinal infections: Secondary | ICD-10-CM

## 2013-08-04 DIAGNOSIS — A084 Viral intestinal infection, unspecified: Secondary | ICD-10-CM | POA: Insufficient documentation

## 2013-08-04 NOTE — Progress Notes (Signed)
Patient presents to clinic today c/o 2.5 days of diarrhea. Patient endorses frequent loose stools. Patient states stools have become less and less frequent, until stopping this morning at 8 AM. Patient denies nausea, vomiting, fever, chills.  Denies recent travel or sick contact. Denies abnormal food or water sources. Patient has not taken anything for symptoms. Is tolerating oral fluids and solid foods.    Past Medical History  Diagnosis Date  . Hypertension   . Hyperlipidemia   . 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 Alcohol abuse     quit 1989  . Hiatal hernia     EGD 3/10/3 stricture  . OSA (obstructive sleep apnea)     mild, per sleep study  . Onychomycosis   . Diverticulosis   . Hemorrhoids   . PVD (peripheral vascular disease)   . Fatty liver   . Hx of adenomatous colonic polyps   . GERD (gastroesophageal reflux disease)   . Esophageal stricture   . Insomnia   . Back pain, chronic     Current Outpatient Prescriptions on File Prior to Visit  Medication Sig Dispense Refill  . aspirin 81 MG tablet Take 81 mg by mouth every other day.      Marland Kitchen BENICAR 40 MG tablet TAKE 1 TABLET BY MOUTH DAILY  30 tablet  0  . ketoconazole (NIZORAL) 2 % shampoo Apply topically as needed. For eczema      . meclizine (ANTIVERT) 25 MG tablet Take 25 mg by mouth as needed.      . Nebivolol HCl (BYSTOLIC) 20 MG TABS One tab by mouth once daily  30 tablet  2  . Omega-3 Fatty Acids (FISH OIL) 1200 MG CAPS Take 1 capsule by mouth daily.       No current facility-administered medications on file prior to visit.    Allergies  Allergen Reactions  . Penicillins     Family History  Problem Relation Age of Onset  . Coronary artery disease Father   . Colon cancer Father   . Cancer Father     colon  . Heart attack Paternal Grandfather   . Coronary artery disease Maternal Grandmother   . Colon cancer Maternal Grandmother   . Cancer Maternal Grandmother     colon  .  Hypertension Maternal Aunt   . Diabetes Neg Hx   . Prostate cancer Neg Hx   . Stroke Neg Hx   . Melanoma Mother   . Coronary artery disease Paternal Grandmother   . Heart attack Maternal Grandfather     History   Social History  . Marital Status: Married    Spouse Name: N/A    Number of Children: 2  . Years of Education: N/A   Occupational History  . Retired    Social History Main Topics  . Smoking status: Former Smoker    Quit date: 08/29/2006  . Smokeless tobacco: Never Used  . Alcohol Use: No     Comment: quit alcohol 1989  . Drug Use: No  . Sexual Activity: None   Other Topics Concern  . None   Social History Narrative   Diet: regular, lots of vegetables-----exercise : none but active   Caffeine use:  5 cups coffee daily         Review of Systems - See HPI.  All other ROS are negative.  BP 120/58  Pulse 52  Temp(Src) 98.3 F (36.8 C) (Oral)  Wt 204 lb (  92.534 kg)  SpO2 97%  Physical Exam  Vitals reviewed. Constitutional: He is oriented to person, place, and time and well-developed, well-nourished, and in no distress.  HENT:  Head: Normocephalic and atraumatic.  Right Ear: External ear normal.  Left Ear: External ear normal.  Nose: Nose normal.  Mouth/Throat: Oropharynx is clear and moist. No oropharyngeal exudate.  Eyes: Conjunctivae are normal. Pupils are equal, round, and reactive to light.  Neck: Neck supple.  Cardiovascular: Normal rate, regular rhythm, normal heart sounds and intact distal pulses.   Pulmonary/Chest: Effort normal and breath sounds normal. No respiratory distress. He has no wheezes. He has no rales. He exhibits no tenderness.  Neurological: He is alert and oriented to person, place, and time.  Skin: Skin is warm and dry. No rash noted.  Psychiatric: Affect normal.   Assessment/Plan: Viral gastroenteritis Symptoms have mostly resolved on their own at this point. Continue good hydration. Diet for diarrhea given. Reintroduce  solid foods as tolerated. May take Pepto-Bismol as needed for stomach upset. Call or return to clinic if symptoms recur.

## 2013-08-04 NOTE — Progress Notes (Signed)
Pre visit review using our clinic review tool, if applicable. No additional management support is needed unless otherwise documented below in the visit note. 

## 2013-08-04 NOTE — Assessment & Plan Note (Signed)
Symptoms have mostly resolved on their own at this point. Continue good hydration. Diet for diarrhea given. Reintroduce solid foods as tolerated. May take Pepto-Bismol as needed for stomach upset. Call or return to clinic if symptoms recur.

## 2013-08-04 NOTE — Patient Instructions (Signed)
Your symptoms seem viral in nature. Increase fluid intake. Read the diet for diarrhea listed below. YOu may take some Pepto-Bismol if needed for stomach upset.  Reintroduce foods as tolerated.  Call or return to clinic if symptoms recur.  Diet for Diarrhea, Adult Frequent, runny stools (diarrhea) may be caused or worsened by food or drink. Diarrhea may be relieved by changing your diet. Since diarrhea can last up to 7 days, it is easy for you to lose too much fluid from the body and become dehydrated. Fluids that are lost need to be replaced. Along with a modified diet, make sure you drink enough fluids to keep your urine clear or pale yellow. DIET INSTRUCTIONS  Ensure adequate fluid intake (hydration): have 1 cup (8 oz) of fluid for each diarrhea episode. Avoid fluids that contain simple sugars or sports drinks, fruit juices, whole milk products, and sodas. Your urine should be clear or pale yellow if you are drinking enough fluids. Hydrate with an oral rehydration solution that you can purchase at pharmacies, retail stores, and online. You can prepare an oral rehydration solution at home by mixing the following ingredients together:    tsp table salt.   tsp baking soda.   tsp salt substitute containing potassium chloride.  1  tablespoons sugar.  1 L (34 oz) of water.  Certain foods and beverages may increase the speed at which food moves through the gastrointestinal (GI) tract. These foods and beverages should be avoided and include:  Caffeinated and alcoholic beverages.  High-fiber foods, such as raw fruits and vegetables, nuts, seeds, and whole grain breads and cereals.  Foods and beverages sweetened with sugar alcohols, such as xylitol, sorbitol, and mannitol.  Some foods may be well tolerated and may help thicken stool including:  Starchy foods, such as rice, toast, pasta, low-sugar cereal, oatmeal, grits, baked potatoes, crackers, and bagels.   Bananas.   Applesauce.  Add  probiotic-rich foods to help increase healthy bacteria in the GI tract, such as yogurt and fermented milk products. RECOMMENDED FOODS AND BEVERAGES Starches Choose foods with less than 2 g of fiber per serving.  Recommended:  White, [place], and pita breads, plain rolls, buns, bagels. Plain muffins, matzo. Soda, saltine, or graham crackers. Pretzels, melba toast, zwieback. Cooked cereals made with water: cornmeal, farina, cream cereals. Dry cereals: refined corn, wheat, rice. Potatoes prepared any way without skins, refined macaroni, spaghetti, noodles, refined rice.  Avoid:  Bread, rolls, or crackers made with whole wheat, multi-grains, rye, bran seeds, nuts, or coconut. Corn tortillas or taco shells. Cereals containing whole grains, multi-grains, bran, coconut, nuts, raisins. Cooked or dry oatmeal. Coarse wheat cereals, granola. Cereals advertised as "high-fiber." Potato skins. Whole grain pasta, wild or brown rice. Popcorn. Sweet potatoes, yams. Sweet rolls, doughnuts, waffles, pancakes, sweet breads. Vegetables  Recommended: Strained tomato and vegetable juices. Most well-cooked and canned vegetables without seeds. Fresh: Tender lettuce, cucumber without the skin, cabbage, spinach, bean sprouts.  Avoid: Fresh, cooked, or canned: Artichokes, baked beans, beet greens, broccoli, Brussels sprouts, corn, kale, legumes, peas, sweet potatoes. Cooked: Green or red cabbage, spinach. Avoid large servings of any vegetables because vegetables shrink when cooked, and they contain more fiber per serving than fresh vegetables. Fruit  Recommended: Cooked or canned: Apricots, applesauce, cantaloupe, cherries, fruit cocktail, grapefruit, grapes, kiwi, mandarin oranges, peaches, pears, plums, watermelon. Fresh: Apples without skin, ripe banana, grapes, cantaloupe, cherries, grapefruit, peaches, oranges, plums. Keep servings limited to  cup or 1 piece.  Avoid: Fresh: Apples with  skin, apricots, mangoes, pears,  raspberries, strawberries. Prune juice, stewed or dried prunes. Dried fruits, raisins, dates. Large servings of all fresh fruits. Protein  Recommended: Ground or well-cooked tender beef, ham, veal, lamb, pork, or poultry. Eggs. Fish, oysters, shrimp, lobster, other seafoods. Liver, organ meats.  Avoid: Tough, fibrous meats with gristle. Peanut butter, smooth or chunky. Cheese, nuts, seeds, legumes, dried peas, beans, lentils. Dairy  Recommended: Yogurt, lactose-free milk, kefir, drinkable yogurt, buttermilk, soy milk, or plain hard cheese.  Avoid: Milk, chocolate milk, beverages made with milk, such as milkshakes. Soups  Recommended: Bouillon, broth, or soups made from allowed foods. Any strained soup.  Avoid: Soups made from vegetables that are not allowed, cream or milk-based soups. Desserts and Sweets  Recommended: Sugar-free gelatin, sugar-free frozen ice pops made without sugar alcohol.  Avoid: Plain cakes and cookies, pie made with fruit, pudding, custard, cream pie. Gelatin, fruit, ice, sherbet, frozen ice pops. Ice cream, ice milk without nuts. Plain hard candy, honey, jelly, molasses, syrup, sugar, chocolate syrup, gumdrops, marshmallows. Fats and Oils  Recommended: Limit fats to less than 8 tsp per day.  Avoid: Seeds, nuts, olives, avocados. Margarine, butter, cream, mayonnaise, salad oils, plain salad dressings. Plain gravy, crisp bacon without rind. Beverages  Recommended: Water, decaffeinated teas, oral rehydration solutions, sugar-free beverages not sweetened with sugar alcohols.  Avoid: Fruit juices, caffeinated beverages (coffee, tea, soda), alcohol, sports drinks, or lemon-lime soda. Condiments  Recommended: Ketchup, mustard, horseradish, vinegar, cocoa powder. Spices in moderation: allspice, basil, bay leaves, celery powder or leaves, cinnamon, cumin powder, curry powder, ginger, mace, marjoram, onion or garlic powder, oregano, paprika, parsley flakes, ground  pepper, rosemary, sage, savory, tarragon, thyme, turmeric.  Avoid: Coconut, honey. Document Released: 06/07/2003 Document Revised: 12/10/2011 Document Reviewed: 08/01/2011 [facility] Patient Information 2014 [place].

## 2013-09-02 ENCOUNTER — Other Ambulatory Visit: Payer: Self-pay | Admitting: Family

## 2013-09-02 ENCOUNTER — Ambulatory Visit: Payer: Medicare Other | Admitting: Family

## 2013-09-09 ENCOUNTER — Ambulatory Visit (INDEPENDENT_AMBULATORY_CARE_PROVIDER_SITE_OTHER): Payer: Medicare Other | Admitting: Family

## 2013-09-09 ENCOUNTER — Encounter: Payer: Self-pay | Admitting: Family

## 2013-09-09 VITALS — BP 122/88 | HR 51 | Temp 98.1°F | Resp 16 | Ht 66.0 in | Wt 207.0 lb

## 2013-09-09 DIAGNOSIS — K3189 Other diseases of stomach and duodenum: Secondary | ICD-10-CM

## 2013-09-09 DIAGNOSIS — R1013 Epigastric pain: Secondary | ICD-10-CM

## 2013-09-09 DIAGNOSIS — R197 Diarrhea, unspecified: Secondary | ICD-10-CM | POA: Diagnosis not present

## 2013-09-09 DIAGNOSIS — I10 Essential (primary) hypertension: Secondary | ICD-10-CM | POA: Diagnosis not present

## 2013-09-09 DIAGNOSIS — K30 Functional dyspepsia: Secondary | ICD-10-CM

## 2013-09-09 MED ORDER — NEBIVOLOL HCL 20 MG PO TABS: ORAL_TABLET | ORAL | Status: DC

## 2013-09-09 MED ORDER — OLMESARTAN MEDOXOMIL 40 MG PO TABS: ORAL_TABLET | ORAL | Status: DC

## 2013-09-09 NOTE — Assessment & Plan Note (Signed)
Overall, symptoms sound like IBS.  He would like referral to GI.  Will arrange.

## 2013-09-09 NOTE — Assessment & Plan Note (Signed)
BP stable on current meds.  Obtain bmet. 

## 2013-09-09 NOTE — Progress Notes (Signed)
Subjective:    Patient ID: Gerald [name], male    DOB: 1945/12/15, 68 y.o.   MRN: [id number]  HPI  Gerald [name] is a 68 yr old male who presents today for follow up.  1) HTN- He is currently maintained on bystolic 20mg  once daily along with benicar 40mg  once daily. Denies CP/SOB or swelling. BP Readings from Last 3 Encounters:  09/09/13 122/88  08/04/13 120/58  07/18/13 146/84   2) "indigestion"- reports intermittent indigestion. He denies significant Reflux symptoms.   Reports some abdominal bloating. Notes that stool frequency and consistency vary.  Sometimes gatorade or a bowel of cereal constipation/diarrhea. Last colo was 8 yrs ago- was told that he needs 10 yr follow up.   Review of Systems    see HPI  Past Medical History  Diagnosis Date  . Hypertension   . Hyperlipidemia   . 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 Alcohol abuse     quit 1989  . Hiatal hernia     EGD 3/10/3 stricture  . OSA (obstructive sleep apnea)     mild, per sleep study  . Onychomycosis   . Diverticulosis   . Hemorrhoids   . PVD (peripheral vascular disease)   . Fatty liver   . Hx of adenomatous colonic polyps   . GERD (gastroesophageal reflux disease)   . Esophageal stricture   . Insomnia   . Back pain, chronic     History   Social History  . Marital Status: Married    Spouse Name: N/A    Number of Children: 2  . Years of Education: N/A   Occupational History  . Retired    Social History Main Topics  . Smoking status: Former Smoker    Quit date: 08/29/2006  . Smokeless tobacco: Never Used  . Alcohol Use: No     Comment: quit alcohol 1989  . Drug Use: No  . Sexual Activity: Not on file   Other Topics Concern  . Not on file   Social History Narrative   Diet: regular, lots of vegetables-----exercise : none but active   Caffeine use:  5 cups coffee daily          Past Surgical History  Procedure Laterality Date  . Tonsillectomy and  adenoidectomy    . Cystectomy      Right leg remotely, back 2012  . Mass removal      left shoulder  . Wisdom tooth extraction      Family History  Problem Relation Age of Onset  . Coronary artery disease Father   . Colon cancer Father   . Cancer Father     colon  . Heart attack Paternal Grandfather   . Coronary artery disease Maternal Grandmother   . Colon cancer Maternal Grandmother   . Cancer Maternal Grandmother     colon  . Hypertension Maternal Aunt   . Diabetes Neg Hx   . Prostate cancer Neg Hx   . Stroke Neg Hx   . Melanoma Mother   . Coronary artery disease Paternal Grandmother   . Heart attack Maternal Grandfather     Allergies  Allergen Reactions  . Penicillins     Current Outpatient Prescriptions on File Prior to Visit  Medication Sig Dispense Refill  . aspirin 81 MG tablet Take 81 mg by mouth every other day.      . ketoconazole (NIZORAL) 2 % shampoo Apply topically as needed. For  eczema      . meclizine (ANTIVERT) 25 MG tablet Take 25 mg by mouth as needed.      . Omega-3 Fatty Acids (FISH OIL) 1200 MG CAPS Take 1 capsule by mouth daily.       No current facility-administered medications on file prior to visit.    BP 122/88  Pulse 51  Temp(Src) 98.1 F (36.7 C) (Oral)  Resp 16  Ht 5\' 6"  (1.676 m)  Wt 207 lb (93.895 kg)  BMI 33.43 kg/m2  SpO2 99%    Objective:   Physical Exam  Constitutional: He is oriented to person, place, and time. He appears well-developed and well-nourished. No distress.  HENT:  Head: Normocephalic and atraumatic.  Cardiovascular: Normal rate and regular rhythm.   No murmur heard. Pulmonary/Chest: Effort normal and breath sounds normal. No respiratory distress. He has no wheezes. He has no rales. He exhibits no tenderness.  Neurological: He is alert and oriented to person, place, and time.  Psychiatric: He has a normal mood and affect. His behavior is normal. Judgment and thought content normal.            Assessment & Plan:

## 2013-09-09 NOTE — Patient Instructions (Signed)
Schedule medicare wellness at your earliest convenience. Complete lab work prior to leaving.

## 2013-09-10 LAB — BASIC METABOLIC PANEL WITH GFR
BUN: 19 mg/dL (ref 6–23)
CO2: 29 meq/L (ref 19–32)
Calcium: 9.2 mg/dL (ref 8.4–10.5)
Chloride: 103 meq/L (ref 96–112)
Creat: 1.24 mg/dL (ref 0.50–1.35)
Glucose, Bld: 73 mg/dL (ref 70–99)
Potassium: 4.5 meq/L (ref 3.5–5.3)
Sodium: 141 meq/L (ref 135–145)

## 2013-09-12 ENCOUNTER — Telehealth: Payer: Self-pay | Admitting: *Deleted

## 2013-09-12 MED ORDER — NEBIVOLOL HCL 10 MG PO TABS: 20.0000 mg | ORAL_TABLET | Freq: Every day | ORAL | Status: DC

## 2013-09-12 NOTE — Telephone Encounter (Signed)
Rx request to pharmacy/SLS  

## 2013-09-26 DIAGNOSIS — R42 Dizziness and giddiness: Secondary | ICD-10-CM | POA: Diagnosis not present

## 2013-11-01 ENCOUNTER — Telehealth: Payer: Self-pay | Admitting: *Deleted

## 2013-11-01 DIAGNOSIS — I10 Essential (primary) hypertension: Secondary | ICD-10-CM

## 2013-11-01 MED ORDER — VALSARTAN 160 MG PO TABS: 160.0000 mg | ORAL_TABLET | Freq: Every day | ORAL | Status: DC

## 2013-11-01 NOTE — Telephone Encounter (Signed)
D/c benicar, start generic diovan, follow up in 2 weeks for BP recheck and bmet.

## 2013-11-01 NOTE — Telephone Encounter (Signed)
Pt called requesting cheaper alternative for benicar. States pharmacist recommended generic diovan or avapro.  Please advise if one of these would be appropriate?

## 2013-11-02 NOTE — Telephone Encounter (Signed)
Pt notified of medication change, appt scheduled for bp check and bmet ordered

## 2013-11-14 ENCOUNTER — Ambulatory Visit (INDEPENDENT_AMBULATORY_CARE_PROVIDER_SITE_OTHER): Payer: Medicare Other | Admitting: *Deleted

## 2013-11-14 VITALS — BP 148/84

## 2013-11-14 DIAGNOSIS — I10 Essential (primary) hypertension: Secondary | ICD-10-CM | POA: Diagnosis not present

## 2013-11-16 NOTE — Progress Notes (Signed)
Subjective:    Patient ID: Gerald [name], male    DOB: 1945/07/24, 68 y.o.   MRN: [id number]  HPI    Review of Systems     Objective:   Physical Exam        Assessment & Plan:  Call Documentation      Jolinda Croak, CMA at 11/02/2013 10:00 AM      Status: Signed            Pt notified of medication change, appt scheduled for bp check and bmet ordered        Sandford Craze, NP at 11/01/2013  5:10 PM      Status: Signed            D/c benicar, start generic diovan, follow up in 2 weeks for BP recheck and bmet.        Kathi Simpers, CMA at 11/01/2013  3:51 PM      Status: Signed            Pt called requesting cheaper alternative for benicar. States pharmacist recommended generic diovan or avapro.  Please advise if one of these would be appropriate?    Pt presented for 2-Wk BP check, readings were elevated; pt reports that he D/C the Valsartan & Bystolic and had resumed taking Benicar 40 mg. Patient was instructed per provider to resume the medications px by PCP; pt declined, stating that he would stay on the Benicar, informed him that this information would be forwarded to his PCP/SLS

## 2013-11-24 DIAGNOSIS — R972 Elevated prostate specific antigen [PSA]: Secondary | ICD-10-CM | POA: Diagnosis not present

## 2013-11-29 ENCOUNTER — Ambulatory Visit (INDEPENDENT_AMBULATORY_CARE_PROVIDER_SITE_OTHER): Payer: Medicare Other | Admitting: Gastroenterology

## 2013-11-29 ENCOUNTER — Encounter: Payer: Self-pay | Admitting: Gastroenterology

## 2013-11-29 ENCOUNTER — Other Ambulatory Visit (INDEPENDENT_AMBULATORY_CARE_PROVIDER_SITE_OTHER): Payer: Medicare Other

## 2013-11-29 VITALS — BP 110/68 | HR 60 | Ht 64.75 in | Wt 206.2 lb

## 2013-11-29 DIAGNOSIS — K3189 Other diseases of stomach and duodenum: Secondary | ICD-10-CM

## 2013-11-29 DIAGNOSIS — R1013 Epigastric pain: Secondary | ICD-10-CM

## 2013-11-29 LAB — CBC WITH DIFFERENTIAL/PLATELET
BASOS PCT: 0.5 % (ref 0.0–3.0)
Basophils Absolute: 0 10*3/uL (ref 0.0–0.1)
Eosinophils Absolute: 0 10*3/uL (ref 0.0–0.7)
Eosinophils Relative: 0.6 % (ref 0.0–5.0)
HCT: 44.1 % (ref 39.0–52.0)
Hemoglobin: 15 g/dL (ref 13.0–17.0)
LYMPHS ABS: 2.1 10*3/uL (ref 0.7–4.0)
LYMPHS PCT: 27 % (ref 12.0–46.0)
MCHC: 34.1 g/dL (ref 30.0–36.0)
MCV: 95.3 fl (ref 78.0–100.0)
Monocytes Absolute: 0.6 10*3/uL (ref 0.1–1.0)
Monocytes Relative: 8.3 % (ref 3.0–12.0)
Neutro Abs: 4.9 10*3/uL (ref 1.4–7.7)
Neutrophils Relative %: 63.6 % (ref 43.0–77.0)
Platelets: 143 10*3/uL — ABNORMAL LOW (ref 150.0–400.0)
RBC: 4.63 Mil/uL (ref 4.22–5.81)
RDW: 13.9 % (ref 11.5–15.5)
WBC: 7.8 10*3/uL (ref 4.0–10.5)

## 2013-11-29 LAB — COMPREHENSIVE METABOLIC PANEL
ALT: 41 U/L (ref 0–53)
AST: 37 U/L (ref 0–37)
Albumin: 4.1 g/dL (ref 3.5–5.2)
Alkaline Phosphatase: 69 U/L (ref 39–117)
BUN: 21 mg/dL (ref 6–23)
CALCIUM: 9.4 mg/dL (ref 8.4–10.5)
CHLORIDE: 104 meq/L (ref 96–112)
CO2: 31 mEq/L (ref 19–32)
Creatinine, Ser: 1.4 mg/dL (ref 0.4–1.5)
GFR: 53.69 mL/min — AB (ref >60.00)
GLUCOSE: 83 mg/dL (ref 70–99)
Potassium: 4.2 mEq/L (ref 3.5–5.1)
Sodium: 141 mEq/L (ref 135–145)
Total Bilirubin: 0.8 mg/dL (ref 0.2–1.2)
Total Protein: 7.1 g/dL (ref 6.0–8.3)

## 2013-11-29 NOTE — Patient Instructions (Signed)
You will have labs checked today in the basement lab.  Please head down after you check out with the front desk  (cbc, cmet). Please start taking citrucel (orange flavored) powder fiber supplement.  This may cause some bloating at first but that usually goes away. Begin with a small spoonful and work your way up to a large, heaping spoonful daily over a week. You will be set up for an upper endoscopy for dyspepsia.

## 2013-11-29 NOTE — Progress Notes (Signed)
HPI: This is a   very pleasant 68 year old man whom I am meeting for the first time today.  He had daily indigestion a couple months ago.  A general blah feeling.  Can feel full.  A bit of pyrosis.   Rare dyphagia, not 1-2 times in a year.  Overall stable weight.  No overt bleeding.  No significant abd pains.  Can have loose stools, can have difficulty with constipation.  Usually loose.  But rarely has to really push, strain.  Never tried fiber supplement.  11/2006 colonoscopy Dr. Sharlett Iles for FOBT + stool, diverticulosis only.    His GM had colon cancer.  Review of systems: Pertinent positive and negative review of systems were noted in the above HPI section. Complete review of systems was performed and was otherwise normal.    Past Medical History  Diagnosis Date  . Hypertension   . Hyperlipidemia   . 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 Alcohol abuse     quit 1989  . Hiatal hernia     EGD 3/10/3 stricture  . OSA (obstructive sleep apnea)     mild, per sleep study  . Onychomycosis   . Diverticulosis   . Hemorrhoids   . PVD (peripheral vascular disease)   . Fatty liver   . Hx of adenomatous colonic polyps   . GERD (gastroesophageal reflux disease)   . Esophageal stricture   . Insomnia   . Back pain, chronic     Past Surgical History  Procedure Laterality Date  . Tonsillectomy and adenoidectomy    . Cystectomy      Right leg remotely, back 2012  . Mass removal      left shoulder  . Wisdom tooth extraction      Current Outpatient Prescriptions  Medication Sig Dispense Refill  . aspirin 81 MG tablet Take 81 mg by mouth every other day.      . ketoconazole (NIZORAL) 2 % shampoo Apply topically as needed. For eczema      . Misc Natural Products (MENS PROSTATE HEALTH FORMULA PO) Take 1 tablet by mouth 3 (three) times daily.      . Multiple Vitamins-Minerals (MULTIVITAMIN ADULTS 50+ PO) Take 1 tablet by mouth daily.      . nebivolol  (BYSTOLIC) 10 MG tablet Take 2 tablets (20 mg total) by mouth daily.  60 tablet  2  . olmesartan (BENICAR) 40 MG tablet Take 40 mg by mouth daily.      . Omega-3 Fatty Acids (FISH OIL) 1200 MG CAPS Take 1 capsule by mouth daily.       No current facility-administered medications for this visit.    Allergies as of 11/29/2013 - Review Complete 11/29/2013  Allergen Reaction Noted  . Penicillins      Family History  Problem Relation Age of Onset  . Coronary artery disease Father   . Colon cancer Father   . Heart attack Paternal Grandfather   . Coronary artery disease Maternal Grandmother   . Colon cancer Maternal Grandmother   . Cancer Maternal Grandmother     colon  . Hypertension Maternal Aunt   . Diabetes Neg Hx   . Prostate cancer Neg Hx   . Stroke Neg Hx   . Melanoma Mother   . Coronary artery disease Paternal Grandmother   . Heart attack Maternal Grandfather     History   Social History  . Marital Status: Married    Spouse  Name: N/A    Number of Children: 2  . Years of Education: N/A   Occupational History  . Retired    Social History Main Topics  . Smoking status: Former Smoker    Quit date: 08/29/2006  . Smokeless tobacco: Never Used  . Alcohol Use: No     Comment: quit alcohol 1989  . Drug Use: No  . Sexual Activity: Not on file   Other Topics Concern  . Not on file   Social History Narrative   Diet: regular, lots of vegetables-----exercise : none but active   Caffeine use:  5 cups coffee daily             Physical Exam: BP 110/68  Pulse 60  Ht 5' 4.75" (1.645 m)  Wt 206 lb 4 oz (93.554 kg)  BMI 34.57 kg/m2 Constitutional: generally well-appearing Psychiatric: alert and oriented x3 Eyes: extraocular movements intact Mouth: oral pharynx moist, no lesions Neck: supple no lymphadenopathy Cardiovascular: heart regular rate and rhythm Lungs: clear to auscultation bilaterally Abdomen: soft, nontender, nondistended, no obvious ascites, no  peritoneal signs, normal bowel sounds Extremities: no lower extremity edema bilaterally Skin: no lesions on visible extremities    Assessment and plan: 68 y.o. male with  intermittent solid food dysphagia, recent dyspepsia, alternating bowel habits  For his alternating bowel habits I recommended he tried fiber supplements to see if that'll even him out. He has had dyspepsia and rare intermittent dysphasia. I did not mention above that he did try proton pump inhibitor and that has not helped him. He said actually made matters worse. I would like to see with EGD at his soonest convenience to evaluate for dyspepsia. He will also have a basic set of labs including CBC, complete metabolic profile.

## 2013-12-01 DIAGNOSIS — N400 Enlarged prostate without lower urinary tract symptoms: Secondary | ICD-10-CM | POA: Diagnosis not present

## 2013-12-01 DIAGNOSIS — R972 Elevated prostate specific antigen [PSA]: Secondary | ICD-10-CM | POA: Diagnosis not present

## 2014-01-27 DIAGNOSIS — Z23 Encounter for immunization: Secondary | ICD-10-CM | POA: Diagnosis not present

## 2014-02-03 ENCOUNTER — Other Ambulatory Visit: Payer: Self-pay | Admitting: Family

## 2014-02-03 NOTE — Telephone Encounter (Signed)
30 day supply Bystolic sent to pharmacy. Pt due for medicare wellness exam before further refills due.  Please advise.

## 2014-02-15 ENCOUNTER — Ambulatory Visit (AMBULATORY_SURGERY_CENTER): Payer: Medicare Other | Admitting: Gastroenterology

## 2014-02-15 ENCOUNTER — Encounter: Payer: Self-pay | Admitting: Gastroenterology

## 2014-02-15 VITALS — BP 135/86 | HR 55 | Temp 96.6°F | Resp 15 | Ht 64.75 in | Wt 206.0 lb

## 2014-02-15 DIAGNOSIS — R1013 Epigastric pain: Secondary | ICD-10-CM

## 2014-02-15 DIAGNOSIS — K209 Esophagitis, unspecified without bleeding: Secondary | ICD-10-CM

## 2014-02-15 DIAGNOSIS — K30 Functional dyspepsia: Secondary | ICD-10-CM | POA: Diagnosis not present

## 2014-02-15 DIAGNOSIS — I739 Peripheral vascular disease, unspecified: Secondary | ICD-10-CM | POA: Diagnosis not present

## 2014-02-15 DIAGNOSIS — K2970 Gastritis, unspecified, without bleeding: Secondary | ICD-10-CM | POA: Diagnosis not present

## 2014-02-15 DIAGNOSIS — K2950 Unspecified chronic gastritis without bleeding: Secondary | ICD-10-CM | POA: Diagnosis not present

## 2014-02-15 DIAGNOSIS — K2990 Gastroduodenitis, unspecified, without bleeding: Secondary | ICD-10-CM

## 2014-02-15 DIAGNOSIS — I10 Essential (primary) hypertension: Secondary | ICD-10-CM | POA: Diagnosis not present

## 2014-02-15 MED ORDER — SODIUM CHLORIDE 0.9 % IV SOLN: 500.0000 mL | INTRAVENOUS | Status: DC

## 2014-02-15 NOTE — Op Note (Signed)
[place]  [address]. [place], [address]   ENDOSCOPY PROCEDURE REPORT  PATIENT: Gerald, [name]  MR#: [id number] BIRTHDATE: Jul 22, 1945 , 86  yrs. old GENDER: male ENDOSCOPIST: Milus Banister, MD REFERRED BY:  Debbrah Alar, FNP PROCEDURE DATE:  02/15/2014 PROCEDURE:  EGD w/ biopsy ASA CLASS:     Class II INDICATIONS:  dyspepsia, intermittent loose stools, intermittent constipation. MEDICATIONS: Monitored anesthesia care and Propofol 120 mg IV TOPICAL ANESTHETIC: none  DESCRIPTION OF PROCEDURE: After the risks benefits and alternatives of the procedure were thoroughly explained, informed consent was obtained.  The LB [id number] endoscope was introduced through the mouth and advanced to the second portion of the duodenum , Without limitations.  The instrument was slowly withdrawn as the mucosa was fully examined.   There was moderate non-specific pan-gastritis.  The distal stomach was biopsied and sent to pathology.  There was erosive, reflux related esophagitis (1cm long single erosion at GE junction).  The examination was otherwise normal.  Retroflexed views revealed no abnormalities.     The scope was then withdrawn from the patient and the procedure completed.  COMPLICATIONS: There were no immediate complications.  ENDOSCOPIC IMPRESSION: There was moderate non-specific pan-gastritis.  The distal stomach was biopsied and sent to pathology.  There was erosive, reflux related esophagitis (1cm long single erosion at GE junction).  The examination was otherwise normal  RECOMMENDATIONS: If the biopsies show H.  pylori, you will be started on the correct antibiotics.  For now, please start once daily OTC omeprazole (20mg  pill once daily, shortly before BF meal).  Please also start once daily fiber supplement with citrucel for your abnormal bowel pattern.   eSigned:  Milus Banister, MD 02/15/2014 8:42 AM

## 2014-02-15 NOTE — Progress Notes (Signed)
Report to PACU, RN, vss, BBS= Clear.  

## 2014-02-15 NOTE — Progress Notes (Signed)
Called to room to assist during endoscopic procedure.  Patient ID and intended procedure confirmed with present staff. Received instructions for my participation in the procedure from the performing physician.  

## 2014-02-15 NOTE — Patient Instructions (Signed)
Discharge instructions given. Biopsies taken. Resume previous medications. YOU HAD AN ENDOSCOPIC PROCEDURE TODAY AT THE [facility]: Refer to the procedure report that was given to you for any specific questions about what was found during the examination.  If the procedure report does not answer your questions, please call your gastroenterologist to clarify.  If you requested that your care partner not be given the details of your procedure findings, then the procedure report has been included in a sealed envelope for you to review at your convenience later.  YOU SHOULD EXPECT: Some feelings of bloating in the abdomen. Passage of more gas than usual.  Walking can help get rid of the air that was put into your GI tract during the procedure and reduce the bloating. If you had a lower endoscopy (such as a colonoscopy or flexible sigmoidoscopy) you may notice spotting of blood in your stool or on the toilet paper. If you underwent a bowel prep for your procedure, then you may not have a normal bowel movement for a few days.  DIET: Your first meal following the procedure should be a light meal and then it is ok to progress to your normal diet.  A half-sandwich or bowl of soup is an example of a good first meal.  Heavy or fried foods are harder to digest and may make you feel nauseous or bloated.  Likewise meals heavy in dairy and vegetables can cause extra gas to form and this can also increase the bloating.  Drink plenty of fluids but you should avoid alcoholic beverages for 24 hours.  ACTIVITY: Your care partner should take you home directly after the procedure.  You should plan to take it easy, moving slowly for the rest of the day.  You can resume normal activity the day after the procedure however you should NOT DRIVE or use heavy machinery for 24 hours (because of the sedation medicines used during the test).    SYMPTOMS TO REPORT IMMEDIATELY: A gastroenterologist can be reached at any  hour.  During normal business hours, 8:30 AM to 5:00 PM Monday through Friday, call [phone].  After hours and on weekends, please call the GI answering service at [phone] who will take a message and have the physician on call contact you.   Following upper endoscopy (EGD)  Vomiting of blood or coffee ground material  New chest pain or pain under the shoulder blades  Painful or persistently difficult swallowing  New shortness of breath  Fever of 100F or higher  Black, tarry-looking stools  FOLLOW UP: If any biopsies were taken you will be contacted by phone or by letter within the next 1-3 weeks.  Call your gastroenterologist if you have not heard about the biopsies in 3 weeks.  Our staff will call the home number listed on your records the next business day following your procedure to check on you and address any questions or concerns that you may have at that time regarding the information given to you following your procedure. This is a courtesy call and so if there is no answer at the home number and we have not heard from you through the emergency physician on call, we will assume that you have returned to your regular daily activities without incident.  SIGNATURES/CONFIDENTIALITY: You and/or your care partner have signed paperwork which will be entered into your electronic medical record.  These signatures attest to the fact that that the information above on your After Visit Summary has   has been reviewed and is understood.  Full responsibility of the confidentiality of this discharge information lies with you and/or your care-partner. 

## 2014-02-15 NOTE — Telephone Encounter (Signed)
appt scheduled for pt.  

## 2014-02-16 ENCOUNTER — Telehealth: Payer: Self-pay | Admitting: *Deleted

## 2014-02-16 NOTE — Telephone Encounter (Signed)
Telephone busy x 3. Unable to leave message.

## 2014-03-02 ENCOUNTER — Other Ambulatory Visit: Payer: Self-pay | Admitting: Family

## 2014-03-15 ENCOUNTER — Encounter: Payer: Self-pay | Admitting: Family

## 2014-03-15 ENCOUNTER — Ambulatory Visit (INDEPENDENT_AMBULATORY_CARE_PROVIDER_SITE_OTHER): Payer: Medicare Other | Admitting: Family

## 2014-03-15 VITALS — BP 126/68 | HR 52 | Temp 97.9°F | Resp 16 | Ht 66.5 in | Wt 209.8 lb

## 2014-03-15 DIAGNOSIS — E785 Hyperlipidemia, unspecified: Secondary | ICD-10-CM | POA: Diagnosis not present

## 2014-03-15 DIAGNOSIS — I10 Essential (primary) hypertension: Secondary | ICD-10-CM | POA: Diagnosis not present

## 2014-03-15 DIAGNOSIS — Z23 Encounter for immunization: Secondary | ICD-10-CM

## 2014-03-15 LAB — HEPATIC FUNCTION PANEL
ALK PHOS: 74 U/L (ref 39–117)
ALT: 41 U/L (ref 0–53)
AST: 26 U/L (ref 0–37)
Albumin: 4 g/dL (ref 3.5–5.2)
BILIRUBIN TOTAL: 0.7 mg/dL (ref 0.2–1.2)
Bilirubin, Direct: 0 mg/dL (ref 0.0–0.3)
Total Protein: 7.1 g/dL (ref 6.0–8.3)

## 2014-03-15 LAB — LIPID PANEL
Cholesterol: 191 mg/dL (ref 0–200)
HDL: 33.8 mg/dL — ABNORMAL LOW (ref >39.00)
LDL Cholesterol: 130 mg/dL — ABNORMAL HIGH (ref 0–99)
NonHDL: 157.2
Total CHOL/HDL Ratio: 6
Triglycerides: 134 mg/dL (ref 0.0–149.0)
VLDL: 26.8 mg/dL (ref 0.0–40.0)

## 2014-03-15 LAB — BASIC METABOLIC PANEL
BUN: 17 mg/dL (ref 6–23)
CALCIUM: 9 mg/dL (ref 8.4–10.5)
CO2: 28 mEq/L (ref 19–32)
CREATININE: 1 mg/dL (ref 0.4–1.5)
Chloride: 106 mEq/L (ref 96–112)
GFR: 81.93 mL/min (ref >60.00)
GLUCOSE: 89 mg/dL (ref 70–99)
Potassium: 4 mEq/L (ref 3.5–5.1)
Sodium: 137 mEq/L (ref 135–145)

## 2014-03-15 NOTE — Progress Notes (Signed)
Subjective:    Gerald [name] is a 68 y.o. male who presents for Medicare Annual/Subsequent preventive examination.   Preventive Screening-Counseling & Management  Tobacco History  Smoking status  . Former Smoker  . Quit date: 08/29/2006  Smokeless tobacco  . Never Used    Problems Prior to Visit 1. Elevated PSA- monitored by urology- Dr. Brunilda Payor. Reports stable urination on flomax.  2.  GERD- maintained on PPI. Follows with Dr. Christella Hartigan.  3.  HTN- maintained on benicar.  Denies chest pain, shortness of breath or swelling.  BP Readings from Last 3 Encounters:  03/15/14 126/68  02/15/14 135/86  11/29/13 110/68     Current Problems (verified) Patient Active Problem List   Diagnosis Date Noted  . Indigestion 09/09/2013  . Upper respiratory infection 03/02/2013  . Cough 05/10/2012  . Tinnitus 02/09/2012  . HTN (hypertension) 09/01/2011  . Plantar fasciitis of left foot 08/29/2011  . Elevated PSA 05/07/2011  . Skin lesion of face 04/22/2011  . GERD (gastroesophageal reflux disease) 01/14/2011  . Family history of colon cancer 01/14/2011  . History of colon polyps 01/14/2011  . Fatty liver disease, nonalcoholic 01/14/2011  . ESOPHAGEAL STRICTURE 11/27/2008  . COLONIC POLYPS, ADENOMATOUS, HX OF 11/27/2008  . ALCOHOL ABUSE, HX OF 09/16/2007  . DIVERTICULOSIS, COLON 07/16/2007  . BACK PAIN, CHRONIC 07/16/2007  . HYPERLIPIDEMIA 08/10/2006    Medications Prior to Visit Current Outpatient Prescriptions on File Prior to Visit  Medication Sig Dispense Refill  . aspirin 81 MG tablet Take 81 mg by mouth every other day.    Marland Kitchen BYSTOLIC 10 MG tablet TAKE 2 TABLET BY MOUTH DAILY 60 tablet 0  . ketoconazole (NIZORAL) 2 % shampoo Apply topically as needed. For eczema    . Misc Natural Products (MENS PROSTATE HEALTH FORMULA PO) Take 1 tablet by mouth 3 (three) times daily.    . Multiple Vitamins-Minerals (MULTIVITAMIN ADULTS 50+ PO) Take 1 tablet by mouth daily.    Marland Kitchen olmesartan  (BENICAR) 40 MG tablet Take 40 mg by mouth daily.    . Omega-3 Fatty Acids (FISH OIL) 1200 MG CAPS Take 1 capsule by mouth daily.     No current facility-administered medications on file prior to visit.    Current Medications (verified) Current Outpatient Prescriptions  Medication Sig Dispense Refill  . aspirin 81 MG tablet Take 81 mg by mouth every other day.    Marland Kitchen BYSTOLIC 10 MG tablet TAKE 2 TABLET BY MOUTH DAILY 60 tablet 0  . ketoconazole (NIZORAL) 2 % shampoo Apply topically as needed. For eczema    . Misc Natural Products (MENS PROSTATE HEALTH FORMULA PO) Take 1 tablet by mouth 3 (three) times daily.    . Multiple Vitamins-Minerals (MULTIVITAMIN ADULTS 50+ PO) Take 1 tablet by mouth daily.    Marland Kitchen olmesartan (BENICAR) 40 MG tablet Take 40 mg by mouth daily.    . Omega-3 Fatty Acids (FISH OIL) 1200 MG CAPS Take 1 capsule by mouth daily.    Marland Kitchen omeprazole (PRILOSEC) 40 MG capsule Take 40 mg by mouth daily.    . tamsulosin (FLOMAX) 0.4 MG CAPS capsule Take 0.4 mg by mouth daily.     No current facility-administered medications for this visit.     Allergies (verified) Penicillins   PAST HISTORY  Family History Family History  Problem Relation Age of Onset  . Coronary artery disease Father   . Colon cancer Father   . Heart attack Paternal Grandfather   . Coronary artery disease Maternal Grandmother   .  Colon cancer Maternal Grandmother   . Cancer Maternal Grandmother     colon  . Hypertension Maternal Aunt   . Prostate cancer Neg Hx   . Stroke Neg Hx   . Esophageal cancer Neg Hx   . Pancreatic cancer Neg Hx   . Rectal cancer Neg Hx   . Stomach cancer Neg Hx   . Melanoma Mother   . Coronary artery disease Paternal Grandmother   . Heart attack Maternal Grandfather     Social History History  Substance Use Topics  . Smoking status: Former Smoker    Quit date: 08/29/2006  . Smokeless tobacco: Never Used  . Alcohol Use: No     Comment: quit alcohol 1989    Are there  smokers in your home (other than you)?  No  Risk Factors Current exercise habits: none  Dietary issues discussed: reports healthy diet, portions too large   Cardiac risk factors: advanced age (older than 91 for men, 86 for women), hypertension, male gender, obesity (BMI >= 30 kg/m2) and smoking/ tobacco exposure.  Activities of Daily Living In your present state of health, do you have any difficulty performing the following activities?:  Driving? No Managing money?  No Feeding yourself? No Getting from bed to chair? No Climbing a flight of stairs? No Preparing food and eating?: No Bathing or showering? No Getting dressed: No Getting to the toilet? No Using the toilet:No Moving around from place to place: No In the past year have you fallen or had a near fall?:No   Are you sexually active?  no  Do you have more than one partner? no  Hearing Difficulties: No Do you often ask people to speak up or repeat themselves? No Do you experience ringing or noises in your ears? occasional Do you have difficulty understanding soft or whispered voices? No   Do you feel that you have a problem with memory? No  Do you often misplace items? No  Do you feel safe at home?  Yes  Cognitive Testing  Alert? Yes  Normal Appearance?Yes  Oriented to person? Yes  Place? Yes   Time? Yes  Recall of three objects?  Yes  Can perform simple calculations? Yes  Displays appropriate judgment?Yes  Can read the correct time from a watch face?Yes   Advanced Directives have been discussed with the patient? Yes   List the Names of Other Physician/Practitioners you currently use: 1.    Indicate any recent Medical Services you may have received from other than [facility] providers in the past year (date may be approximate).  Immunization History  Administered Date(s) Administered  . Influenza Split 12/30/2010, 01/06/2012  . Influenza Whole 02/18/2007, 03/13/2009, 01/31/2010  . Influenza-Unspecified  01/29/2013, 12/29/2013  . Pneumococcal Polysaccharide-23 05/29/2005, 01/06/2012  . Td 04/20/2001  . Zoster 08/27/2011    Screening Tests Health Maintenance  Topic Date Due  . TETANUS/TDAP  04/21/2011  . INFLUENZA VACCINE  10/30/2014  . COLONOSCOPY  02/16/2024  . PNEUMOCOCCAL POLYSACCHARIDE VACCINE AGE 28 AND OVER  Completed  . ZOSTAVAX  Addressed    All answers were reviewed with the patient and necessary referrals were made:  Gerald [name],Gerald Maniscalco S., NP   03/15/2014   History reviewed: allergies, current medications, past family history, past medical history, past social history, past surgical history and problem list  Review of Systems Pertinent items are noted in HPI.    Objective:     Vision by Snellen chart: right RUE:AVWUJWJ declines measurement, left XBJ:YNWGNFA declines measurement Blood pressure  126/68, pulse 52, temperature 97.9 F (36.6 C), temperature source Oral, resp. rate 16, height 5' 6.5" (1.689 m), weight 209 lb 12.8 oz (95.165 kg), SpO2 99 %. Body mass index is 33.36 kg/(m^2).  Physical Exam  Constitutional: overweight white male. He is oriented to person, place, and time. He appears well-developed and well-nourished. No distress.  HENT:  Head: Normocephalic and atraumatic.  Right Ear: Tympanic membrane and ear canal normal.  Left Ear: Tympanic membrane and ear canal normal.  Mouth/Throat: Oropharynx is clear and moist.  Eyes: Pupils are equal, round, and reactive to light. No scleral icterus.  Neck: Normal range of motion. No thyromegaly present.  Cardiovascular: Normal rate and regular rhythm.   No murmur heard. Pulmonary/Chest: Effort normal and breath sounds normal. No respiratory distress. He has no wheezes. He has no rales. He exhibits no tenderness.  Abdominal: Soft. Bowel sounds are normal. He exhibits no distension and no mass. There is no tenderness. There is no rebound and no guarding.  Musculoskeletal: He exhibits no edema.  Lymphadenopathy:     He has no cervical adenopathy.  Neurological: He is alert and oriented to person, place, and time.  He exhibits normal muscle tone. Coordination normal.  Skin: Skin is warm and dry.  Psychiatric: He has a normal mood and affect. His behavior is normal. Judgment and thought content normal.          Assessment & Plan:        Assessment:          Plan:     During the course of the visit the patient was educated and counseled about appropriate screening and preventive services including:    Pneumococcal vaccine    prevnar  Diet review for nutrition referral? Yes ____  Not Indicated __x__   Patient Instructions (the written plan) was given to the patient.  Medicare Attestation I have personally reviewed: The patient's medical and social history Their use of alcohol, tobacco or illicit drugs Their current medications and supplements The patient's functional ability including ADLs,fall risks, home safety risks, cognitive, and hearing and visual impairment Diet and physical activities Evidence for depression or mood disorders  The patient's weight, height, BMI, and visual acuity have been recorded in the chart.  I have made referrals, counseling, and provided education to the patient based on review of the above and I have provided the patient with a written personalized care plan for preventive services.     Lemont Fillers., NP   03/15/2014      Patient ID: Rollene Rotunda, male   DOB: 08/20/1945, 68 y.o.   MRN: [id number]

## 2014-03-15 NOTE — Progress Notes (Signed)
Pre visit review using our clinic review tool, if applicable. No additional management support is needed unless otherwise documented below in the visit note. 

## 2014-03-15 NOTE — Patient Instructions (Signed)
Please complete lab work prior to leaving. Please schedule a follow up appointment in 6 months.

## 2014-03-16 ENCOUNTER — Encounter: Payer: Self-pay | Admitting: Family

## 2014-04-03 ENCOUNTER — Other Ambulatory Visit: Payer: Self-pay | Admitting: Family

## 2014-05-25 DIAGNOSIS — R972 Elevated prostate specific antigen [PSA]: Secondary | ICD-10-CM | POA: Diagnosis not present

## 2014-06-01 DIAGNOSIS — R3915 Urgency of urination: Secondary | ICD-10-CM | POA: Diagnosis not present

## 2014-06-01 DIAGNOSIS — N401 Enlarged prostate with lower urinary tract symptoms: Secondary | ICD-10-CM | POA: Diagnosis not present

## 2014-06-01 DIAGNOSIS — R351 Nocturia: Secondary | ICD-10-CM | POA: Diagnosis not present

## 2014-06-01 DIAGNOSIS — R972 Elevated prostate specific antigen [PSA]: Secondary | ICD-10-CM | POA: Diagnosis not present

## 2014-06-01 DIAGNOSIS — R350 Frequency of micturition: Secondary | ICD-10-CM | POA: Diagnosis not present

## 2014-08-01 ENCOUNTER — Other Ambulatory Visit: Payer: Self-pay | Admitting: Family

## 2014-08-04 DIAGNOSIS — L814 Other melanin hyperpigmentation: Secondary | ICD-10-CM | POA: Diagnosis not present

## 2014-09-15 ENCOUNTER — Ambulatory Visit: Payer: Medicare Other | Admitting: Family

## 2014-09-18 ENCOUNTER — Encounter: Payer: Self-pay | Admitting: Family

## 2014-09-18 ENCOUNTER — Ambulatory Visit (INDEPENDENT_AMBULATORY_CARE_PROVIDER_SITE_OTHER): Payer: Medicare Other | Admitting: Family

## 2014-09-18 VITALS — BP 134/80 | HR 75 | Temp 98.1°F | Resp 16 | Ht 66.5 in | Wt 209.0 lb

## 2014-09-18 DIAGNOSIS — I10 Essential (primary) hypertension: Secondary | ICD-10-CM

## 2014-09-18 DIAGNOSIS — E785 Hyperlipidemia, unspecified: Secondary | ICD-10-CM

## 2014-09-18 DIAGNOSIS — K219 Gastro-esophageal reflux disease without esophagitis: Secondary | ICD-10-CM | POA: Diagnosis not present

## 2014-09-18 LAB — BASIC METABOLIC PANEL
BUN: 20 mg/dL (ref 6–23)
CHLORIDE: 103 meq/L (ref 96–112)
CO2: 32 meq/L (ref 19–32)
Calcium: 9.6 mg/dL (ref 8.4–10.5)
Creatinine, Ser: 0.94 mg/dL (ref 0.40–1.50)
GFR: 84.82 mL/min (ref >60.00)
Glucose, Bld: 89 mg/dL (ref 70–99)
Potassium: 4.5 mEq/L (ref 3.5–5.1)
SODIUM: 138 meq/L (ref 135–145)

## 2014-09-18 MED ORDER — OMEPRAZOLE 20 MG PO CPDR: 20.0000 mg | DELAYED_RELEASE_CAPSULE | Freq: Every day | ORAL | Status: DC

## 2014-09-18 NOTE — Progress Notes (Signed)
Pre visit review using our clinic review tool, if applicable. No additional management support is needed unless otherwise documented below in the visit note. 

## 2014-09-18 NOTE — Assessment & Plan Note (Signed)
Fair control of cholesterol, continue fish oil.

## 2014-09-18 NOTE — Progress Notes (Signed)
Subjective:    Patient ID: Gerald [name], male    DOB: 09/12/1945, 69 y.o.   MRN: [id number]  HPI  [name] is a 69 yr old male who presents today for follow up.  Patient presents today for follow up of multiple medical problems.  Hyperlipidemia  Patient is currently maintained on the following medication for hyperlipidemia: fish oil, low cholesterol diet Last lipid panel as follows:  Lab Results  Component Value Date   CHOL 191 03/15/2014   HDL 33.80* 03/15/2014   LDLCALC 130* 03/15/2014   LDLDIRECT 140.0 08/29/2010   TRIG 134.0 03/15/2014   CHOLHDL 6 03/15/2014   Patient denies myalgia. Patient reports good compliance with low fat/low cholesterol diet.   Hypertension  Patient is currently maintained on the following medications for blood pressure: benicar/bystolic Patient reports good compliance with blood pressure medications. Patient denies chest pain, shortness of breath or swelling. Last 3 blood pressure readings in our office are as follows: BP Readings from Last 3 Encounters:  09/18/14 134/80  03/15/14 126/68  02/15/14 135/86   GERD- he is using otc omeprazole 20mg  once daily.    Review of Systems See HPI  Past Medical History  Diagnosis Date  . Hypertension   . Hyperlipidemia   . 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 Alcohol abuse     quit 1989  . Hiatal hernia     EGD 3/10/3 stricture  . OSA (obstructive sleep apnea)     mild, per sleep study  . Onychomycosis   . Diverticulosis   . Hemorrhoids   . PVD (peripheral vascular disease)   . Fatty liver   . Hx of adenomatous colonic polyps   . GERD (gastroesophageal reflux disease)   . Esophageal stricture   . Insomnia   . Back pain, chronic   . Sleep apnea     does not wear of c-pap  . Depression     History   Social History  . Marital Status: Married    Spouse Name: N/A  . Number of Children: 2  . Years of Education: N/A   Occupational History    . Retired    Social History Main Topics  . Smoking status: Former Smoker    Quit date: 08/29/2006  . Smokeless tobacco: Never Used  . Alcohol Use: No     Comment: quit alcohol 1989  . Drug Use: No  . Sexual Activity: Not on file   Other Topics Concern  . Not on file   Social History Narrative   Diet: regular, lots of vegetables-----exercise : none but active   Caffeine use:  5 cups coffee daily          Past Surgical History  Procedure Laterality Date  . Tonsillectomy and adenoidectomy    . Cystectomy      Right leg remotely, back 2012  . Mass removal      left shoulder  . Wisdom tooth extraction    . Colonoscopy    . Upper gastrointestinal endoscopy      Family History  Problem Relation Age of Onset  . Coronary artery disease Father   . Colon cancer Father   . Heart attack Paternal Grandfather   . Coronary artery disease Maternal Grandmother   . Colon cancer Maternal Grandmother   . Cancer Maternal Grandmother     colon  . Hypertension Maternal Aunt   . Prostate cancer Neg Hx   .  Stroke Neg Hx   . Esophageal cancer Neg Hx   . Pancreatic cancer Neg Hx   . Rectal cancer Neg Hx   . Stomach cancer Neg Hx   . Melanoma Mother   . Coronary artery disease Paternal Grandmother   . Heart attack Maternal Grandfather     Allergies  Allergen Reactions  . Penicillins     Pt unsure of reaction    Current Outpatient Prescriptions on File Prior to Visit  Medication Sig Dispense Refill  . aspirin 81 MG tablet Take 81 mg by mouth every other day.    Marland Kitchen BENICAR 40 MG tablet TAKE 1 TABLET BY MOUTH DAILY 90 tablet 1  . BYSTOLIC 10 MG tablet TAKE 2 TABLETS BY MOUTH EVERY DAY 180 tablet 1  . ketoconazole (NIZORAL) 2 % shampoo Apply topically as needed. For eczema    . Misc Natural Products (MENS PROSTATE HEALTH FORMULA PO) Take 1 tablet by mouth 3 (three) times daily.    . Multiple Vitamins-Minerals (MULTIVITAMIN ADULTS 50+ PO) Take 1 tablet by mouth daily.    . Omega-3  Fatty Acids (FISH OIL) 1200 MG CAPS Take 1 capsule by mouth daily.    . tamsulosin (FLOMAX) 0.4 MG CAPS capsule Take 0.8 mg by mouth daily.      No current facility-administered medications on file prior to visit.    BP 134/80 mmHg  Pulse 75  Temp(Src) 98.1 F (36.7 C) (Oral)  Resp 16  Ht 5' 6.5" (1.689 m)  Wt 209 lb (94.802 kg)  BMI 33.23 kg/m2  SpO2 97%       Objective:   Physical Exam  Constitutional: He is oriented to person, place, and time. He appears well-developed and well-nourished. No distress.  HENT:  Head: Normocephalic and atraumatic.  Cardiovascular: Normal rate and regular rhythm.   No murmur heard. Pulmonary/Chest: Effort normal and breath sounds normal. No respiratory distress. He has no wheezes. He has no rales.  Musculoskeletal: He exhibits no edema.  Neurological: He is alert and oriented to person, place, and time.  Skin: Skin is warm and dry.  Psychiatric: He has a normal mood and affect. His behavior is normal. Thought content normal.          Assessment & Plan:

## 2014-09-18 NOTE — Assessment & Plan Note (Signed)
Clinically stable on PPI.  (otc omeprazole) continue same.

## 2014-09-18 NOTE — Assessment & Plan Note (Signed)
BP stable on current meds, continue same.  

## 2014-09-18 NOTE — Patient Instructions (Addendum)
Please complete lab work prior to leaving.  Follow up in 6 months for medicare wellness visit.

## 2014-09-26 ENCOUNTER — Encounter: Payer: Self-pay | Admitting: Family

## 2014-09-28 ENCOUNTER — Other Ambulatory Visit: Payer: Self-pay | Admitting: Family

## 2014-09-28 NOTE — Telephone Encounter (Signed)
Rx for Benicar requested, but not due.  Called patient who stated that he had a refill to pick up and does not need a refill at this time.

## 2014-11-30 DIAGNOSIS — R972 Elevated prostate specific antigen [PSA]: Secondary | ICD-10-CM | POA: Diagnosis not present

## 2014-12-07 DIAGNOSIS — R3915 Urgency of urination: Secondary | ICD-10-CM | POA: Diagnosis not present

## 2014-12-07 DIAGNOSIS — R351 Nocturia: Secondary | ICD-10-CM | POA: Diagnosis not present

## 2014-12-07 DIAGNOSIS — R350 Frequency of micturition: Secondary | ICD-10-CM | POA: Diagnosis not present

## 2014-12-07 DIAGNOSIS — R972 Elevated prostate specific antigen [PSA]: Secondary | ICD-10-CM | POA: Diagnosis not present

## 2014-12-07 DIAGNOSIS — N401 Enlarged prostate with lower urinary tract symptoms: Secondary | ICD-10-CM | POA: Diagnosis not present

## 2015-01-15 ENCOUNTER — Telehealth: Payer: Self-pay | Admitting: *Deleted

## 2015-01-15 MED ORDER — METOPROLOL TARTRATE 25 MG PO TABS: 25.0000 mg | ORAL_TABLET | Freq: Two times a day (BID) | ORAL | Status: DC

## 2015-01-15 MED ORDER — LOSARTAN POTASSIUM 50 MG PO TABS: 50.0000 mg | ORAL_TABLET | Freq: Every day | ORAL | Status: DC

## 2015-01-15 NOTE — Telephone Encounter (Signed)
Notified pt. He is agreeable to try new meds and requests that Rxs be mailed to him. States he will complete his current supply and will get these filled in November. Scheduled pt for f/u of BP for 02/14/15 at 2pm. Rxs printed and forwarded to PCP for signature.

## 2015-01-15 NOTE — Telephone Encounter (Signed)
D/c benicar, d/c bystolic. Try losartan and metoprolol.  Follow up in office in 2 weeks for BP check.  Hopefully these meds will be less.

## 2015-01-15 NOTE — Telephone Encounter (Signed)
Received call from pt stating cost of benicar and bystolic are going up and he would like to see if there are any tier 1 generics that would be equivalent that may not have many side effects. States he has tried some in the past (unsure of names) but was unable to tolerate them due to side effects.  Please advise.

## 2015-01-16 NOTE — Telephone Encounter (Signed)
Mailed Rx's.

## 2015-02-14 ENCOUNTER — Encounter: Payer: Self-pay | Admitting: Family

## 2015-02-14 ENCOUNTER — Ambulatory Visit (INDEPENDENT_AMBULATORY_CARE_PROVIDER_SITE_OTHER): Payer: Medicare Other | Admitting: Family

## 2015-02-14 VITALS — BP 140/80 | HR 59 | Temp 98.4°F | Resp 16 | Ht 67.0 in | Wt 209.6 lb

## 2015-02-14 DIAGNOSIS — I10 Essential (primary) hypertension: Secondary | ICD-10-CM | POA: Diagnosis not present

## 2015-02-14 LAB — BASIC METABOLIC PANEL
BUN: 18 mg/dL (ref 6–23)
CALCIUM: 9.3 mg/dL (ref 8.4–10.5)
CO2: 31 mEq/L (ref 19–32)
Chloride: 104 mEq/L (ref 96–112)
Creatinine, Ser: 1.02 mg/dL (ref 0.40–1.50)
GFR: 77.1 mL/min (ref >60.00)
GLUCOSE: 83 mg/dL (ref 70–99)
Potassium: 4 mEq/L (ref 3.5–5.1)
SODIUM: 141 meq/L (ref 135–145)

## 2015-02-14 MED ORDER — LOSARTAN POTASSIUM 100 MG PO TABS: 100.0000 mg | ORAL_TABLET | Freq: Every day | ORAL | Status: DC

## 2015-02-14 NOTE — Patient Instructions (Addendum)
Continue current dose of metoprolol.  Increase losartan from 50mg  to 100mg  once daily. Complete lab work prior to leaving, Follow up in 1 month as scheduled.

## 2015-02-14 NOTE — Assessment & Plan Note (Signed)
BP running high at home, will increase losartan from 50mg  to 100mg , continue current dose of metoprolol.

## 2015-02-14 NOTE — Progress Notes (Signed)
Subjective:    Patient ID: Gerald [name], male    DOB: 1946/03/04, 69 y.o.   MRN: [id number]  HPI  [name] is a 69 yr old male who presents today for follow up.   HTN- Meds were changed due to cost. Was previously on benicar and bystolic and was changed to losartan and metoprolol. Reports cost has gone from 300$/month to 4$/month and he is pleased. Reports home BP's have been running 160's/80-90's.  Reports feeling light headed over the weekend but this seems to be improving.  Denies CP/sob or swelling.   BP Readings from Last 3 Encounters:  02/14/15 140/80  09/18/14 134/80  03/15/14 126/68  Review of Systems See HPI  Past Medical History  Diagnosis Date  . Hypertension   . Hyperlipidemia   . 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 Alcohol abuse     quit 1989  . Hiatal hernia     EGD 3/10/3 stricture  . OSA (obstructive sleep apnea)     mild, per sleep study  . Onychomycosis   . Diverticulosis   . Hemorrhoids   . PVD (peripheral vascular disease) ([facility])   . Fatty liver   . Hx of adenomatous colonic polyps   . GERD (gastroesophageal reflux disease)   . Esophageal stricture   . Insomnia   . Back pain, chronic   . Sleep apnea     does not wear of c-pap  . Depression     Social History   Social History  . Marital Status: Married    Spouse Name: N/A  . Number of Children: 2  . Years of Education: N/A   Occupational History  . Retired    Social History Main Topics  . Smoking status: Former Smoker    Quit date: 08/29/2006  . Smokeless tobacco: Never Used  . Alcohol Use: No     Comment: quit alcohol 1989  . Drug Use: No  . Sexual Activity: Not on file   Other Topics Concern  . Not on file   Social History Narrative   Diet: regular, lots of vegetables-----exercise : none but active   Caffeine use:  5 cups coffee daily          Past Surgical History  Procedure Laterality Date  . Tonsillectomy and adenoidectomy      . Cystectomy      Right leg remotely, back 2012  . Mass removal      left shoulder  . Wisdom tooth extraction    . Colonoscopy    . Upper gastrointestinal endoscopy      Family History  Problem Relation Age of Onset  . Coronary artery disease Father   . Colon cancer Father   . Heart attack Paternal Grandfather   . Coronary artery disease Maternal Grandmother   . Colon cancer Maternal Grandmother   . Cancer Maternal Grandmother     colon  . Hypertension Maternal Aunt   . Prostate cancer Neg Hx   . Stroke Neg Hx   . Esophageal cancer Neg Hx   . Pancreatic cancer Neg Hx   . Rectal cancer Neg Hx   . Stomach cancer Neg Hx   . Melanoma Mother   . Coronary artery disease Paternal Grandmother   . Heart attack Maternal Grandfather     Allergies  Allergen Reactions  . Penicillins     Pt unsure of reaction    Current Outpatient Prescriptions on File  Prior to Visit  Medication Sig Dispense Refill  . aspirin 81 MG tablet Take 81 mg by mouth every other day.    . Calcium Carbonate-Vit D-Min (CALCIUM 1200 PO) Take 1 tablet by mouth daily.    Marland Kitchen ketoconazole (NIZORAL) 2 % shampoo Apply topically as needed. For eczema    . losartan (COZAAR) 50 MG tablet Take 1 tablet (50 mg total) by mouth daily. 30 tablet 2  . MAGNESIUM PO Take 1,250 mg by mouth once a week.     . Methylcellulose, Laxative, (CITRUCEL PO) Take 1 capsule by mouth daily.    . metoprolol tartrate (LOPRESSOR) 25 MG tablet Take 1 tablet (25 mg total) by mouth 2 (two) times daily. 60 tablet 2  . Misc Natural Products (MENS PROSTATE HEALTH FORMULA PO) Take 1 tablet by mouth 3 (three) times daily.    . Multiple Vitamins-Minerals (MULTIVITAMIN ADULTS 50+ PO) Take 1 tablet by mouth daily.    . Omega-3 Fatty Acids (FISH OIL) 1200 MG CAPS Take 1 capsule by mouth daily.    Marland Kitchen omeprazole (PRILOSEC) 20 MG capsule Take 1 capsule (20 mg total) by mouth daily. 30 capsule 3  . tamsulosin (FLOMAX) 0.4 MG CAPS capsule Take 0.8 mg by mouth  daily.      No current facility-administered medications on file prior to visit.    BP 140/80 mmHg  Pulse 59  Temp(Src) 98.4 F (36.9 C) (Oral)  Resp 16  Ht 5\' 7"  (1.702 m)  Wt 209 lb 9.6 oz (95.074 kg)  BMI 32.82 kg/m2  SpO2 97%       Objective:   Physical Exam  Constitutional: He is oriented to person, place, and time. He appears well-developed and well-nourished. No distress.  HENT:  Head: Normocephalic and atraumatic.  Right Ear: Tympanic membrane and ear canal normal.  Left Ear: Tympanic membrane and ear canal normal.  Mouth/Throat: No oropharyngeal exudate, posterior oropharyngeal edema or posterior oropharyngeal erythema.  Cardiovascular: Normal rate and regular rhythm.   No murmur heard. Pulmonary/Chest: Effort normal and breath sounds normal. No respiratory distress. He has no wheezes. He has no rales.  Musculoskeletal: He exhibits no edema.  Neurological: He is alert and oriented to person, place, and time.  Skin: Skin is warm and dry.  Psychiatric: He has a normal mood and affect. His behavior is normal. Thought content normal.          Assessment & Plan:

## 2015-02-14 NOTE — Progress Notes (Signed)
Pre visit review using our clinic review tool, if applicable. No additional management support is needed unless otherwise documented below in the visit note. 

## 2015-02-15 ENCOUNTER — Encounter: Payer: Self-pay | Admitting: Family

## 2015-03-19 ENCOUNTER — Ambulatory Visit (INDEPENDENT_AMBULATORY_CARE_PROVIDER_SITE_OTHER): Payer: Medicare Other | Admitting: Family

## 2015-03-19 ENCOUNTER — Encounter: Payer: Self-pay | Admitting: Family

## 2015-03-19 ENCOUNTER — Telehealth: Payer: Self-pay | Admitting: Family

## 2015-03-19 VITALS — BP 124/72 | HR 60 | Temp 97.4°F | Resp 18 | Ht 65.0 in | Wt 207.8 lb

## 2015-03-19 DIAGNOSIS — E785 Hyperlipidemia, unspecified: Secondary | ICD-10-CM

## 2015-03-19 DIAGNOSIS — R9431 Abnormal electrocardiogram [ECG] [EKG]: Secondary | ICD-10-CM

## 2015-03-19 DIAGNOSIS — I10 Essential (primary) hypertension: Secondary | ICD-10-CM

## 2015-03-19 DIAGNOSIS — Z1159 Encounter for screening for other viral diseases: Secondary | ICD-10-CM | POA: Diagnosis not present

## 2015-03-19 DIAGNOSIS — Z0000 Encounter for general adult medical examination without abnormal findings: Secondary | ICD-10-CM

## 2015-03-19 LAB — LIPID PANEL
CHOL/HDL RATIO: 5
Cholesterol: 193 mg/dL (ref 0–200)
HDL: 35.9 mg/dL — ABNORMAL LOW (ref >39.00)
LDL CALC: 119 mg/dL — AB (ref 0–99)
NONHDL: 156.77
Triglycerides: 189 mg/dL — ABNORMAL HIGH (ref 0.0–149.0)
VLDL: 37.8 mg/dL (ref 0.0–40.0)

## 2015-03-19 LAB — HEPATITIS C ANTIBODY: HCV Ab: NEGATIVE

## 2015-03-19 NOTE — Telephone Encounter (Signed)
Left message for pt to return my call.

## 2015-03-19 NOTE — Telephone Encounter (Signed)
Please let pt know that I reviewed his EKG.  EKG has some changes that could be age related, but I would like for him to meet with cardiology for further evaluation.

## 2015-03-19 NOTE — Progress Notes (Signed)
Pre visit review using our clinic review tool, if applicable. No additional management support is needed unless otherwise documented below in the visit note. 

## 2015-03-19 NOTE — Progress Notes (Addendum)
Subjective:    Gerald [name] is a 69 y.o. male who presents for Medicare Annual/Subsequent preventive examination.   Preventive Screening-Counseling & Management  Tobacco History  Smoking status  . Former Smoker  . Quit date: 08/29/2006  Smokeless tobacco  . Never Used    Problems Prior to Visit 1. GERD- maintained on prilosec. Reports no reflux symptoms.   2.  HTN- currently maintained on losartan, metoprolol.   BP Readings from Last 3 Encounters:  03/19/15 124/72  02/14/15 140/80  09/18/14 134/80   3.  Elevated PSA- hx of neg prostate biopsy 2009- follows with urology. Reports that they continue surveillance.   Immunizations: declines flu shot, other immunizations up to date Colonoscopy: 2008 Dental: up to date Vision:  This year  Current Problems (verified) Patient Active Problem List   Diagnosis Date Noted  . HTN (hypertension) 09/01/2011  . Elevated PSA 05/07/2011  . GERD (gastroesophageal reflux disease) 01/14/2011  . Family history of colon cancer 01/14/2011  . History of colon polyps 01/14/2011  . Fatty liver disease, nonalcoholic [id number]  . ESOPHAGEAL STRICTURE 11/27/2008  . COLONIC POLYPS, ADENOMATOUS, HX OF 11/27/2008  . ALCOHOL ABUSE, HX OF 09/16/2007  . DIVERTICULOSIS, COLON 07/16/2007  . BACK PAIN, CHRONIC 07/16/2007  . Hyperlipidemia 08/10/2006    Medications Prior to Visit Current Outpatient Prescriptions on File Prior to Visit  Medication Sig Dispense Refill  . aspirin 81 MG tablet Take 81 mg by mouth every other day.    . Calcium Carbonate-Vit D-Min (CALCIUM 1200 PO) Take 1 tablet by mouth daily.    Marland Kitchen ketoconazole (NIZORAL) 2 % shampoo Apply topically as needed. For eczema    . losartan (COZAAR) 100 MG tablet Take 1 tablet (100 mg total) by mouth daily. 90 tablet 3  . MAGNESIUM PO Take 1,250 mg by mouth once a week.     . Methylcellulose, Laxative, (CITRUCEL PO) Take 1 capsule by mouth daily.    . metoprolol tartrate (LOPRESSOR) 25 MG  tablet Take 1 tablet (25 mg total) by mouth 2 (two) times daily. 60 tablet 2  . Misc Natural Products (MENS PROSTATE HEALTH FORMULA PO) Take 1 tablet by mouth 3 (three) times daily.    . Multiple Vitamins-Minerals (MULTIVITAMIN ADULTS 50+ PO) Take 1 tablet by mouth daily.    . Omega-3 Fatty Acids (FISH OIL) 1200 MG CAPS Take 1 capsule by mouth daily.    Marland Kitchen omeprazole (PRILOSEC) 20 MG capsule Take 1 capsule (20 mg total) by mouth daily. 30 capsule 3  . tamsulosin (FLOMAX) 0.4 MG CAPS capsule Take 0.8 mg by mouth daily.      No current facility-administered medications on file prior to visit.    Current Medications (verified) Current Outpatient Prescriptions  Medication Sig Dispense Refill  . aspirin 81 MG tablet Take 81 mg by mouth every other day.    . Calcium Carbonate-Vit D-Min (CALCIUM 1200 PO) Take 1 tablet by mouth daily.    Marland Kitchen ketoconazole (NIZORAL) 2 % shampoo Apply topically as needed. For eczema    . losartan (COZAAR) 100 MG tablet Take 1 tablet (100 mg total) by mouth daily. 90 tablet 3  . MAGNESIUM PO Take 1,250 mg by mouth once a week.     . Methylcellulose, Laxative, (CITRUCEL PO) Take 1 capsule by mouth daily.    . metoprolol tartrate (LOPRESSOR) 25 MG tablet Take 1 tablet (25 mg total) by mouth 2 (two) times daily. 60 tablet 2  . Misc Natural Products ([place]  PO) Take 1 tablet by mouth 3 (three) times daily.    . Multiple Vitamins-Minerals (MULTIVITAMIN ADULTS 50+ PO) Take 1 tablet by mouth daily.    . Omega-3 Fatty Acids (FISH OIL) 1200 MG CAPS Take 1 capsule by mouth daily.    Marland Kitchen omeprazole (PRILOSEC) 20 MG capsule Take 1 capsule (20 mg total) by mouth daily. 30 capsule 3  . tamsulosin (FLOMAX) 0.4 MG CAPS capsule Take 0.8 mg by mouth daily.      No current facility-administered medications for this visit.     Allergies (verified) Penicillins   PAST HISTORY  Family History Family History  Problem Relation Age of Onset  . Coronary artery disease  Father   . Colon cancer Father   . Heart attack Paternal Grandfather   . Coronary artery disease Maternal Grandmother   . Colon cancer Maternal Grandmother   . Cancer Maternal Grandmother     colon  . Hypertension Maternal Aunt   . Prostate cancer Neg Hx   . Stroke Neg Hx   . Esophageal cancer Neg Hx   . Pancreatic cancer Neg Hx   . Rectal cancer Neg Hx   . Stomach cancer Neg Hx   . Melanoma Mother   . Coronary artery disease Paternal Grandmother   . Heart attack Maternal Grandfather     Social History Social History  Substance Use Topics  . Smoking status: Former Smoker    Quit date: 08/29/2006  . Smokeless tobacco: Never Used  . Alcohol Use: No     Comment: quit alcohol 1989    Are there smokers in your home (other than you)?  No  Risk Factors Current exercise habits: not exercising regularly  Dietary issues discussed: reports good diet   Cardiac risk factors: advanced age (older than 66 for men, 2 for women), hypertension, male gender and sedentary lifestyle.  Depression Screen (Note: if answer to either of the following is "Yes", a more complete depression screening is indicated)   Q1: Over the past two weeks, have you felt down, depressed or hopeless? Yes  Q2: Over the past two weeks, have you felt little interest or pleasure in doing things? No  Have you lost interest or pleasure in daily life? No  Do you often feel hopeless? No  Do you cry easily over simple problems? No  Activities of Daily Living In your present state of health, do you have any difficulty performing the following activities?:  Driving? No Managing money?  No Feeding yourself? No Getting from bed to chair? No Climbing a flight of stairs? No Preparing food and eating?: No Bathing or showering? No Getting dressed: No Getting to the toilet? No Using the toilet:No Moving around from place to place: No In the past year have you fallen or had a near fall?:No   Are you sexually active?   Yes  Do you have more than one partner?  No  Hearing Difficulties: No Do you often ask people to speak up or repeat themselves? No Do you experience ringing or noises in your ears? sometimes Do you have difficulty understanding soft or whispered voices? No   Do you feel that you have a problem with memory? No  Do you often misplace items? No  Do you feel safe at home?  Yes  Cognitive Testing  Alert? Yes  Normal Appearance?Yes  Oriented to person? Yes  Place? Yes   Time? Yes  Recall of three objects?  Yes  Can perform simple calculations? Yes  Displays appropriate judgment?Yes  Can read the correct time from a watch face?Yes   Advanced Directives have been discussed with the patient? Yes   List the Names of Other Physician/Practitioners you currently use: 1.    Indicate any recent Medical Services you may have received from other than [facility] providers in the past year (date may be approximate).  Immunization History  Administered Date(s) Administered  . Influenza Split 12/30/2010, 01/06/2012  . Influenza Whole 02/18/2007, 03/13/2009, 01/31/2010  . Influenza-Unspecified 01/29/2013, 12/29/2013  . Pneumococcal Conjugate-13 03/15/2014  . Pneumococcal Polysaccharide-23 05/29/2005, 01/06/2012  . Td 04/20/2001, 03/15/2014  . Zoster 08/27/2011    Screening Tests Health Maintenance  Topic Date Due  . INFLUENZA VACCINE  05/17/2015 (Originally 10/30/2014)  . COLONOSCOPY  02/16/2024  . TETANUS/TDAP  03/15/2024  . ZOSTAVAX  Addressed  . Hepatitis C Screening  Completed  . PNA vac Low Risk Adult  Completed    All answers were reviewed with the patient and necessary referrals were made:  O'SULLIVAN,Ila Landowski S., NP   03/19/2015   History reviewed: allergies, current medications, past family history, past medical history, past social history, past surgical history and problem list  Review of Systems Pertinent items are noted in HPI.    Objective:   Blood pressure 124/72, pulse  60, temperature 97.4 F (36.3 C), temperature source Oral, resp. rate 18, height 5\' 5"  (1.651 m), weight 207 lb 12.8 oz (94.257 kg), SpO2 97 %. Body mass index is 34.58 kg/(m^2).  Physical Exam  Constitutional: He is oriented to person, place, and time. He appears well-developed and well-nourished. No distress.  HENT:  Head: Normocephalic and atraumatic.  Right Ear: Tympanic membrane and ear canal normal.  Left Ear: Tympanic membrane and ear canal normal.  Mouth/Throat: Oropharynx is clear and moist.  Eyes: Pupils are equal, round, and reactive to light. No scleral icterus.  Neck: Normal range of motion. No thyromegaly present.  Cardiovascular: Normal rate and regular rhythm.   No murmur heard. Pulmonary/Chest: Effort normal and breath sounds normal. No respiratory distress. He has no wheezes. He has no rales. He exhibits no tenderness.  Abdominal: Soft. Bowel sounds are normal. He exhibits no distension and no mass. There is no tenderness. There is no rebound and no guarding.  Musculoskeletal: He exhibits no edema.  Lymphadenopathy:    He has no cervical adenopathy.  Neurological: He is alert and oriented to person, place, and time. He has normal patellar reflexes. He exhibits normal muscle tone. Coordination normal.  Skin: Skin is warm and dry.  Psychiatric: He has a normal mood and affect. His behavior is normal. Judgment and thought content normal.          Assessment & Plan:        Assessment:      Plan:     During the course of the visit the patient was educated and counseled about appropriate screening and preventive services including:    preventative care screenings up to date  Diet review for nutrition referral? Yes ____  Not Indicated __x__   Patient Instructions (the written plan) was given to the patient.  Medicare Attestation I have personally reviewed: The patient's medical and social history Their use of alcohol, tobacco or illicit drugs Their  current medications and supplements The patient's functional ability including ADLs,fall risks, home safety risks, cognitive, and hearing and visual impairment Diet and physical activities Evidence for depression or mood disorders  The patient's weight, height, BMI, and visual acuity have been recorded in  the chart.  I have made referrals, counseling, and provided education to the patient based on review of the above and I have provided the patient with a written personalized care plan for preventive services.     O'SULLIVAN,Saniah Schroeter S., NP   03/19/2015      Abnormal EKG-  ? New LAFB- asymptomatic, will refer to cardiology for additional evaluation given age and other medical problems. See phone note 12/19.

## 2015-03-19 NOTE — Patient Instructions (Signed)
Please complete lab work prior to leaving.   

## 2015-03-20 NOTE — Telephone Encounter (Signed)
Notified pt and he is agreeable to proceed with referral. 

## 2015-04-03 ENCOUNTER — Encounter: Payer: Self-pay | Admitting: Cardiovascular Disease

## 2015-04-03 ENCOUNTER — Ambulatory Visit (INDEPENDENT_AMBULATORY_CARE_PROVIDER_SITE_OTHER): Payer: Medicare Other | Admitting: Cardiovascular Disease

## 2015-04-03 VITALS — BP 134/88 | HR 57 | Ht 67.0 in | Wt 208.0 lb

## 2015-04-03 DIAGNOSIS — K2100 Gastro-esophageal reflux disease with esophagitis, without bleeding: Secondary | ICD-10-CM

## 2015-04-03 DIAGNOSIS — I10 Essential (primary) hypertension: Secondary | ICD-10-CM

## 2015-04-03 DIAGNOSIS — I444 Left anterior fascicular block: Secondary | ICD-10-CM

## 2015-04-03 DIAGNOSIS — Z720 Tobacco use: Secondary | ICD-10-CM

## 2015-04-03 DIAGNOSIS — R9431 Abnormal electrocardiogram [ECG] [EKG]: Secondary | ICD-10-CM

## 2015-04-03 NOTE — Patient Instructions (Signed)
Your physician has requested that you have en exercise stress myoview. For further information please visit [email or web address]. Please follow instruction sheet, as given.   Your physician has requested that you have an echocardiogram. Echocardiography is a painless test that uses sound waves to create images of your heart. It provides your doctor with information about the size and shape of your heart and how well your heart's chambers and valves are working. This procedure takes approximately one hour. There are no restrictions for this procedure.   Your physician recommends that you schedule a follow-up appointment in: 8 weeks with Dr Claiborne Billings

## 2015-04-07 ENCOUNTER — Encounter: Payer: Self-pay | Admitting: Cardiovascular Disease

## 2015-04-07 DIAGNOSIS — I444 Left anterior fascicular block: Secondary | ICD-10-CM

## 2015-04-07 HISTORY — DX: Left anterior fascicular block: I44.4

## 2015-04-07 NOTE — Progress Notes (Signed)
Patient ID: ELIECER MANCINE, male   DOB: 1945/12/04, 70 y.o.   MRN: [id number]     Primary: Metro Kung, NP  PATIENT PROFILE: DEVANTE PENNELLA is a 70 y.o. male  Who is referred through the courtesy of Sandford Craze for evaluation of an abnormal ECG.  HPI:  OMARIAN SANDLER denies any history of coronary artery disease.   He has a history of hypertension since 2013 , GERD, and diverticular disease.  He has a remote history of tobacco use had smoked  1-2  Packs per day  Proximally 32 years.  He quit smoking in 2000. He has a history of mild obesity.  There also is a history of snoring, but he states he had a negative sleep study approximately 8 years ago.  He denies any episodes of chest pain but does admit to shortness of breath particularly with walking up steps and notes occasional leg cramps.  He had recently seen Sandford Craze on 03/19/2015.  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  He was not complaining of any chest pain or dyspnea.  He is now referred for evaluation.  Past Medical History  Diagnosis Date  . Hypertension   . Hyperlipidemia   . 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 Alcohol abuse     quit 1989  . Hiatal hernia     EGD 3/10/3 stricture  . OSA (obstructive sleep apnea)     mild, per sleep study  . Onychomycosis   . Diverticulosis   . Hemorrhoids   . PVD (peripheral vascular disease) (HCC)   . Fatty liver   . Hx of adenomatous colonic polyps   . GERD (gastroesophageal reflux disease)   . Esophageal stricture   . Insomnia   . Back pain, chronic   . Sleep apnea     does not wear of c-pap  . Depression     Past Surgical History  Procedure Laterality Date  . Tonsillectomy and adenoidectomy    . Cystectomy      Right leg remotely, back 2012  . Mass removal      left shoulder  . Wisdom tooth extraction    .  Colonoscopy    . Upper gastrointestinal endoscopy      Allergies  Allergen Reactions  . Penicillins     Pt unsure of reaction    Current Outpatient Prescriptions  Medication Sig Dispense Refill  . aspirin 81 MG tablet Take 81 mg by mouth every other day.    . Calcium Carbonate-Vit D-Min (CALCIUM 1200 PO) Take 1 tablet by mouth daily.    Marland Kitchen ketoconazole (NIZORAL) 2 % shampoo Apply topically as needed. For eczema    . losartan (COZAAR) 100 MG tablet Take 1 tablet (100 mg total) by mouth daily. 90 tablet 3  . MAGNESIUM PO Take 1,250 mg by mouth once a week.     . metoprolol tartrate (LOPRESSOR) 25 MG tablet Take 1 tablet (25 mg total) by mouth 2 (two) times daily. 60 tablet 2  . Misc Natural Products (MENS PROSTATE HEALTH FORMULA PO) Take 1 tablet by mouth 3 (three) times daily.    . Multiple Vitamins-Minerals (MULTIVITAMIN ADULTS 50+ PO) Take 1 tablet by mouth daily.    . Omega-3 Fatty Acids (FISH OIL) 1200 MG CAPS Take 1 capsule by mouth daily.    Marland Kitchen  omeprazole (PRILOSEC) 20 MG capsule Take 1 capsule (20 mg total) by mouth daily. 30 capsule 3  . tamsulosin (FLOMAX) 0.4 MG CAPS capsule Take 0.4 mg by mouth 2 (two) times daily.     No current facility-administered medications for this visit.    Social History   Social History  . Marital Status: Married    Spouse Name: N/A  . Number of Children: 2  . Years of Education: N/A   Occupational History  . Retired    Social History Main Topics  . Smoking status: Former Smoker    Quit date: 08/29/2006  . Smokeless tobacco: Never Used  . Alcohol Use: No     Comment: quit alcohol 1989  . Drug Use: No  . Sexual Activity: Not on file   Other Topics Concern  . Not on file   Social History Narrative   Diet: regular, lots of vegetables-----exercise : none but active   Caffeine use:  5 cups coffee daily         Additional social history is notable that he is originally from [place].  He is retired in the [employer], materials.  Family History  Problem Relation Age of Onset  . Coronary artery disease Father   . Colon cancer Father   . Heart attack Paternal Grandfather   . Coronary artery disease Maternal Grandmother   . Colon cancer Maternal Grandmother   . Cancer Maternal Grandmother     colon  . Hypertension Maternal Aunt   . Prostate cancer Neg Hx   . Stroke Neg Hx   . Esophageal cancer Neg Hx   . Pancreatic cancer Neg Hx   . Rectal cancer Neg Hx   . Stomach cancer Neg Hx   . Melanoma Mother   . Coronary artery disease Paternal Grandmother   . Heart attack Maternal Grandfather    Family history is notable in that his mother died at age 46 as result of melanoma.  Father died at age 58 with a blood clot. Several grandparents had myocardial infarctions.  He has 2 sons, ages 1 and 63.  ROS General: Negative; No fevers, chills, or night sweats HEENT: Negative; No changes in vision or hearing, sinus congestion, difficulty swallowing Pulmonary: Negative; No cough, wheezing, shortness of breath, hemoptysis Cardiovascular:  See HPI; No chest pain, presyncope, syncope, palpitations, edema GI:  Positive for GERD. GU:  History of elevated PSA with negative biopsy in 2009. Musculoskeletal: Negative; no myalgias, joint pain, or weakness Hematologic/Oncologic: Negative; no easy bruising, bleeding Endocrine: Negative; no heat/cold intolerance; no diabetes Neuro: Negative; no changes in balance, headaches Skin: Negative; No rashes or skin lesions Psychiatric: Negative; No behavioral problems, depression Sleep:  Positive for snoring; No daytime sleepiness, hypersomnolence, bruxism, restless legs, hypnogagnic hallucinations Other comprehensive 14 point system review is negative   Physical Exam BP 134/88 mmHg  Pulse 57  Ht 5\' 7"  (1.702 m)  Wt 208 lb (94.348 kg)  BMI 32.57 kg/m2  Wt Readings from Last 3 Encounters:  04/03/15 208 lb (94.348 kg)  03/19/15 207 lb 12.8 oz (94.257 kg)  02/14/15 209 lb  9.6 oz (95.074 kg)   General: Alert, oriented, no distress.  Skin: normal turgor, no rashes, warm and dry HEENT: Normocephalic, atraumatic. Pupils equal round and reactive to light; sclera anicteric; extraocular muscles intact; Fundi  Without hemorrhages or exudates Nose without nasal septal hypertrophy Mouth/Parynx benign; Mallinpatti scale 3 Neck: No JVD, no carotid bruits; normal carotid upstroke Lungs: clear to ausculatation  and percussion; no wheezing or rales Chest wall: without tenderness to palpitation Heart: PMI not displaced, RRR, s1 s2 normal, 1/6 systolic murmur, no diastolic murmur, no rubs, gallops, thrills, or heaves Abdomen: soft, nontender; no hepatosplenomehaly, BS+; abdominal aorta nontender and not dilated by palpation. Back: no CVA tenderness Pulses 2+ Musculoskeletal: full range of motion, normal strength, no joint deformities Extremities: no clubbing cyanosis or edema, Homan's sign negative  Neurologic: grossly nonfocal; Cranial nerves grossly wnl Psychologic: Normal mood and affect   ECG (independently read by me):  Sinus bradycardia at 57 bpm.  Left axis deviation.  Nonspecific T-wave changes.  This ECG was compared to the one done on December 19.  The present ECG shows resolution of prior left anterior hemiblock and no longer has QS complex V1 V2.  LABS:  BMP Latest Ref Rng 02/14/2015 09/18/2014 03/15/2014  Glucose 70 - 99 mg/dL 83 89 89  BUN 6 - 23 mg/dL 18 20 17   Creatinine 0.40 - 1.50 mg/dL 8.29 5.62 1.0  Sodium 130 - 145 mEq/L 141 138 137  Potassium 3.5 - 5.1 mEq/L 4.0 4.5 4.0  Chloride 96 - 112 mEq/L 104 103 106  CO2 19 - 32 mEq/L 31 32 28  Calcium 8.4 - 10.5 mg/dL 9.3 9.6 9.0     Hepatic Function Latest Ref Rng 03/15/2014 11/29/2013 09/01/2011  Total Protein 6.0 - 8.3 g/dL 7.1 7.1 6.9  Albumin 3.5 - 5.2 g/dL 4.0 4.1 4.2  AST 0 - 37 U/L 26 37 24  ALT 0 - 53 U/L 41 41 35  Alk Phosphatase 39 - 117 U/L 74 69 85  Total Bilirubin 0.2 - 1.2 mg/dL 0.7 0.8  0.5  Bilirubin, Direct 0.0 - 0.3 mg/dL 0.0 - 0.1    CBC Latest Ref Rng 11/29/2013 08/29/2010 09/16/2007  WBC 4.0 - 10.5 K/uL 7.8 6.7 5.9  Hemoglobin 13.0 - 17.0 g/dL 86.5 78.4 69.6  Hematocrit 39.0 - 52.0 % 44.1 45.0 42.5  Platelets 150.0 - 400.0 K/uL 143.0(L) 158.0 152   Lab Results  Component Value Date   MCV 95.3 11/29/2013   MCV 94.8 08/29/2010   MCV 94.1 09/16/2007   Lab Results  Component Value Date   TSH 2.42 08/29/2010   Lab Results  Component Value Date   HGBA1C 5.6 09/16/2007     BNP No results found for: BNP  ProBNP No results found for: PROBNP   Lipid Panel     Component Value Date/Time   CHOL 193 03/19/2015 0852   TRIG 189.0* 03/19/2015 0852   HDL 35.90* 03/19/2015 0852   CHOLHDL 5 03/19/2015 0852   VLDL 37.8 03/19/2015 0852   LDLCALC 119* 03/19/2015 0852   LDLDIRECT 140.0 08/29/2010 1043    RADIOLOGY: No results found.   ASSESSMENT AND PLAN:  Mr. Shubh Reynaud is a 70 year old white male who has a significant history of prior tobacco use for 32 years up to 2 packs per day, Pred Forte really quit smoking 17 years ago.  He has a distal cardiac risk factors notable for hypertension as well as family history for coronary artery disease in grandparents.  He denies any symptoms suggestive of chest pain worrisome for angina.  He does note some mild redness of breath with climbing steps.  His blood pressure today is stable on his current dose of losartan 100 mg and metoprolol, tartrate 25 mg twice a day.  He has been taking Prilosec for GERD with control.  With his cardiac risk factor profile, his history of hypertension,  I have recommended that an echo Doppler study be obtained to evaluate both systolic and diastolic function as well as valvular architecture.  I will also schedule him for an exercise nuclear perfusion study to make certain his ECG abnormalities are not do to silent ischemia/infarction.  I reviewed his recent laboratory.  His lipid study is  suggestive of an atherogenic dyslipidemic pattern and it may be worthwhile to consider NMR lipoprotein profile for more aggressive evaluation.  I will see him back in follow-up of the above studies  And further recommendations will be made at that time.   Lennette Bihari, MD, [facility] 04/07/2015 3:22 PM

## 2015-04-12 ENCOUNTER — Telehealth (HOSPITAL_COMMUNITY): Payer: Self-pay

## 2015-04-12 NOTE — Telephone Encounter (Signed)
Encounter complete. 

## 2015-04-16 ENCOUNTER — Ambulatory Visit (HOSPITAL_COMMUNITY): Payer: Medicare Other | Attending: Cardiovascular Disease

## 2015-04-16 ENCOUNTER — Other Ambulatory Visit: Payer: Self-pay

## 2015-04-16 DIAGNOSIS — I351 Nonrheumatic aortic (valve) insufficiency: Secondary | ICD-10-CM | POA: Diagnosis not present

## 2015-04-16 DIAGNOSIS — I10 Essential (primary) hypertension: Secondary | ICD-10-CM | POA: Insufficient documentation

## 2015-04-16 DIAGNOSIS — Z720 Tobacco use: Secondary | ICD-10-CM

## 2015-04-16 DIAGNOSIS — Z87891 Personal history of nicotine dependence: Secondary | ICD-10-CM | POA: Insufficient documentation

## 2015-04-16 DIAGNOSIS — E785 Hyperlipidemia, unspecified: Secondary | ICD-10-CM | POA: Insufficient documentation

## 2015-04-16 DIAGNOSIS — R9431 Abnormal electrocardiogram [ECG] [EKG]: Secondary | ICD-10-CM | POA: Diagnosis not present

## 2015-04-16 DIAGNOSIS — I517 Cardiomegaly: Secondary | ICD-10-CM | POA: Insufficient documentation

## 2015-04-16 HISTORY — PX: TRANSTHORACIC ECHOCARDIOGRAM: SHX275

## 2015-04-17 ENCOUNTER — Ambulatory Visit (HOSPITAL_COMMUNITY)
Admission: RE | Admit: 2015-04-17 | Discharge: 2015-04-17 | Disposition: A | Discharge status: Home / Self Care | Payer: Medicare Other | Source: Ambulatory Visit | Attending: Cardiovascular Disease | Admitting: Cardiovascular Disease

## 2015-04-17 DIAGNOSIS — F17200 Nicotine dependence, unspecified, uncomplicated: Secondary | ICD-10-CM | POA: Diagnosis not present

## 2015-04-17 DIAGNOSIS — E669 Obesity, unspecified: Secondary | ICD-10-CM | POA: Insufficient documentation

## 2015-04-17 DIAGNOSIS — Z8249 Family history of ischemic heart disease and other diseases of the circulatory system: Secondary | ICD-10-CM | POA: Diagnosis not present

## 2015-04-17 DIAGNOSIS — G4733 Obstructive sleep apnea (adult) (pediatric): Secondary | ICD-10-CM | POA: Insufficient documentation

## 2015-04-17 DIAGNOSIS — R9431 Abnormal electrocardiogram [ECG] [EKG]: Secondary | ICD-10-CM | POA: Diagnosis not present

## 2015-04-17 DIAGNOSIS — Z6832 Body mass index (BMI) 32.0-32.9, adult: Secondary | ICD-10-CM | POA: Insufficient documentation

## 2015-04-17 DIAGNOSIS — Z87891 Personal history of nicotine dependence: Secondary | ICD-10-CM | POA: Insufficient documentation

## 2015-04-17 DIAGNOSIS — Z720 Tobacco use: Secondary | ICD-10-CM | POA: Diagnosis not present

## 2015-04-17 DIAGNOSIS — I10 Essential (primary) hypertension: Secondary | ICD-10-CM | POA: Insufficient documentation

## 2015-04-17 DIAGNOSIS — I739 Peripheral vascular disease, unspecified: Secondary | ICD-10-CM | POA: Insufficient documentation

## 2015-04-17 DIAGNOSIS — R0609 Other forms of dyspnea: Secondary | ICD-10-CM | POA: Diagnosis not present

## 2015-04-17 LAB — MYOCARDIAL PERFUSION IMAGING
CHL CUP NUCLEAR SDS: 3
CHL CUP NUCLEAR SRS: 3
CHL CUP RESTING HR STRESS: 55 {beats}/min
LV sys vol: 44 mL
LVDIAVOL: 99 mL
Peak HR: 84 {beats}/min
SSS: 4

## 2015-04-17 MED ORDER — TECHNETIUM TC 99M SESTAMIBI GENERIC - CARDIOLITE
10.7000 | Freq: Once | INTRAVENOUS | Status: AC | PRN
Start: 2015-04-17 — End: 2015-04-17
  Administered 2015-04-17: 10.7 via INTRAVENOUS

## 2015-04-17 MED ORDER — TECHNETIUM TC 99M SESTAMIBI GENERIC - CARDIOLITE
31.4000 | Freq: Once | INTRAVENOUS | Status: AC | PRN
  Administered 2015-04-17: 31.4 via INTRAVENOUS

## 2015-04-17 MED ORDER — REGADENOSON 0.4 MG/5ML IV SOLN
0.4000 mg | Freq: Once | INTRAVENOUS | Status: AC
  Administered 2015-04-17: 0.4 mg via INTRAVENOUS

## 2015-05-07 ENCOUNTER — Other Ambulatory Visit: Payer: Self-pay | Admitting: Family

## 2015-05-29 ENCOUNTER — Ambulatory Visit (INDEPENDENT_AMBULATORY_CARE_PROVIDER_SITE_OTHER): Payer: Medicare Other | Admitting: Family

## 2015-05-29 ENCOUNTER — Encounter: Payer: Self-pay | Admitting: Family

## 2015-05-29 ENCOUNTER — Telehealth: Payer: Self-pay | Admitting: *Deleted

## 2015-05-29 VITALS — BP 130/70 | HR 65 | Temp 98.5°F | Resp 16 | Ht 65.0 in | Wt 206.6 lb

## 2015-05-29 DIAGNOSIS — J209 Acute bronchitis, unspecified: Secondary | ICD-10-CM

## 2015-05-29 MED ORDER — GUAIFENESIN-CODEINE 100-10 MG/5ML PO SYRP: 5.0000 mL | ORAL_SOLUTION | Freq: Three times a day (TID) | ORAL | Status: DC | PRN

## 2015-05-29 MED ORDER — AZITHROMYCIN 250 MG PO TABS: ORAL_TABLET | ORAL | Status: DC

## 2015-05-29 NOTE — Progress Notes (Signed)
Pre visit review using our clinic review tool, if applicable. No additional management support is needed unless otherwise documented below in the visit note. 

## 2015-05-29 NOTE — Telephone Encounter (Signed)
Pt called c/o "runny nose, chest congestion, intermittent productive cough with green phlegm x 1 week". Denies fever / headache. Reports that he has tried Nyquil otc. Advised pt he would need evaluation in the office to determine if prescriptions would be needed. He scheduled appt for today with Melissa at 2pm.

## 2015-05-29 NOTE — Patient Instructions (Signed)
Start zpak for bronchitis. Start cheratussin as needed for cough. Call if symptoms worsen, if you develop fever, or if symptoms do not continue to improve.

## 2015-05-29 NOTE — Progress Notes (Signed)
Subjective:    Patient ID: Gerald [name], male    DOB: 1945-11-09, 70 y.o.   MRN: [id number]  HPI  Mr. Pagano is a 70 yr old male who presents today with chief complaint of cough. Reports symptoms began 1 week ago. Symptoms are associated with nasal congestion/chest congestion. Using Daquil/Nyquil.  Notes slight improvement. Mild sore throat initially. Coughing green mucous. Feels like overal symptoms are improving but very slowly.   Review of Systems  Constitutional: Negative for fever.  Neurological: Negative for headaches.   Past Medical History  Diagnosis Date  . Hypertension   . Hyperlipidemia   . 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 Alcohol abuse     quit 1989  . Hiatal hernia     EGD 3/10/3 stricture  . OSA (obstructive sleep apnea)     mild, per sleep study  . Onychomycosis   . Diverticulosis   . Hemorrhoids   . PVD (peripheral vascular disease) (HCC)   . Fatty liver   . Hx of adenomatous colonic polyps   . GERD (gastroesophageal reflux disease)   . Esophageal stricture   . Insomnia   . Back pain, chronic   . Sleep apnea     does not wear of c-pap  . Depression     Social History   Social History  . Marital Status: Married    Spouse Name: N/A  . Number of Children: 2  . Years of Education: N/A   Occupational History  . Retired    Social History Main Topics  . Smoking status: Former Smoker    Quit date: 08/29/2006  . Smokeless tobacco: Never Used  . Alcohol Use: No     Comment: quit alcohol 1989  . Drug Use: No  . Sexual Activity: Not on file   Other Topics Concern  . Not on file   Social History Narrative   Diet: regular, lots of vegetables-----exercise : none but active   Caffeine use:  5 cups coffee daily          Past Surgical History  Procedure Laterality Date  . Tonsillectomy and adenoidectomy    . Cystectomy      Right leg remotely, back 2012  . Mass removal      left shoulder  . Wisdom  tooth extraction    . Colonoscopy    . Upper gastrointestinal endoscopy      Family History  Problem Relation Age of Onset  . Coronary artery disease Father   . Colon cancer Father   . Heart attack Paternal Grandfather   . Coronary artery disease Maternal Grandmother   . Colon cancer Maternal Grandmother   . Cancer Maternal Grandmother     colon  . Hypertension Maternal Aunt   . Prostate cancer Neg Hx   . Stroke Neg Hx   . Esophageal cancer Neg Hx   . Pancreatic cancer Neg Hx   . Rectal cancer Neg Hx   . Stomach cancer Neg Hx   . Melanoma Mother   . Coronary artery disease Paternal Grandmother   . Heart attack Maternal Grandfather     Allergies  Allergen Reactions  . Penicillins     Pt unsure of reaction    Current Outpatient Prescriptions on File Prior to Visit  Medication Sig Dispense Refill  . aspirin 81 MG tablet Take 81 mg by mouth every other day.    . Calcium Carbonate-Vit D-Min (CALCIUM 1200  PO) Take 1 tablet by mouth daily.    Marland Kitchen ketoconazole (NIZORAL) 2 % shampoo Apply topically as needed. For eczema    . losartan (COZAAR) 100 MG tablet Take 1 tablet (100 mg total) by mouth daily. 90 tablet 3  . MAGNESIUM PO Take 1,250 mg by mouth once a week.     . metoprolol tartrate (LOPRESSOR) 25 MG tablet TAKE 1 TABLET BY MOUTH TWICE DAILY 60 tablet 5  . Misc Natural Products (MENS PROSTATE HEALTH FORMULA PO) Take 1 tablet by mouth 3 (three) times daily.    . Multiple Vitamins-Minerals (MULTIVITAMIN ADULTS 50+ PO) Take 1 tablet by mouth daily.    . Omega-3 Fatty Acids (FISH OIL) 1200 MG CAPS Take 1 capsule by mouth daily.    Marland Kitchen omeprazole (PRILOSEC) 20 MG capsule Take 1 capsule (20 mg total) by mouth daily. 30 capsule 3  . tamsulosin (FLOMAX) 0.4 MG CAPS capsule Take 0.4 mg by mouth 2 (two) times daily.     No current facility-administered medications on file prior to visit.    BP 130/70 mmHg  Pulse 65  Temp(Src) 98.5 F (36.9 C) (Oral)  Resp 16  Ht 5\' 5"  (1.651 m)   Wt 206 lb 9.6 oz (93.713 kg)  BMI 34.38 kg/m2  SpO2 96%       Objective:   Physical Exam  Constitutional: He is oriented to person, place, and time. He appears well-developed and well-nourished. No distress.  HENT:  Head: Normocephalic and atraumatic.  Right Ear: Tympanic membrane and ear canal normal.  Left Ear: Tympanic membrane and ear canal normal.  Mouth/Throat: No oropharyngeal exudate, posterior oropharyngeal edema or posterior oropharyngeal erythema.  Cardiovascular: Normal rate and regular rhythm.   No murmur heard. Pulmonary/Chest: Effort normal and breath sounds normal. No respiratory distress. He has no wheezes. He has no rales.  Musculoskeletal: He exhibits no edema.  Neurological: He is alert and oriented to person, place, and time.  Skin: Skin is warm and dry.  Psychiatric: He has a normal mood and affect. His behavior is normal. Thought content normal.          Assessment & Plan:  Bronchitis- will rx with zpak, and add prn cheratussin as needed for cough. Pt advised  Call if symptoms worsen, if you develop fever, or if symptoms do not continue to improve.

## 2015-06-05 ENCOUNTER — Encounter: Payer: Self-pay | Admitting: Cardiovascular Disease

## 2015-06-05 ENCOUNTER — Ambulatory Visit (INDEPENDENT_AMBULATORY_CARE_PROVIDER_SITE_OTHER): Payer: Medicare Other | Admitting: Cardiovascular Disease

## 2015-06-05 VITALS — BP 126/82 | HR 72 | Ht 66.0 in | Wt 207.1 lb

## 2015-06-05 DIAGNOSIS — I119 Hypertensive heart disease without heart failure: Secondary | ICD-10-CM | POA: Diagnosis not present

## 2015-06-05 DIAGNOSIS — I10 Essential (primary) hypertension: Secondary | ICD-10-CM | POA: Diagnosis not present

## 2015-06-05 DIAGNOSIS — E669 Obesity, unspecified: Secondary | ICD-10-CM

## 2015-06-05 DIAGNOSIS — E785 Hyperlipidemia, unspecified: Secondary | ICD-10-CM | POA: Diagnosis not present

## 2015-06-05 HISTORY — DX: Hypertensive heart disease without heart failure: I11.9

## 2015-06-05 NOTE — Patient Instructions (Signed)
Your physician recommends that you schedule a follow-up appointment as needed with Dr kelly.  

## 2015-06-05 NOTE — Progress Notes (Signed)
Patient ID: Gerald [name], male   DOB: 09/23/1945, 70 y.o.   MRN: [id number]     Primary: Metro Kung, NP  PATIENT PROFILE: Gerald [name] is a 70 y.o. male who I saw 2 months ago through the courtesy of Sand[facility] Craze for evaluation of an abnormal ECG.  He presents for follow-up evaluation.  HPI:  Gerald [name] denies any history of coronary artery disease.   He has a history of hypertension since 2013 , GERD, and diverticular disease.  He has a remote history of tobacco use had smoked  1-2  packs per day for 32 years.  He quit smoking in 2000. He has a history of mild obesity.  There also is a history of snoring, but he states he had a negative sleep study approximately 8 years ago.  He denies any episodes of chest pain but does admit to shortness of breath particularly with walking up steps and notes occasional leg cramps.  He had recently seen Sandford Craze on 03/19/2015.  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  He was not complaining of any chest pain or dyspnea  I saw for initial evaluation at which time he essentially was asymptomatic.  He underwent an echo Doppler study which showed an ejection fraction at 60-65% with moderate left ventricular hypertrophy and grade 1 diastolic dysfunction.  There was trivial aortic insufficiency.  A nuclear perfusion study was essentially normal, although probable mild diaphragmatic attenuation was present.  Post-rest ejection fraction was 56%.  Laboratory revealed total cholesterol 193, triglycerides 189, HDL 35.9, VLDL 37.8, and LDL 119.  He denies any chest pain.  He denies shortness of breath.  He presents for reevaluation.  Past Medical History  Diagnosis Date  . Hypertension   . Hyperlipidemia   . 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 Alcohol abuse     quit 1989  . Hiatal hernia     EGD 3/10/3  stricture  . OSA (obstructive sleep apnea)     mild, per sleep study  . Onychomycosis   . Diverticulosis   . Hemorrhoids   . PVD (peripheral vascular disease) (HCC)   . Fatty liver   . Hx of adenomatous colonic polyps   . GERD (gastroesophageal reflux disease)   . Esophageal stricture   . Insomnia   . Back pain, chronic   . Sleep apnea     does not wear of c-pap  . Depression     Past Surgical History  Procedure Laterality Date  . Tonsillectomy and adenoidectomy    . Cystectomy      Right leg remotely, back 2012  . Mass removal      left shoulder  . Wisdom tooth extraction    . Colonoscopy    . Upper gastrointestinal endoscopy      Allergies  Allergen Reactions  . Penicillins     Pt unsure of reaction    Current Outpatient Prescriptions  Medication Sig Dispense Refill  . aspirin 81 MG tablet Take 81 mg by mouth every other day.    . Calcium Carbonate-Vit D-Min (CALCIUM 1200 PO) Take 1 tablet by mouth daily.    Marland Kitchen guaiFENesin-codeine (CHERATUSSIN AC) 100-10 MG/5ML syrup Take 5 mLs by mouth 3 (three) times daily as needed for cough. 120 mL 0  . ketoconazole (NIZORAL) 2 % shampoo Apply  topically as needed. For eczema    . losartan (COZAAR) 100 MG tablet Take 1 tablet (100 mg total) by mouth daily. 90 tablet 3  . MAGNESIUM PO Take 1,250 mg by mouth once a week.     . metoprolol tartrate (LOPRESSOR) 25 MG tablet TAKE 1 TABLET BY MOUTH TWICE DAILY 60 tablet 5  . Misc Natural Products (MENS PROSTATE HEALTH FORMULA PO) Take 1 tablet by mouth 3 (three) times daily.    . Multiple Vitamins-Minerals (MULTIVITAMIN ADULTS 50+ PO) Take 1 tablet by mouth daily.    . Omega-3 Fatty Acids (FISH OIL) 1200 MG CAPS Take 1 capsule by mouth daily.    Marland Kitchen omeprazole (PRILOSEC) 20 MG capsule Take 1 capsule (20 mg total) by mouth daily. 30 capsule 3  . tamsulosin (FLOMAX) 0.4 MG CAPS capsule Take 0.4 mg by mouth 2 (two) times daily.     No current facility-administered medications for this  visit.    Social History   Social History  . Marital Status: Married    Spouse Name: N/A  . Number of Children: 2  . Years of Education: N/A   Occupational History  . Retired    Social History Main Topics  . Smoking status: Former Smoker    Quit date: 08/29/2006  . Smokeless tobacco: Never Used  . Alcohol Use: No     Comment: quit alcohol 1989  . Drug Use: No  . Sexual Activity: Not on file   Other Topics Concern  . Not on file   Social History Narrative   Diet: regular, lots of vegetables-----exercise : none but active   Caffeine use:  5 cups coffee daily         Additional social history is notable that he is originally from [place].  He is retired in the [employer], broadcasting/film/video.  Family History  Problem Relation Age of Onset  . Coronary artery disease Father   . Colon cancer Father   . Heart attack Paternal Grandfather   . Coronary artery disease Maternal Grandmother   . Colon cancer Maternal Grandmother   . Cancer Maternal Grandmother     colon  . Hypertension Maternal Aunt   . Prostate cancer Neg Hx   . Stroke Neg Hx   . Esophageal cancer Neg Hx   . Pancreatic cancer Neg Hx   . Rectal cancer Neg Hx   . Stomach cancer Neg Hx   . Melanoma Mother   . Coronary artery disease Paternal Grandmother   . Heart attack Maternal Grandfather    Family history is notable in that his mother died at age 37 as result of melanoma.  Father died at age 14 with a blood clot. Several grandparents had myocardial infarctions.  He has 2 sons, ages 109 and 39.  ROS General: Negative; No fevers, chills, or night sweats HEENT: Negative; No changes in vision or hearing, sinus congestion, difficulty swallowing Pulmonary: Negative; No cough, wheezing, shortness of breath, hemoptysis Cardiovascular:  See HPI; No chest pain, presyncope, syncope, palpitations, edema GI:  Positive for GERD. GU:  History of elevated PSA with negative biopsy in 2009. Musculoskeletal:  Negative; no myalgias, joint pain, or weakness Hematologic/Oncologic: Negative; no easy bruising, bleeding Endocrine: Negative; no heat/cold intolerance; no diabetes Neuro: Negative; no changes in balance, headaches Skin: Negative; No rashes or skin lesions Psychiatric: Negative; No behavioral problems, depression Sleep:  Positive for snoring; No daytime sleepiness, hypersomnolence, bruxism, restless legs, hypnogagnic hallucinations Other comprehensive 14 point system review is  negative   Physical Exam BP 126/82 mmHg  Pulse 72  Ht 5\' 6"  (1.676 m)  Wt 207 lb 1.6 oz (93.94 kg)  BMI 33.44 kg/m2   Repeat blood pressure by me was 120/70  Wt Readings from Last 3 Encounters:  06/05/15 207 lb 1.6 oz (93.94 kg)  05/29/15 206 lb 9.6 oz (93.713 kg)  04/17/15 208 lb (94.348 kg)   General: Alert, oriented, no distress.  Mild obesity. Skin: normal turgor, no rashes, warm and dry HEENT: Normocephalic, atraumatic. Pupils equal round and reactive to light; sclera anicteric; extraocular muscles intact; Fundi  Without hemorrhages or exudates Nose without nasal septal hypertrophy Mouth/Parynx benign; Mallinpatti scale 3 Neck: No JVD, no carotid bruits; normal carotid upstroke Lungs: clear to ausculatation and percussion; no wheezing or rales Chest wall: without tenderness to palpitation Heart: PMI not displaced, RRR, s1 s2 normal, 1/6 systolic murmur, no diastolic murmur, no rubs, gallops, thrills, or heaves Abdomen: soft, nontender; no hepatosplenomehaly, BS+; abdominal aorta nontender and not dilated by palpation. Back: no CVA tenderness Pulses 2+ Musculoskeletal: full range of motion, normal strength, no joint deformities Extremities: no clubbing cyanosis or edema, Homan's sign negative  Neurologic: grossly nonfocal; Cranial nerves grossly wnl Psychologic: Normal mood and affect.     January 2017 ECG (independently read by me):  Sinus bradycardia at 57 bpm.  Left axis deviation.  Nonspecific  T-wave changes.   This ECG was compared to the one done on December 19.  The present ECG shows resolution of prior left anterior hemiblock and no longer has QS complex V1 V2.  LABS:  BMP Latest Ref Rng 02/14/2015 09/18/2014 03/15/2014  Glucose 70 - 99 mg/dL 83 89 89  BUN 6 - 23 mg/dL 18 20 17   Creatinine 0.40 - 1.50 mg/dL 1.61 0.96 1.0  Sodium 045 - 145 mEq/L 141 138 137  Potassium 3.5 - 5.1 mEq/L 4.0 4.5 4.0  Chloride 96 - 112 mEq/L 104 103 106  CO2 19 - 32 mEq/L 31 32 28  Calcium 8.4 - 10.5 mg/dL 9.3 9.6 9.0     Hepatic Function Latest Ref Rng 03/15/2014 11/29/2013 09/01/2011  Total Protein 6.0 - 8.3 g/dL 7.1 7.1 6.9  Albumin 3.5 - 5.2 g/dL 4.0 4.1 4.2  AST 0 - 37 U/L 26 37 24  ALT 0 - 53 U/L 41 41 35  Alk Phosphatase 39 - 117 U/L 74 69 85  Total Bilirubin 0.2 - 1.2 mg/dL 0.7 0.8 0.5  Bilirubin, Direct 0.0 - 0.3 mg/dL 0.0 - 0.1    CBC Latest Ref Rng 11/29/2013 08/29/2010 09/16/2007  WBC 4.0 - 10.5 K/uL 7.8 6.7 5.9  Hemoglobin 13.0 - 17.0 g/dL 40.9 81.1 91.4  Hematocrit 39.0 - 52.0 % 44.1 45.0 42.5  Platelets 150.0 - 400.0 K/uL 143.0(L) 158.0 152   Lab Results  Component Value Date   MCV 95.3 11/29/2013   MCV 94.8 08/29/2010   MCV 94.1 09/16/2007   Lab Results  Component Value Date   TSH 2.42 08/29/2010   Lab Results  Component Value Date   HGBA1C 5.6 09/16/2007     BNP No results found for: BNP  ProBNP No results found for: PROBNP   Lipid Panel     Component Value Date/Time   CHOL 193 03/19/2015 0852   TRIG 189.0* 03/19/2015 0852   HDL 35.90* 03/19/2015 0852   CHOLHDL 5 03/19/2015 0852   VLDL 37.8 03/19/2015 0852   LDLCALC 119* 03/19/2015 0852   LDLDIRECT 140.0 08/29/2010 1043  RADIOLOGY: No results found.   ASSESSMENT AND PLAN:  Mr. Rupert Baumhardt is a 70 year old white male who has a history of prior tobacco use for 32 years up to 2 packs per day and quit smoking 17 years ago.  He has cardiac risk factors notable for hypertension as well as  family history for coronary artery disease in grandparents.  He denies any symptoms suggestive of chest pain worrisome for angina.  He does note some mild redness of breath with climbing steps.  His blood pressure today remains stable on his medical regimen consisting of losartan 100 mg daily and metoprolol, tartrate 25 mg twice a day.  His echo Doppler study shows left ventricular hypertrophy.  Due to his history of hypertension, with grade 1 diastolic dysfunction, but he had entirely normal systolic function.  His nuclear perfusion study shows a essentially normal perfusion although mild diaphragmatic attenuation is present.  Post-rest ejection fraction is 56%.  I reviewed the above studies with him in detail.  His lipid studies also suggest mild triglyceride elevation and I have suggested further titration of his omega-3 fatty acids to at least 2 capsules daily.  I discussed weight loss with his mild obesity and body mass index of 33.44.  We discussed the importance of increased exercise at least 5 days per week and to at least get below the "obesity "threshold with a BMI of less than 30.  He will continue his current medical regimen.  His GERD is controlled with omeprazole.  He will return to the routine care of Sandford Craze, but I am happy to see him anytime in the future if problems arise.     Lennette Bihari, MD, [facility] 06/05/2015 12:30 PM

## 2015-06-12 DIAGNOSIS — R972 Elevated prostate specific antigen [PSA]: Secondary | ICD-10-CM | POA: Diagnosis not present

## 2015-06-18 DIAGNOSIS — Z0000 Encounter for general adult medical examination without abnormal findings: Secondary | ICD-10-CM | POA: Diagnosis not present

## 2015-06-18 DIAGNOSIS — N401 Enlarged prostate with lower urinary tract symptoms: Secondary | ICD-10-CM | POA: Diagnosis not present

## 2015-06-18 DIAGNOSIS — R972 Elevated prostate specific antigen [PSA]: Secondary | ICD-10-CM | POA: Diagnosis not present

## 2015-06-18 DIAGNOSIS — R350 Frequency of micturition: Secondary | ICD-10-CM | POA: Diagnosis not present

## 2015-08-07 DIAGNOSIS — L918 Other hypertrophic disorders of the skin: Secondary | ICD-10-CM | POA: Diagnosis not present

## 2015-08-07 DIAGNOSIS — D225 Melanocytic nevi of trunk: Secondary | ICD-10-CM | POA: Diagnosis not present

## 2015-08-07 DIAGNOSIS — L720 Epidermal cyst: Secondary | ICD-10-CM | POA: Diagnosis not present

## 2015-08-07 DIAGNOSIS — L218 Other seborrheic dermatitis: Secondary | ICD-10-CM | POA: Diagnosis not present

## 2015-08-07 DIAGNOSIS — D1801 Hemangioma of skin and subcutaneous tissue: Secondary | ICD-10-CM | POA: Diagnosis not present

## 2015-08-07 DIAGNOSIS — L821 Other seborrheic keratosis: Secondary | ICD-10-CM | POA: Diagnosis not present

## 2015-08-07 DIAGNOSIS — D0339 Melanoma in situ of other parts of face: Secondary | ICD-10-CM | POA: Diagnosis not present

## 2015-08-07 DIAGNOSIS — D485 Neoplasm of uncertain behavior of skin: Secondary | ICD-10-CM | POA: Diagnosis not present

## 2015-09-06 DIAGNOSIS — D0339 Melanoma in situ of other parts of face: Secondary | ICD-10-CM | POA: Diagnosis not present

## 2015-09-06 HISTORY — PX: MOHS SURGERY: SUR867

## 2015-09-17 DIAGNOSIS — R972 Elevated prostate specific antigen [PSA]: Secondary | ICD-10-CM | POA: Diagnosis not present

## 2015-09-18 ENCOUNTER — Ambulatory Visit: Payer: Medicare Other | Admitting: Family

## 2015-09-24 DIAGNOSIS — R972 Elevated prostate specific antigen [PSA]: Secondary | ICD-10-CM | POA: Diagnosis not present

## 2015-09-24 DIAGNOSIS — R351 Nocturia: Secondary | ICD-10-CM | POA: Diagnosis not present

## 2015-09-24 DIAGNOSIS — N401 Enlarged prostate with lower urinary tract symptoms: Secondary | ICD-10-CM | POA: Diagnosis not present

## 2015-09-26 ENCOUNTER — Encounter: Payer: Self-pay | Admitting: Family

## 2015-09-26 ENCOUNTER — Ambulatory Visit (INDEPENDENT_AMBULATORY_CARE_PROVIDER_SITE_OTHER): Payer: Medicare Other | Admitting: Family

## 2015-09-26 VITALS — BP 110/78 | HR 76 | Temp 98.3°F | Resp 16 | Ht 65.0 in | Wt 209.8 lb

## 2015-09-26 DIAGNOSIS — C439 Malignant melanoma of skin, unspecified: Secondary | ICD-10-CM

## 2015-09-26 DIAGNOSIS — K2100 Gastro-esophageal reflux disease with esophagitis, without bleeding: Secondary | ICD-10-CM

## 2015-09-26 DIAGNOSIS — I10 Essential (primary) hypertension: Secondary | ICD-10-CM

## 2015-09-26 DIAGNOSIS — E785 Hyperlipidemia, unspecified: Secondary | ICD-10-CM | POA: Diagnosis not present

## 2015-09-26 HISTORY — DX: Malignant melanoma of skin, unspecified: C43.9

## 2015-09-26 NOTE — Assessment & Plan Note (Signed)
BP stable on current meds, continue same,obtain bmet.  

## 2015-09-26 NOTE — Assessment & Plan Note (Signed)
S/p resection, will request dermatology records.

## 2015-09-26 NOTE — Progress Notes (Signed)
Pre visit review using our clinic review tool, if applicable. No additional management support is needed unless otherwise documented below in the visit note. 

## 2015-09-26 NOTE — Progress Notes (Signed)
Subjective:    Patient ID: Gerald [name], male    DOB: 1946/02/10, 70 y.o.   MRN: [id number]  HPI  Gerald [name] is a 70 yr old male who presents today for follow up.  1) HTN- on losartan, metoprolol. Denies CP/SOB or swelling BP Readings from Last 3 Encounters:  09/26/15 110/78  06/05/15 126/82  05/29/15 130/70   2) Hyperlipidemia- on fish oil, not compliant with diet.  Lab Results  Component Value Date   CHOL 193 03/19/2015   HDL 35.90* 03/19/2015   LDLCALC 119* 03/19/2015   LDLDIRECT 140.0 08/29/2010   TRIG 189.0* 03/19/2015   CHOLHDL 5 03/19/2015   3) GERD/hx of esophageal stricture- on prilosec, stable no symptoms.  Denies dysphagia.    Melanoma right cheek, had resection right cheek. Sees. [place] dermat[facility] Dr. Leonie Man  Review of Systems See HPI  Past Medical History  Diagnosis Date  . Hypertension   . Hyperlipidemia   . 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 Alcohol abuse     quit 1989  . Hiatal hernia     EGD 3/10/3 stricture  . OSA (obstructive sleep apnea)     mild, per sleep study  . Onychomycosis   . Diverticulosis   . Hemorrhoids   . PVD (peripheral vascular disease) (HCC)   . Fatty liver   . Hx of adenomatous colonic polyps   . GERD (gastroesophageal reflux disease)   . Esophageal stricture   . Insomnia   . Back pain, chronic   . Sleep apnea     does not wear of c-pap  . Depression   . Melanoma (HCC)     right cheek     Social History   Social History  . Marital Status: Married    Spouse Name: N/A  . Number of Children: 2  . Years of Education: N/A   Occupational History  . Retired    Social History Main Topics  . Smoking status: Former Smoker    Quit date: 08/29/2006  . Smokeless tobacco: Never Used  . Alcohol Use: No     Comment: quit alcohol 1989  . Drug Use: No  . Sexual Activity: Not on file   Other Topics Concern  . Not on file   Social History Narrative   Diet: regular, lots of  vegetables-----exercise : none but active   Caffeine use:  5 cups coffee daily          Past Surgical History  Procedure Laterality Date  . Tonsillectomy and adenoidectomy    . Cystectomy      Right leg remotely, back 2012  . Mass removal      left shoulder  . Wisdom tooth extraction    . Colonoscopy    . Upper gastrointestinal endoscopy      Family History  Problem Relation Age of Onset  . Coronary artery disease Father   . Colon cancer Father   . Heart attack Paternal Grandfather   . Coronary artery disease Maternal Grandmother   . Colon cancer Maternal Grandmother   . Cancer Maternal Grandmother     colon  . Hypertension Maternal Aunt   . Prostate cancer Neg Hx   . Stroke Neg Hx   . Esophageal cancer Neg Hx   . Pancreatic cancer Neg Hx   . Rectal cancer Neg Hx   . Stomach cancer Neg Hx   . Melanoma Mother   . Coronary artery disease  Paternal Grandmother   . Heart attack Maternal Grandfather     Allergies  Allergen Reactions  . Penicillins     Pt unsure of reaction    Current Outpatient Prescriptions on File Prior to Visit  Medication Sig Dispense Refill  . aspirin 81 MG tablet Take 81 mg by mouth every other day.    . Calcium Carbonate-Vit D-Min (CALCIUM 1200 PO) Take 1 tablet by mouth daily.    Marland Kitchen ketoconazole (NIZORAL) 2 % shampoo Apply topically as needed. For eczema    . losartan (COZAAR) 100 MG tablet Take 1 tablet (100 mg total) by mouth daily. 90 tablet 3  . MAGNESIUM PO Take 1,250 mg by mouth once a week.     . metoprolol tartrate (LOPRESSOR) 25 MG tablet TAKE 1 TABLET BY MOUTH TWICE DAILY 60 tablet 5  . Misc Natural Products (MENS PROSTATE HEALTH FORMULA PO) Take 1 tablet by mouth 3 (three) times daily.    . Multiple Vitamins-Minerals (MULTIVITAMIN ADULTS 50+ PO) Take 1 tablet by mouth daily.    . Omega-3 Fatty Acids (FISH OIL) 1200 MG CAPS Take 1 capsule by mouth daily.    Marland Kitchen omeprazole (PRILOSEC) 20 MG capsule Take 1 capsule (20 mg total) by mouth  daily. 30 capsule 3  . tamsulosin (FLOMAX) 0.4 MG CAPS capsule Take 0.4 mg by mouth 2 (two) times daily.     No current facility-administered medications on file prior to visit.    BP 110/78 mmHg  Pulse 76  Temp(Src) 98.3 F (36.8 C) (Oral)  Resp 16  Ht 5\' 5"  (1.651 m)  Wt 209 lb 12.8 oz (95.165 kg)  BMI 34.91 kg/m2  SpO2 96%       Objective:   Physical Exam  Constitutional: He is oriented to person, place, and time. He appears well-developed and well-nourished. No distress.  HENT:  Head: Normocephalic and atraumatic.  Cardiovascular: Normal rate and regular rhythm.   No murmur heard. Pulmonary/Chest: Effort normal and breath sounds normal. No respiratory distress. He has no wheezes. He has no rales.  Musculoskeletal: He exhibits no edema.  Neurological: He is alert and oriented to person, place, and time.  Skin: Skin is warm and dry.  Vertical scar right cheek, appears well approximated and heeling well.   Psychiatric: He has a normal mood and affect. His behavior is normal. Thought content normal.          Assessment & Plan:

## 2015-09-26 NOTE — Assessment & Plan Note (Signed)
He will return for flp, continue fish oil.

## 2015-09-26 NOTE — Assessment & Plan Note (Signed)
Stable on PPI, continue same.  

## 2015-09-26 NOTE — Patient Instructions (Signed)
Please schedule a fasting lab appointment at the front desk.

## 2015-10-01 ENCOUNTER — Other Ambulatory Visit: Payer: Medicare Other

## 2015-10-03 ENCOUNTER — Other Ambulatory Visit: Payer: Medicare Other

## 2015-10-03 LAB — LIPID PANEL
Cholesterol: 200 mg/dL (ref 0–200)
HDL: 37.3 mg/dL — ABNORMAL LOW (ref >39.00)
LDL Cholesterol: 135 mg/dL — ABNORMAL HIGH (ref 0–99)
NonHDL: 162.71
Total CHOL/HDL Ratio: 5
Triglycerides: 137 mg/dL (ref 0.0–149.0)
VLDL: 27.4 mg/dL (ref 0.0–40.0)

## 2015-10-03 LAB — BASIC METABOLIC PANEL
BUN: 19 mg/dL (ref 6–23)
CO2: 31 mEq/L (ref 19–32)
Calcium: 9.2 mg/dL (ref 8.4–10.5)
Chloride: 105 mEq/L (ref 96–112)
Creatinine, Ser: 0.94 mg/dL (ref 0.40–1.50)
GFR: 84.56 mL/min (ref >60.00)
Glucose, Bld: 89 mg/dL (ref 70–99)
Potassium: 4.1 mEq/L (ref 3.5–5.1)
Sodium: 140 mEq/L (ref 135–145)

## 2015-10-04 ENCOUNTER — Encounter: Payer: Self-pay | Admitting: Family

## 2015-10-12 ENCOUNTER — Telehealth: Payer: Self-pay | Admitting: *Deleted

## 2015-10-12 NOTE — Telephone Encounter (Signed)
Forwarded to Melissa. JG//CMA  

## 2015-11-05 ENCOUNTER — Other Ambulatory Visit: Payer: Self-pay | Admitting: Family

## 2015-11-05 NOTE — Telephone Encounter (Signed)
Medication filled to pharmacy as requested.   

## 2015-11-06 DIAGNOSIS — N400 Enlarged prostate without lower urinary tract symptoms: Secondary | ICD-10-CM | POA: Diagnosis not present

## 2015-11-06 DIAGNOSIS — R972 Elevated prostate specific antigen [PSA]: Secondary | ICD-10-CM | POA: Diagnosis not present

## 2016-01-14 ENCOUNTER — Other Ambulatory Visit: Payer: Self-pay | Admitting: Family

## 2016-01-15 DIAGNOSIS — Z23 Encounter for immunization: Secondary | ICD-10-CM | POA: Diagnosis not present

## 2016-02-15 DIAGNOSIS — L821 Other seborrheic keratosis: Secondary | ICD-10-CM | POA: Diagnosis not present

## 2016-02-15 DIAGNOSIS — L218 Other seborrheic dermatitis: Secondary | ICD-10-CM | POA: Diagnosis not present

## 2016-02-15 DIAGNOSIS — Z85820 Personal history of malignant melanoma of skin: Secondary | ICD-10-CM | POA: Diagnosis not present

## 2016-02-15 DIAGNOSIS — D1801 Hemangioma of skin and subcutaneous tissue: Secondary | ICD-10-CM | POA: Diagnosis not present

## 2016-02-15 DIAGNOSIS — L918 Other hypertrophic disorders of the skin: Secondary | ICD-10-CM | POA: Diagnosis not present

## 2016-03-05 ENCOUNTER — Telehealth: Payer: Self-pay | Admitting: *Deleted

## 2016-03-05 NOTE — Telephone Encounter (Signed)
Appt scheduled for 04/05/15 @11am .

## 2016-04-02 ENCOUNTER — Encounter: Payer: Self-pay | Admitting: Family

## 2016-04-02 NOTE — Progress Notes (Unsigned)
Subjective:   Gerald [name] is a 71 y.o. male who presents for Medicare Annual/Subsequent preventive examination.  Review of Systems:  No ROS.  Medicare Wellness Visit.  Sleep patterns: {SX; SLEEP PATTERNS:18802::"feels rested on waking","does not get up to void","gets up *** times nightly to void","*** hours nightly"}.   Home Safety/Smoke Alarms:   Living environment; residence and Firearm Safety: {Rehab home environment / accessibility:30080::"no firearms","firearms stored safely"}. Seat Belt Safety/Bike Helmet: Wears seat belt.   Counseling:   Eye Exam-  Dental-  Male:   CCS- Last 12/02/06:Diverticulosis. No recall on file. PSA-  Lab Results  Component Value Date   PSA 6.28 (H) 09/01/2011   PSA 5.14 (H) 05/14/2011   PSA 4.32 (H) 10/23/2010        Objective:    Vitals: There were no vitals taken for this visit.  There is no height or weight on file to calculate BMI.  Tobacco History  Smoking Status  . Former Smoker  . Quit date: 08/29/2006  Smokeless Tobacco  . Never Used     Counseling given: Not Answered   Past Medical History:  Diagnosis Date  . Alcohol abuse    quit 1989  . Back pain, chronic   . Depression   . Diverticulosis   . Esophageal stricture   . Fatty liver   . GERD (gastroesophageal reflux disease)   . Hemorrhoids   . Hiatal hernia    EGD 3/10/3 stricture  . Hx of adenomatous colonic polyps   . Hyperlipidemia   . Hypertension   . Insomnia   . Melanoma ([place])    right cheek  . 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 Onychomycosis   . OSA (obstructive sleep apnea)    mild, per sleep study  . PVD (peripheral vascular disease) ([place])   . Sleep apnea    does not wear of c-pap   Past Surgical History:  Procedure Laterality Date  . COLONOSCOPY    . CYSTECTOMY     Right leg remotely, back 2012  . mass removal     left shoulder  . TONSILLECTOMY AND ADENOIDECTOMY    . UPPER GASTROINTESTINAL ENDOSCOPY     . WISDOM TOOTH EXTRACTION     Family History  Problem Relation Age of Onset  . Coronary artery disease Father   . Colon cancer Father   . Heart attack Paternal Grandfather   . Coronary artery disease Maternal Grandmother   . Colon cancer Maternal Grandmother   . Cancer Maternal Grandmother     colon  . Hypertension Maternal Aunt   . Prostate cancer Neg Hx   . Stroke Neg Hx   . Esophageal cancer Neg Hx   . Pancreatic cancer Neg Hx   . Rectal cancer Neg Hx   . Stomach cancer Neg Hx   . Melanoma Mother   . Coronary artery disease Paternal Grandmother   . Heart attack Maternal Grandfather    History  Sexual Activity  . Sexual activity: Not on file    Outpatient Encounter Prescriptions as of 04/04/2016  Medication Sig  . aspirin 81 MG tablet Take 81 mg by mouth every other day.  . Calcium Carbonate-Vit D-Min (CALCIUM 1200 PO) Take 1 tablet by mouth daily.  . finasteride (PROSCAR) 5 MG tablet Take 5 mg by mouth daily.  Marland Kitchen ketoconazole (NIZORAL) 2 % shampoo Apply topically as needed. For eczema  . losartan (COZAAR) 100 MG tablet TAKE 1 TABLET(100 MG)  BY MOUTH DAILY  . MAGNESIUM PO Take 1,250 mg by mouth once a week.   . metoprolol tartrate (LOPRESSOR) 25 MG tablet TAKE 1 TABLET BY MOUTH TWICE DAILY  . Misc Natural Products (MENS PROSTATE HEALTH FORMULA PO) Take 1 tablet by mouth 3 (three) times daily.  . Multiple Vitamins-Minerals (MULTIVITAMIN ADULTS 50+ PO) Take 1 tablet by mouth daily.  . Omega-3 Fatty Acids (FISH OIL) 1200 MG CAPS Take 1 capsule by mouth daily.  Marland Kitchen omeprazole (PRILOSEC) 20 MG capsule Take 1 capsule (20 mg total) by mouth daily.  . tamsulosin (FLOMAX) 0.4 MG CAPS capsule Take 0.4 mg by mouth 2 (two) times daily.   No facility-administered encounter medications on file as of 04/04/2016.     Activities of Daily Living In your present state of health, do you have any difficulty performing the following activities: 09/26/2015  Hearing? N  Vision? N  Difficulty  concentrating or making decisions? N  Walking or climbing stairs? N  Dressing or bathing? N  Doing errands, shopping? N  Some recent data might be hidden    Patient Care Team: Debbrah Alar, NP as PCP - General (Internal Medicine)   Assessment:    Physical assessment deferred to PCP.  Exercise Activities and Dietary recommendations   Diet (meal preparation, eat out, water intake, caffeinated beverages, dairy products, fruits and vegetables): {Desc; diets:16563} Breakfast: Lunch:  Dinner:      Goals    None     Fall Risk Fall Risk  09/26/2015 03/19/2015 03/15/2014 08/04/2013  Falls in the past year? No No No No   Depression Screen PHQ 2/9 Scores 09/26/2015 03/19/2015 03/19/2015 03/15/2014  PHQ - 2 Score 0 0 0 0    Cognitive Function        Immunization History  Administered Date(s) Administered  . Influenza Split 12/30/2010, 01/06/2012  . Influenza Whole 02/18/2007, 03/13/2009, 01/31/2010  . Influenza-Unspecified 01/29/2013, 12/29/2013, 01/15/2016  . Pneumococcal Conjugate-13 03/15/2014  . Pneumococcal Polysaccharide-23 05/29/2005, 01/06/2012  . Td 04/20/2001, 03/15/2014  . Zoster 08/27/2011   Screening Tests Health Maintenance  Topic Date Due  . COLONOSCOPY  02/16/2024  . TETANUS/TDAP  03/15/2024  . INFLUENZA VACCINE  Completed  . ZOSTAVAX  Addressed  . Hepatitis C Screening  Completed  . PNA vac Low Risk Adult  Completed      Plan:    During the course of the visit the patient was educated and counseled about the following appropriate screening and preventive services:   Vaccines to include Pneumoccal, Influenza, Hepatitis B, Td, Zostavax, HCV  Electrocardiogram  Cardiovascular Disease  Colorectal cancer screening  Diabetes screening  Prostate Cancer Screening  Glaucoma screening  Nutrition counseling   Smoking cessation counseling  Patient Instructions (the written plan) was given to the patient.    Shela Nevin,  [place]  04/02/2016

## 2016-04-02 NOTE — Progress Notes (Unsigned)
Pre visit review using our clinic review tool, if applicable. No additional management support is needed unless otherwise documented below in the visit note. 

## 2016-04-04 ENCOUNTER — Encounter: Payer: Self-pay | Admitting: Family

## 2016-04-04 ENCOUNTER — Ambulatory Visit: Payer: Medicare Other | Admitting: *Deleted

## 2016-04-04 ENCOUNTER — Ambulatory Visit (INDEPENDENT_AMBULATORY_CARE_PROVIDER_SITE_OTHER): Payer: Medicare Other | Admitting: Family

## 2016-04-04 VITALS — BP 132/82 | HR 63 | Temp 98.2°F | Resp 16 | Ht 65.0 in | Wt 207.6 lb

## 2016-04-04 DIAGNOSIS — E785 Hyperlipidemia, unspecified: Secondary | ICD-10-CM | POA: Diagnosis not present

## 2016-04-04 DIAGNOSIS — R202 Paresthesia of skin: Secondary | ICD-10-CM | POA: Diagnosis not present

## 2016-04-04 DIAGNOSIS — Z0000 Encounter for general adult medical examination without abnormal findings: Secondary | ICD-10-CM | POA: Diagnosis not present

## 2016-04-04 DIAGNOSIS — I10 Essential (primary) hypertension: Secondary | ICD-10-CM

## 2016-04-04 DIAGNOSIS — R631 Polydipsia: Secondary | ICD-10-CM

## 2016-04-04 DIAGNOSIS — Z1211 Encounter for screening for malignant neoplasm of colon: Secondary | ICD-10-CM

## 2016-04-04 LAB — VITAMIN B12: Vitamin B-12: 545 pg/mL (ref 211–911)

## 2016-04-04 LAB — BASIC METABOLIC PANEL
BUN: 24 mg/dL — AB (ref 6–23)
CALCIUM: 9.3 mg/dL (ref 8.4–10.5)
CO2: 30 mEq/L (ref 19–32)
CREATININE: 0.91 mg/dL (ref 0.40–1.50)
Chloride: 103 mEq/L (ref 96–112)
GFR: 87.66 mL/min (ref >60.00)
GLUCOSE: 95 mg/dL (ref 70–99)
Potassium: 3.9 mEq/L (ref 3.5–5.1)
Sodium: 139 mEq/L (ref 135–145)

## 2016-04-04 LAB — FOLATE: Folate: >23.4 ng/mL (ref >5.9)

## 2016-04-04 NOTE — Patient Instructions (Addendum)
Please complete lab work prior to leaving. Continue to work on [employer], exercise and weight loss.   Order placed- schedule colonoscopy. Continue to eat heart healthy diet (full of fruits, vegetables, whole grains, lean protein, water--limit salt, fat, and sugar intake) and increase physical activity as tolerated. Continue doing brain stimulating activities (puzzles, reading, adult coloring books, staying active) to keep memory sharp.

## 2016-04-04 NOTE — Assessment & Plan Note (Signed)
Follow up BP looks ok today, continue same.

## 2016-04-04 NOTE — Progress Notes (Signed)
Subjective:    Patient ID: Gerald [name], male    DOB: 28-Dec-1945, 71 y.o.   MRN: [id number]  HPI  Mr. Pagano is a 71 yr old male who presents today for follow up.  1) HTN- on metoprolol and losartan. Denies cp/sob or swelling. BP Readings from Last 3 Encounters:  04/04/16 132/82  09/26/15 110/78  06/05/15 126/82   2) Hyperlipidemia- plans to  begin a diet.   Lab Results  Component Value Date   CHOL 200 10/03/2015   HDL 37.30 (L) 10/03/2015   LDLCALC 135 (H) 10/03/2015   LDLDIRECT 140.0 08/29/2010   TRIG 137.0 10/03/2015   CHOLHDL 5 10/03/2015   3) Polydipsia-  Notes that it comes and goes.   4) numbness- bilateral feet. (mainly in his toes).  Denies low back pain.     Review of Systems See HPI  Past Medical History:  Diagnosis Date  . Alcohol abuse    quit 1989  . Back pain, chronic   . Depression   . Diverticulosis   . Esophageal stricture   . Fatty liver   . GERD (gastroesophageal reflux disease)   . Hemorrhoids   . Hiatal hernia    EGD 3/10/3 stricture  . Hx of adenomatous colonic polyps   . Hyperlipidemia   . Hypertension   . Insomnia   . Melanoma ([facility])    right cheek  . 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 Onychomycosis   . OSA (obstructive sleep apnea)    mild, per sleep study  . PVD (peripheral vascular disease) ([facility])   . Sleep apnea    does not wear of c-pap     Social History   Social History  . Marital status: Married    Spouse name: N/A  . Number of children: 2  . Years of education: N/A   Occupational History  . Retired    Social History Main Topics  . Smoking status: Former Smoker    Quit date: 08/29/2006  . Smokeless tobacco: Never Used  . Alcohol use No     Comment: quit alcohol 1989  . Drug use: No  . Sexual activity: Not on file   Other Topics Concern  . Not on file   Social History Narrative   Diet: regular, lots of vegetables-----exercise : none but active   Caffeine use:  5  cups coffee daily          Past Surgical History:  Procedure Laterality Date  . COLONOSCOPY    . CYSTECTOMY     Right leg remotely, back 2012  . mass removal     left shoulder  . TONSILLECTOMY AND ADENOIDECTOMY    . UPPER GASTROINTESTINAL ENDOSCOPY    . WISDOM TOOTH EXTRACTION      Family History  Problem Relation Age of Onset  . Coronary artery disease Father   . Colon cancer Father   . Heart attack Paternal Grandfather   . Coronary artery disease Maternal Grandmother   . Colon cancer Maternal Grandmother   . Cancer Maternal Grandmother     colon  . Hypertension Maternal Aunt   . Prostate cancer Neg Hx   . Stroke Neg Hx   . Esophageal cancer Neg Hx   . Pancreatic cancer Neg Hx   . Rectal cancer Neg Hx   . Stomach cancer Neg Hx   . Melanoma Mother   . Coronary artery disease Paternal Grandmother   . Heart  attack Maternal Grandfather     Allergies  Allergen Reactions  . Penicillins     Pt unsure of reaction    Current Outpatient Prescriptions on File Prior to Visit  Medication Sig Dispense Refill  . aspirin 81 MG tablet Take 81 mg by mouth every other day.    . finasteride (PROSCAR) 5 MG tablet Take 5 mg by mouth daily.  11  . ketoconazole (NIZORAL) 2 % shampoo Apply topically as needed. For eczema    . losartan (COZAAR) 100 MG tablet TAKE 1 TABLET(100 MG) BY MOUTH DAILY 90 tablet 0  . MAGNESIUM PO Take 1,250 mg by mouth once a week.     . metoprolol tartrate (LOPRESSOR) 25 MG tablet TAKE 1 TABLET BY MOUTH TWICE DAILY 60 tablet 5  . Misc Natural Products (MENS PROSTATE HEALTH FORMULA PO) Take 1 tablet by mouth 3 (three) times daily.    . Multiple Vitamins-Minerals (MULTIVITAMIN ADULTS 50+ PO) Take 1 tablet by mouth daily.    . Omega-3 Fatty Acids (FISH OIL) 1200 MG CAPS Take 1 capsule by mouth daily.    Marland Kitchen omeprazole (PRILOSEC) 20 MG capsule Take 1 capsule (20 mg total) by mouth daily. 30 capsule 3  . tamsulosin (FLOMAX) 0.4 MG CAPS capsule Take 0.4 mg by mouth 2  (two) times daily.     No current facility-administered medications on file prior to visit.     BP 132/82   Pulse 63   Temp 98.2 F (36.8 C) (Oral)   Resp 16   Ht 5\' 5"  (1.651 m)   Wt 207 lb 9.6 oz (94.2 kg)   SpO2 99% Comment: room air  BMI 34.55 kg/m       Objective:   Physical Exam  Constitutional: He is oriented to person, place, and time. He appears well-developed and well-nourished. No distress.  HENT:  Head: Normocephalic and atraumatic.  Cardiovascular: Normal rate and regular rhythm.   No murmur heard. Pulmonary/Chest: Effort normal and breath sounds normal. No respiratory distress. He has no wheezes. He has no rales.  Musculoskeletal: He exhibits no edema.  Neurological: He is alert and oriented to person, place, and time.  Skin: Skin is warm and dry.  Psychiatric: He has a normal mood and affect. His behavior is normal. Thought content normal.          Assessment & Plan:  Polydipsia- obtain follow up glucse.   Paresthesia- obtain b12 and folate levels.

## 2016-04-04 NOTE — Progress Notes (Signed)
Pre visit review using our clinic review tool, if applicable. No additional management support is needed unless otherwise documented below in the visit note. 

## 2016-04-04 NOTE — Assessment & Plan Note (Signed)
Discussed diet/exercise and weight loss.

## 2016-04-04 NOTE — Progress Notes (Signed)
Subjective:   Gerald [name] is a 71 y.o. male who presents for Medicare Annual/Subsequent preventive examination.  Review of Systems:  No ROS.  Medicare Wellness Visit. Cardiac Risk Factors include: advanced age (>23men, >47 women);dyslipidemia;hypertension;sedentary lifestyle;obesity (BMI >30kg/m2);male gender Sleep patterns: Up 3-4 times per night to urinate.  Follows with [facility]. Feels rested most of the time. Home Safety/Smoke Alarms:  Smoke and carbon monoxide detectors. Living environment; residence and Firearm Safety: Lives at home with wife and son that has CP. Caregiver comes 8hrs per day.Guns safely stored. Feels safe. Seat Belt Safety/Bike Helmet: Wears seat belt.   Counseling:   Eye Exam- Follows with eye group in [place] annually. Wears glasses for reading. Dental- Dr.Jay Minor every 6 months.  Male:   CCS-  Last 12/02/06: Diverticulosis. Order placed for f/u screening. PSA-  Lab Results  Component Value Date   PSA 6.28 (H) 09/01/2011   PSA 5.14 (H) 05/14/2011   PSA 4.32 (H) 10/23/2010        Objective:    Vitals: BP 132/82   Pulse 63   Temp 98.2 F (36.8 C) (Oral)   Resp 16   Ht 5\' 5"  (1.651 m)   Wt 207 lb 9.6 oz (94.2 kg)   SpO2 99% Comment: room air  BMI 34.55 kg/m   Body mass index is 34.55 kg/m.  Tobacco History  Smoking Status  . Former Smoker  . Quit date: 08/29/2006  Smokeless Tobacco  . Never Used     Counseling given: Not Answered   Past Medical History:  Diagnosis Date  . Alcohol abuse    quit 1989  . Back pain, chronic   . Depression   . Diverticulosis   . Esophageal stricture   . Fatty liver   . GERD (gastroesophageal reflux disease)   . Hemorrhoids   . Hiatal hernia    EGD 3/10/3 stricture  . Hx of adenomatous colonic polyps   . Hyperlipidemia   . Hypertension   . Insomnia   . Melanoma ([place])    right cheek  . 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 Onychomycosis   . OSA  (obstructive sleep apnea)    mild, per sleep study  . PVD (peripheral vascular disease) ([place])   . Sleep apnea    does not wear of c-pap   Past Surgical History:  Procedure Laterality Date  . COLONOSCOPY    . CYSTECTOMY     Right leg remotely, back 2012  . mass removal     left shoulder  . Melonoma removed from face    . TONSILLECTOMY AND ADENOIDECTOMY    . UPPER GASTROINTESTINAL ENDOSCOPY    . WISDOM TOOTH EXTRACTION     Family History  Problem Relation Age of Onset  . Coronary artery disease Father   . Colon cancer Father   . Heart attack Paternal Grandfather   . Coronary artery disease Maternal Grandmother   . Colon cancer Maternal Grandmother   . Cancer Maternal Grandmother     colon  . Melanoma Mother   . Coronary artery disease Paternal Grandmother   . Heart attack Maternal Grandfather   . Hypertension Maternal Aunt   . Prostate cancer Neg Hx   . Stroke Neg Hx   . Esophageal cancer Neg Hx   . Pancreatic cancer Neg Hx   . Rectal cancer Neg Hx   . Stomach cancer Neg Hx    History  Sexual Activity  .  Sexual activity: Not on file    Outpatient Encounter Prescriptions as of 04/04/2016  Medication Sig  . aspirin 81 MG tablet Take 81 mg by mouth every other day.  . finasteride (PROSCAR) 5 MG tablet Take 5 mg by mouth daily.  Marland Kitchen ketoconazole (NIZORAL) 2 % shampoo Apply topically as needed. For eczema  . losartan (COZAAR) 100 MG tablet TAKE 1 TABLET(100 MG) BY MOUTH DAILY  . MAGNESIUM PO Take 1,250 mg by mouth once a week.   . metoprolol tartrate (LOPRESSOR) 25 MG tablet TAKE 1 TABLET BY MOUTH TWICE DAILY  . Misc Natural Products (MENS PROSTATE HEALTH FORMULA PO) Take 1 tablet by mouth 3 (three) times daily.  . Multiple Vitamins-Minerals (MULTIVITAMIN ADULTS 50+ PO) Take 1 tablet by mouth daily.  . Omega-3 Fatty Acids (FISH OIL) 1200 MG CAPS Take 1 capsule by mouth daily.  Marland Kitchen omeprazole (PRILOSEC) 20 MG capsule Take 1 capsule (20 mg total) by mouth daily.  . tamsulosin  (FLOMAX) 0.4 MG CAPS capsule Take 0.4 mg by mouth 2 (two) times daily.  . [DISCONTINUED] Calcium Carbonate-Vit D-Min (CALCIUM 1200 PO) Take 1 tablet by mouth daily.   No facility-administered encounter medications on file as of 04/04/2016.     Activities of Daily Living In your present state of health, do you have any difficulty performing the following activities: 04/04/2016 09/26/2015  Hearing? N N  Vision? N N  Difficulty concentrating or making decisions? N N  Walking or climbing stairs? N N  Dressing or bathing? N N  Doing errands, shopping? N N  Preparing Food and eating ? N -  Using the Toilet? N -  In the past six months, have you accidently leaked urine? N -  Do you have problems with loss of bowel control? N -  Managing your Medications? N -  Managing your Finances? N -  Housekeeping or managing your Housekeeping? N -  Some recent data might be hidden    Patient Care Team: Debbrah Alar, NP as PCP - General (Internal Medicine)   Assessment:    Physical assessment deferred to PCP.  Exercise Activities and Dietary recommendations  Current Exercise Habits: The patient does not participate in regular exercise at present   Diet (meal preparation, eat out, water intake, caffeinated beverages, dairy products, fruits and vegetables): in general, a "healthy" diet  , well balanced, on average, 3 meals per day   Goals    . Increase physical activity    . Weight (lb) < 175 lb (79.4 kg)      Fall Risk Fall Risk  04/04/2016 09/26/2015 03/19/2015 03/15/2014 08/04/2013  Falls in the past year? No No No No No   Depression Screen PHQ 2/9 Scores 04/04/2016 09/26/2015 03/19/2015 03/19/2015  PHQ - 2 Score 0 0 0 0    Cognitive Function MMSE - Mini Mental State Exam 04/04/2016  Orientation to time 5  Orientation to Place 5  Registration 3  Attention/ Calculation 5  Recall 2  Language- name 2 objects 2  Language- repeat 1  Language- follow 3 step command 3  Language- read &  follow direction 1  Write a sentence 1  Copy design 1  Total score 29        Immunization History  Administered Date(s) Administered  . Influenza Split 12/30/2010, 01/06/2012  . Influenza Whole 02/18/2007, 03/13/2009, 01/31/2010  . Influenza-Unspecified 01/29/2013, 12/29/2013, 01/15/2016  . Pneumococcal Conjugate-13 03/15/2014  . Pneumococcal Polysaccharide-23 05/29/2005, 01/06/2012  . Td 04/20/2001, 03/15/2014  . Zoster 08/27/2011  Screening Tests Health Maintenance  Topic Date Due  . COLONOSCOPY  02/16/2024  . TETANUS/TDAP  03/15/2024  . INFLUENZA VACCINE  Completed  . ZOSTAVAX  Addressed  . Hepatitis C Screening  Completed  . PNA vac Low Risk Adult  Completed      Plan:     Order placed- schedule colonoscopy. Continue to eat heart healthy diet (full of fruits, vegetables, whole grains, lean protein, water--limit salt, fat, and sugar intake) and increase physical activity as tolerated. Continue doing brain stimulating activities (puzzles, reading, adult coloring books, staying active) to keep memory sharp.   During the course of the visit the patient was educated and counseled about the following appropriate screening and preventive services:   Vaccines to include Pneumoccal, Influenza, Hepatitis B, Td, Zostavax, HCV  Cardiovascular Disease  Colorectal cancer screening  Diabetes screening  Prostate Cancer Screening  Glaucoma screening  Nutrition counseling    Patient Instructions (the written plan) was given to the patient.    Shela Nevin, [place]  04/04/2016

## 2016-04-04 NOTE — Progress Notes (Signed)
Noted and agree. 

## 2016-04-14 ENCOUNTER — Ambulatory Visit (INDEPENDENT_AMBULATORY_CARE_PROVIDER_SITE_OTHER): Payer: Medicare Other | Admitting: Physician Assistant

## 2016-04-14 ENCOUNTER — Encounter: Payer: Self-pay | Admitting: Physician Assistant

## 2016-04-14 VITALS — BP 126/53 | HR 62 | Temp 98.6°F | Resp 18 | Ht 65.0 in | Wt 204.4 lb

## 2016-04-14 DIAGNOSIS — J069 Acute upper respiratory infection, unspecified: Secondary | ICD-10-CM

## 2016-04-14 LAB — POCT RAPID STREP A (OFFICE): RAPID STREP A SCREEN: NEGATIVE

## 2016-04-14 LAB — POCT INFLUENZA A: RAPID INFLUENZA A AGN: NEGATIVE

## 2016-04-14 MED ORDER — OSELTAMIVIR PHOSPHATE 75 MG PO CAPS: 75.0000 mg | ORAL_CAPSULE | Freq: Every day | ORAL | 0 refills | Status: AC

## 2016-04-14 MED ORDER — OSELTAMIVIR PHOSPHATE 75 MG PO CAPS: 75.0000 mg | ORAL_CAPSULE | Freq: Every day | ORAL | 0 refills | Status: DC

## 2016-04-14 MED FILL — OSELTAMIVIR PHOS 75 MG CAP: 75 | 10 days supply | Qty: 10 | Fill #0

## 2016-04-14 NOTE — Patient Instructions (Addendum)
It was great meeting you today!  Stay hydrated. You may take Delsym.   Please take your medicine as prescribed. Call us if you develop any worsening fever, shortness of breath, or wet cough; or if you generally do not feel improved after treatment.

## 2016-04-14 NOTE — Progress Notes (Signed)
Pre visit review using our clinic review tool, if applicable. No additional management support is needed unless otherwise documented below in the visit note. 

## 2016-04-14 NOTE — Progress Notes (Signed)
Subjective:    Patient ID: Gerald [name], male    DOB: Jun 11, 1945, 72 y.o.   MRN: [id number]  HPI  [name] is a 71 y/o male who presents with cough, subjective fever, runny nose and fatigue since last Thursday. No history of COPD, asthma, PNA, Pt did get flu shot this year. Son is with him today with similar symptoms, and had a positive flu test today. He is eating and drinking well. Denies chest pain, SOB, chills, nausea, diarrhea.   Review of Systems  See HPI  Past Medical History:  Diagnosis Date  . Alcohol abuse    quit 1989  . Back pain, chronic   . Depression   . Diverticulosis   . Esophageal stricture   . Fatty liver   . GERD (gastroesophageal reflux disease)   . Hemorrhoids   . Hiatal hernia    EGD 3/10/3 stricture  . Hx of adenomatous colonic polyps   . Hyperlipidemia   . Hypertension   . Insomnia   . Melanoma (HCC)    right cheek  . 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 Onychomycosis   . OSA (obstructive sleep apnea)    mild, per sleep study  . PVD (peripheral vascular disease) (HCC)   . Sleep apnea    does not wear of c-pap     Social History   Social History  . Marital status: Married    Spouse name: N/A  . Number of children: 2  . Years of education: N/A   Occupational History  . Retired    Social History Main Topics  . Smoking status: Former Smoker    Quit date: 08/29/2006  . Smokeless tobacco: Never Used  . Alcohol use No     Comment: quit alcohol 1989  . Drug use: No  . Sexual activity: Not on file   Other Topics Concern  . Not on file   Social History Narrative   Diet: regular, lots of vegetables-----exercise : none but active   Caffeine use:  5 cups coffee daily          Past Surgical History:  Procedure Laterality Date  . COLONOSCOPY    . CYSTECTOMY     Right leg remotely, back 2012  . mass removal     left shoulder  . Melonoma removed from face    . TONSILLECTOMY AND  ADENOIDECTOMY    . UPPER GASTROINTESTINAL ENDOSCOPY    . WISDOM TOOTH EXTRACTION      Family History  Problem Relation Age of Onset  . Coronary artery disease Father   . Colon cancer Father   . Heart attack Paternal Grandfather   . Coronary artery disease Maternal Grandmother   . Colon cancer Maternal Grandmother   . Cancer Maternal Grandmother     colon  . Melanoma Mother   . Coronary artery disease Paternal Grandmother   . Heart attack Maternal Grandfather   . Hypertension Maternal Aunt   . Prostate cancer Neg Hx   . Stroke Neg Hx   . Esophageal cancer Neg Hx   . Pancreatic cancer Neg Hx   . Rectal cancer Neg Hx   . Stomach cancer Neg Hx     Allergies  Allergen Reactions  . Penicillins     Pt unsure of reaction    Current Outpatient Prescriptions on File Prior to Visit  Medication Sig Dispense Refill  . aspirin 81 MG tablet Take 81 mg  by mouth every other day.    . finasteride (PROSCAR) 5 MG tablet Take 5 mg by mouth daily.  11  . ketoconazole (NIZORAL) 2 % shampoo Apply topically as needed. For eczema    . losartan (COZAAR) 100 MG tablet TAKE 1 TABLET(100 MG) BY MOUTH DAILY 90 tablet 0  . MAGNESIUM PO Take 1,250 mg by mouth once a week.     . metoprolol tartrate (LOPRESSOR) 25 MG tablet TAKE 1 TABLET BY MOUTH TWICE DAILY 60 tablet 5  . Misc Natural Products (MENS PROSTATE HEALTH FORMULA PO) Take 1 tablet by mouth 3 (three) times daily.    . Multiple Vitamins-Minerals (MULTIVITAMIN ADULTS 50+ PO) Take 1 tablet by mouth daily.    . Omega-3 Fatty Acids (FISH OIL) 1200 MG CAPS Take 1 capsule by mouth daily.    Marland Kitchen omeprazole (PRILOSEC) 20 MG capsule Take 1 capsule (20 mg total) by mouth daily. 30 capsule 3  . tamsulosin (FLOMAX) 0.4 MG CAPS capsule Take 0.4 mg by mouth 2 (two) times daily.     No current facility-administered medications on file prior to visit.     BP (!) 126/53 (BP Location: Left Arm, Cuff Size: Large)   Pulse 62   Temp 98.6 F (37 C) (Oral)   Resp  18   Ht 5\' 5"  (1.651 m)   Wt 204 lb 6.4 oz (92.7 kg)   SpO2 99% Comment: room air  BMI 34.01 kg/m        Objective:   Physical Exam  Constitutional: He appears well-developed and well-nourished. He is cooperative.  HENT:  Head: Normocephalic and atraumatic.  Right Ear: Tympanic membrane, external ear and ear canal normal. Tympanic membrane is not erythematous, not retracted and not bulging.  Left Ear: Tympanic membrane, external ear and ear canal normal. Tympanic membrane is not erythematous, not retracted and not bulging.  Nose: Right sinus exhibits no maxillary sinus tenderness and no frontal sinus tenderness. Left sinus exhibits no maxillary sinus tenderness and no frontal sinus tenderness.  Mouth/Throat: Uvula is midline. Posterior oropharyngeal edema present. No posterior oropharyngeal erythema.  Cardiovascular: Normal rate, regular rhythm and normal heart sounds.   Pulmonary/Chest: Effort normal and breath sounds normal. No accessory muscle usage. No respiratory distress.  Neurological: He is alert.  Nursing note and vitals reviewed.   Results for orders placed or performed in visit on 04/14/16  POCT Influenza A  Result Value Ref Range   Rapid Influenza A Ag negative   POCT rapid strep A  Result Value Ref Range   Rapid Strep A Screen Negative Negative        Assessment & Plan:  1. Upper respiratory tract infection, unspecified type Both POCT Influenza A and rapid strep A tests negative. Exam benign. Son present with patient with positive flu, will provide prophylactic Tamiflu prescription. Patient declined prescription cough medicine at this time. He was advised to call us if he develops any worsening fever, shortness of breath, or wet cough; or if you generally do not feel improved after treatment.  Jarold Motto PA-C 04/14/16

## 2016-04-24 DIAGNOSIS — B353 Tinea pedis: Secondary | ICD-10-CM | POA: Diagnosis not present

## 2016-04-24 DIAGNOSIS — B351 Tinea unguium: Secondary | ICD-10-CM | POA: Diagnosis not present

## 2016-04-28 ENCOUNTER — Encounter: Payer: Self-pay | Admitting: Medical

## 2016-04-28 ENCOUNTER — Ambulatory Visit (INDEPENDENT_AMBULATORY_CARE_PROVIDER_SITE_OTHER): Payer: Medicare Other | Admitting: Medical

## 2016-04-28 VITALS — BP 140/72 | HR 56 | Temp 98.2°F | Resp 16 | Ht 65.0 in | Wt 206.0 lb

## 2016-04-28 DIAGNOSIS — J40 Bronchitis, not specified as acute or chronic: Secondary | ICD-10-CM

## 2016-04-28 DIAGNOSIS — R05 Cough: Secondary | ICD-10-CM | POA: Diagnosis not present

## 2016-04-28 DIAGNOSIS — R059 Cough, unspecified: Secondary | ICD-10-CM

## 2016-04-28 MED ORDER — AZITHROMYCIN 250 MG PO TABS: ORAL_TABLET | ORAL | 0 refills | Status: DC

## 2016-04-28 NOTE — Progress Notes (Signed)
Pre visit review using our clinic review tool, if applicable. No additional management support is needed unless otherwise documented below in the visit note/SLS  

## 2016-04-28 NOTE — Progress Notes (Signed)
Subjective:    Patient ID: Gerald [name], male    DOB: 1945-11-02, 71 y.o.   MRN: [id number]  HPI   Pt in states sick for about 2.5 weeks. Pt given tamiflu  But he is not getting better completely. He feels that he is about 50-70% better. No fever, no chills, no sweats. Pt state coughing up mucous is biggest symptom/sign and fatigue with minimal activity.  Pt is using delysm otc.   Pt was given preventative dose of tamiflu. Since pt had exposure to son who had the flu..  Review of Systems  Constitutional: Negative for chills, fatigue and fever.  HENT: Negative for congestion, ear pain, postnasal drip, sinus pain, sinus pressure, sore throat and tinnitus.   Respiratory: Positive for cough. Negative for shortness of breath and wheezing.   Cardiovascular: Negative for chest pain and palpitations.  Gastrointestinal: Negative for abdominal pain.  Musculoskeletal: Negative for back pain, myalgias, neck pain and neck stiffness.  Skin: Negative for rash.  Neurological: Negative for dizziness, seizures, syncope, weakness and headaches.  Hematological: Negative for adenopathy.  Psychiatric/Behavioral: Negative for agitation and confusion.    Past Medical History:  Diagnosis Date  . Alcohol abuse    quit 1989  . Back pain, chronic   . Depression   . Diverticulosis   . Esophageal stricture   . Fatty liver   . GERD (gastroesophageal reflux disease)   . Hemorrhoids   . Hiatal hernia    EGD 3/10/3 stricture  . Hx of adenomatous colonic polyps   . Hyperlipidemia   . Hypertension   . Insomnia   . Melanoma (HCC)    right cheek  . 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 Onychomycosis   . OSA (obstructive sleep apnea)    mild, per sleep study  . PVD (peripheral vascular disease) (HCC)   . Sleep apnea    does not wear of c-pap     Social History   Social History  . Marital status: Married    Spouse name: N/A  . Number of children: 2  . Years  of education: N/A   Occupational History  . Retired    Social History Main Topics  . Smoking status: Former Smoker    Quit date: 08/29/2006  . Smokeless tobacco: Never Used  . Alcohol use No     Comment: quit alcohol 1989  . Drug use: No  . Sexual activity: Not on file   Other Topics Concern  . Not on file   Social History Narrative   Diet: regular, lots of vegetables-----exercise : none but active   Caffeine use:  5 cups coffee daily          Past Surgical History:  Procedure Laterality Date  . COLONOSCOPY    . CYSTECTOMY     Right leg remotely, back 2012  . mass removal     left shoulder  . Melonoma removed from face    . TONSILLECTOMY AND ADENOIDECTOMY    . UPPER GASTROINTESTINAL ENDOSCOPY    . WISDOM TOOTH EXTRACTION      Family History  Problem Relation Age of Onset  . Coronary artery disease Father   . Colon cancer Father   . Heart attack Paternal Grandfather   . Coronary artery disease Maternal Grandmother   . Colon cancer Maternal Grandmother   . Cancer Maternal Grandmother     colon  . Melanoma Mother   . Coronary artery disease  Paternal Grandmother   . Heart attack Maternal Grandfather   . Hypertension Maternal Aunt   . Prostate cancer Neg Hx   . Stroke Neg Hx   . Esophageal cancer Neg Hx   . Pancreatic cancer Neg Hx   . Rectal cancer Neg Hx   . Stomach cancer Neg Hx     Allergies  Allergen Reactions  . Penicillins     Pt unsure of reaction    Current Outpatient Prescriptions on File Prior to Visit  Medication Sig Dispense Refill  . aspirin 81 MG tablet Take 81 mg by mouth every other day.    . finasteride (PROSCAR) 5 MG tablet Take 5 mg by mouth daily.  11  . ketoconazole (NIZORAL) 2 % shampoo Apply topically as needed. For eczema    . losartan (COZAAR) 100 MG tablet TAKE 1 TABLET(100 MG) BY MOUTH DAILY 90 tablet 0  . MAGNESIUM PO Take 1,250 mg by mouth once a week.     . metoprolol tartrate (LOPRESSOR) 25 MG tablet TAKE 1 TABLET BY  MOUTH TWICE DAILY 60 tablet 5  . Misc Natural Products (MENS PROSTATE HEALTH FORMULA PO) Take 1 tablet by mouth 3 (three) times daily.    . Multiple Vitamins-Minerals (MULTIVITAMIN ADULTS 50+ PO) Take 1 tablet by mouth daily.    . Omega-3 Fatty Acids (FISH OIL) 1200 MG CAPS Take 1 capsule by mouth daily.    Marland Kitchen omeprazole (PRILOSEC) 20 MG capsule Take 1 capsule (20 mg total) by mouth daily. 30 capsule 3  . tamsulosin (FLOMAX) 0.4 MG CAPS capsule Take 0.4 mg by mouth 2 (two) times daily.     No current facility-administered medications on file prior to visit.     BP 140/72 (BP Location: Left Arm, Patient Position: Sitting, Cuff Size: Large)   Pulse (!) 56   Temp 98.2 F (36.8 C) (Oral)   Resp 16   Ht 5\' 5"  (1.651 m)   Wt 206 lb (93.4 kg)   SpO2 97%   BMI 34.28 kg/m       Objective:   Physical Exam  General  Mental Status - Alert. General Appearance - Well groomed. Not in acute distress.  Skin Rashes- No Rashes.  HEENT Head- Normal. Ear Auditory Canal - Left- Normal. Right - Normal.Tympanic Membrane- Left- Normal. Right- Normal. Eye Sclera/Conjunctiva- Left- Normal. Right- Normal. Nose & Sinuses Nasal Mucosa- Left-  Boggy and Congested. Right-  Boggy and  Congested.Bilateral no  maxillary and  No frontal sinus pressure. Mouth & Throat Lips: Upper Lip- Normal: no dryness, cracking, pallor, cyanosis, or vesicular eruption. Lower Lip-Normal: no dryness, cracking, pallor, cyanosis or vesicular eruption. Buccal Mucosa- Bilateral- No Aphthous ulcers. Oropharynx- No Discharge or Erythema. Tonsils: Characteristics- Bilateral- No Erythema or Congestion. Size/Enlargement- Bilateral- No enlargement. Discharge- bilateral-None.  Neck Neck- Supple. No Masses.   Chest and Lung Exam Auscultation: Breath Sounds:-Clear even and unlabored.  Cardiovascular Auscultation:Rythm- Regular, rate and rhythm. Murmurs & Other Heart Sounds:Ausculatation of the heart reveal- No  Murmurs.  Lymphatic Head & Neck General Head & Neck Lymphatics: Bilateral: Description- No Localized lymphadenopathy.       Assessment & Plan:  You appear to have bronchitis. Rest hydrate and tylenol for fever. Use delsym for your cough, and azirhomycin antibiotic. For your nasal congestion use flonase  You should gradually get better. If not then notify us and would recommend a chest xray. Future xray will be placed.  Pt strep and flu test negative today  Follow up in 7-10  days or as needed

## 2016-04-28 NOTE — Patient Instructions (Signed)
You appear to have bronchitis. Rest hydrate and tylenol for fever. Use delsym for your cough, and azirhomycin antibiotic. For your nasal congestion use flonase  You should gradually get better. If not then notify us and would recommend a chest xray. Future xray will be placed.  Follow up in 7-10 days or as needed

## 2016-04-29 DIAGNOSIS — R972 Elevated prostate specific antigen [PSA]: Secondary | ICD-10-CM | POA: Diagnosis not present

## 2016-04-30 ENCOUNTER — Other Ambulatory Visit: Payer: Self-pay | Admitting: Family

## 2016-05-01 ENCOUNTER — Other Ambulatory Visit: Payer: Self-pay | Admitting: Family

## 2016-05-05 DIAGNOSIS — B351 Tinea unguium: Secondary | ICD-10-CM | POA: Diagnosis not present

## 2016-05-06 DIAGNOSIS — N400 Enlarged prostate without lower urinary tract symptoms: Secondary | ICD-10-CM | POA: Diagnosis not present

## 2016-05-06 DIAGNOSIS — R972 Elevated prostate specific antigen [PSA]: Secondary | ICD-10-CM | POA: Diagnosis not present

## 2016-05-25 ENCOUNTER — Other Ambulatory Visit: Payer: Self-pay | Admitting: Family

## 2016-05-29 DIAGNOSIS — R338 Other retention of urine: Secondary | ICD-10-CM | POA: Diagnosis not present

## 2016-05-29 DIAGNOSIS — R339 Retention of urine, unspecified: Secondary | ICD-10-CM | POA: Diagnosis not present

## 2016-06-05 DIAGNOSIS — R338 Other retention of urine: Secondary | ICD-10-CM | POA: Diagnosis not present

## 2016-06-06 ENCOUNTER — Inpatient Hospital Stay (HOSPITAL_BASED_OUTPATIENT_CLINIC_OR_DEPARTMENT_OTHER)
Admission: EM | Admit: 2016-06-06 | Discharge: 2016-06-09 | DRG: 698 | Disposition: A | Discharge status: Home / Self Care | Payer: Medicare Other | Attending: Internal Medicine | Admitting: Internal Medicine

## 2016-06-06 ENCOUNTER — Encounter (HOSPITAL_BASED_OUTPATIENT_CLINIC_OR_DEPARTMENT_OTHER): Payer: Self-pay

## 2016-06-06 ENCOUNTER — Emergency Department (HOSPITAL_BASED_OUTPATIENT_CLINIC_OR_DEPARTMENT_OTHER): Payer: Medicare Other

## 2016-06-06 DIAGNOSIS — G4700 Insomnia, unspecified: Secondary | ICD-10-CM | POA: Diagnosis not present

## 2016-06-06 DIAGNOSIS — Z8249 Family history of ischemic heart disease and other diseases of the circulatory system: Secondary | ICD-10-CM

## 2016-06-06 DIAGNOSIS — R079 Chest pain, unspecified: Secondary | ICD-10-CM | POA: Diagnosis not present

## 2016-06-06 DIAGNOSIS — G4733 Obstructive sleep apnea (adult) (pediatric): Secondary | ICD-10-CM | POA: Diagnosis present

## 2016-06-06 DIAGNOSIS — R338 Other retention of urine: Secondary | ICD-10-CM | POA: Diagnosis present

## 2016-06-06 DIAGNOSIS — K219 Gastro-esophageal reflux disease without esophagitis: Secondary | ICD-10-CM | POA: Diagnosis present

## 2016-06-06 DIAGNOSIS — R0602 Shortness of breath: Secondary | ICD-10-CM | POA: Diagnosis not present

## 2016-06-06 DIAGNOSIS — F329 Major depressive disorder, single episode, unspecified: Secondary | ICD-10-CM | POA: Diagnosis present

## 2016-06-06 DIAGNOSIS — Z79899 Other long term (current) drug therapy: Secondary | ICD-10-CM

## 2016-06-06 DIAGNOSIS — D6959 Other secondary thrombocytopenia: Secondary | ICD-10-CM | POA: Diagnosis present

## 2016-06-06 DIAGNOSIS — A4152 Sepsis due to Pseudomonas: Secondary | ICD-10-CM | POA: Diagnosis present

## 2016-06-06 DIAGNOSIS — K5900 Constipation, unspecified: Secondary | ICD-10-CM | POA: Diagnosis not present

## 2016-06-06 DIAGNOSIS — N401 Enlarged prostate with lower urinary tract symptoms: Secondary | ICD-10-CM | POA: Diagnosis present

## 2016-06-06 DIAGNOSIS — R339 Retention of urine, unspecified: Secondary | ICD-10-CM | POA: Diagnosis not present

## 2016-06-06 DIAGNOSIS — A419 Sepsis, unspecified organism: Secondary | ICD-10-CM | POA: Diagnosis present

## 2016-06-06 DIAGNOSIS — R6510 Systemic inflammatory response syndrome (SIRS) of non-infectious origin without acute organ dysfunction: Secondary | ICD-10-CM

## 2016-06-06 DIAGNOSIS — Y846 Urinary catheterization as the cause of abnormal reaction of the patient, or of later complication, without mention of misadventure at the time of the procedure: Secondary | ICD-10-CM | POA: Diagnosis present

## 2016-06-06 DIAGNOSIS — E860 Dehydration: Secondary | ICD-10-CM | POA: Diagnosis present

## 2016-06-06 DIAGNOSIS — Z6832 Body mass index (BMI) 32.0-32.9, adult: Secondary | ICD-10-CM

## 2016-06-06 DIAGNOSIS — K760 Fatty (change of) liver, not elsewhere classified: Secondary | ICD-10-CM | POA: Diagnosis present

## 2016-06-06 DIAGNOSIS — K210 Gastro-esophageal reflux disease with esophagitis: Secondary | ICD-10-CM | POA: Diagnosis not present

## 2016-06-06 DIAGNOSIS — R972 Elevated prostate specific antigen [PSA]: Secondary | ICD-10-CM | POA: Diagnosis not present

## 2016-06-06 DIAGNOSIS — M549 Dorsalgia, unspecified: Secondary | ICD-10-CM | POA: Diagnosis present

## 2016-06-06 DIAGNOSIS — R509 Fever, unspecified: Secondary | ICD-10-CM | POA: Diagnosis not present

## 2016-06-06 DIAGNOSIS — N3000 Acute cystitis without hematuria: Secondary | ICD-10-CM | POA: Diagnosis present

## 2016-06-06 DIAGNOSIS — Z87891 Personal history of nicotine dependence: Secondary | ICD-10-CM

## 2016-06-06 DIAGNOSIS — Z880 Allergy status to penicillin: Secondary | ICD-10-CM

## 2016-06-06 DIAGNOSIS — E785 Hyperlipidemia, unspecified: Secondary | ICD-10-CM | POA: Diagnosis present

## 2016-06-06 DIAGNOSIS — Z85820 Personal history of malignant melanoma of skin: Secondary | ICD-10-CM | POA: Diagnosis not present

## 2016-06-06 DIAGNOSIS — T83518A Infection and inflammatory reaction due to other urinary catheter, initial encounter: Secondary | ICD-10-CM | POA: Diagnosis not present

## 2016-06-06 DIAGNOSIS — I10 Essential (primary) hypertension: Secondary | ICD-10-CM | POA: Diagnosis not present

## 2016-06-06 DIAGNOSIS — E876 Hypokalemia: Secondary | ICD-10-CM

## 2016-06-06 DIAGNOSIS — G8929 Other chronic pain: Secondary | ICD-10-CM | POA: Diagnosis present

## 2016-06-06 DIAGNOSIS — E669 Obesity, unspecified: Secondary | ICD-10-CM | POA: Diagnosis present

## 2016-06-06 DIAGNOSIS — Z8619 Personal history of other infectious and parasitic diseases: Secondary | ICD-10-CM | POA: Insufficient documentation

## 2016-06-06 DIAGNOSIS — N390 Urinary tract infection, site not specified: Secondary | ICD-10-CM | POA: Diagnosis not present

## 2016-06-06 DIAGNOSIS — Z7982 Long term (current) use of aspirin: Secondary | ICD-10-CM

## 2016-06-06 DIAGNOSIS — Z86010 Personal history of colonic polyps: Secondary | ICD-10-CM

## 2016-06-06 DIAGNOSIS — I739 Peripheral vascular disease, unspecified: Secondary | ICD-10-CM | POA: Diagnosis present

## 2016-06-06 DIAGNOSIS — N3001 Acute cystitis with hematuria: Secondary | ICD-10-CM | POA: Diagnosis not present

## 2016-06-06 HISTORY — DX: Personal history of other infectious and parasitic diseases: Z86.19

## 2016-06-06 LAB — CBC WITH DIFFERENTIAL/PLATELET
BASOS ABS: 0 10*3/uL (ref 0.0–0.1)
Basophils Relative: 0 %
EOS ABS: 0 10*3/uL (ref 0.0–0.7)
Eosinophils Relative: 0 %
HCT: 44.1 % (ref 39.0–52.0)
Hemoglobin: 15.4 g/dL (ref 13.0–17.0)
LYMPHS ABS: 0.8 10*3/uL (ref 0.7–4.0)
Lymphocytes Relative: 6 %
MCH: 32.1 pg (ref 26.0–34.0)
MCHC: 34.9 g/dL (ref 30.0–36.0)
MCV: 91.9 fL (ref 78.0–100.0)
Monocytes Absolute: 0.7 10*3/uL (ref 0.1–1.0)
Monocytes Relative: 5 %
Neutro Abs: 12.4 10*3/uL — ABNORMAL HIGH (ref 1.7–7.7)
Neutrophils Relative %: 89 %
PLATELETS: 124 10*3/uL — AB (ref 150–400)
RBC: 4.8 MIL/uL (ref 4.22–5.81)
RDW: 12.4 % (ref 11.5–15.5)
WBC: 13.9 10*3/uL — AB (ref 4.0–10.5)

## 2016-06-06 LAB — COMPREHENSIVE METABOLIC PANEL
ALT: 44 U/L (ref 17–63)
AST: 39 U/L (ref 15–41)
Albumin: 4.1 g/dL (ref 3.5–5.0)
Alkaline Phosphatase: 84 U/L (ref 38–126)
Anion gap: 7 (ref 5–15)
BUN: 19 mg/dL (ref 6–20)
CHLORIDE: 104 mmol/L (ref 101–111)
CO2: 26 mmol/L (ref 22–32)
CREATININE: 1.09 mg/dL (ref 0.61–1.24)
Calcium: 9.2 mg/dL (ref 8.9–10.3)
Glucose, Bld: 103 mg/dL — ABNORMAL HIGH (ref 65–99)
POTASSIUM: 4 mmol/L (ref 3.5–5.1)
Sodium: 137 mmol/L (ref 135–145)
TOTAL PROTEIN: 7.3 g/dL (ref 6.5–8.1)
Total Bilirubin: 1 mg/dL (ref 0.3–1.2)

## 2016-06-06 LAB — URINALYSIS, ROUTINE W REFLEX MICROSCOPIC
Bilirubin Urine: NEGATIVE
Glucose, UA: NEGATIVE mg/dL
Ketones, ur: NEGATIVE mg/dL
NITRITE: POSITIVE — AB
PROTEIN: 30 mg/dL — AB
SPECIFIC GRAVITY, URINE: 1.014 (ref 1.005–1.030)
pH: 7.5 (ref 5.0–8.0)

## 2016-06-06 LAB — URINALYSIS, MICROSCOPIC (REFLEX)

## 2016-06-06 LAB — I-STAT CG4 LACTIC ACID, ED
LACTIC ACID, VENOUS: 2.28 mmol/L — AB (ref 0.5–1.9)
LACTIC ACID, VENOUS: 1.5 mmol/L (ref 0.5–1.9)

## 2016-06-06 LAB — MAGNESIUM: Magnesium: 1.4 mg/dL — ABNORMAL LOW (ref 1.7–2.4)

## 2016-06-06 MED ORDER — DEXTROSE 5 % IV SOLN
2.0000 g | Freq: Two times a day (BID) | INTRAVENOUS | Status: DC
  Administered 2016-06-06 – 2016-06-09 (×6): 2 g via INTRAVENOUS
  Filled 2016-06-06 (×7): qty 2

## 2016-06-06 MED ORDER — DEXTROSE 5 % IV SOLN
2.0000 g | Freq: Once | INTRAVENOUS | Status: AC
  Administered 2016-06-06: 2 g via INTRAVENOUS

## 2016-06-06 MED ORDER — METOPROLOL TARTRATE 25 MG PO TABS
25.0000 mg | ORAL_TABLET | Freq: Two times a day (BID) | ORAL | Status: DC
  Administered 2016-06-06 – 2016-06-09 (×7): 25 mg via ORAL
  Filled 2016-06-06 (×7): qty 1

## 2016-06-06 MED ORDER — ACETAMINOPHEN 650 MG RE SUPP: 650.0000 mg | Freq: Four times a day (QID) | RECTAL | Status: DC | PRN

## 2016-06-06 MED ORDER — FINASTERIDE 5 MG PO TABS
5.0000 mg | ORAL_TABLET | Freq: Every day | ORAL | Status: DC
  Administered 2016-06-06 – 2016-06-08 (×3): 5 mg via ORAL
  Filled 2016-06-06 (×4): qty 1

## 2016-06-06 MED ORDER — TAMSULOSIN HCL 0.4 MG PO CAPS
0.4000 mg | ORAL_CAPSULE | Freq: Two times a day (BID) | ORAL | Status: DC
  Administered 2016-06-06 – 2016-06-09 (×6): 0.4 mg via ORAL
  Filled 2016-06-06 (×6): qty 1

## 2016-06-06 MED ORDER — SODIUM CHLORIDE 0.9 % IV BOLUS (SEPSIS)
1000.0000 mL | Freq: Once | INTRAVENOUS | Status: AC
  Administered 2016-06-06: 1000 mL via INTRAVENOUS

## 2016-06-06 MED ORDER — ALBUTEROL SULFATE (2.5 MG/3ML) 0.083% IN NEBU: 2.5000 mg | INHALATION_SOLUTION | RESPIRATORY_TRACT | Status: DC | PRN

## 2016-06-06 MED ORDER — ACETAMINOPHEN 500 MG PO TABS
1000.0000 mg | ORAL_TABLET | Freq: Once | ORAL | Status: AC
  Administered 2016-06-06: 1000 mg via ORAL
  Filled 2016-06-06: qty 2

## 2016-06-06 MED ORDER — SODIUM CHLORIDE 0.9% FLUSH
3.0000 mL | Freq: Two times a day (BID) | INTRAVENOUS | Status: DC
  Administered 2016-06-06 – 2016-06-08 (×5): 3 mL via INTRAVENOUS

## 2016-06-06 MED ORDER — SENNOSIDES-DOCUSATE SODIUM 8.6-50 MG PO TABS: 1.0000 | ORAL_TABLET | Freq: Every evening | ORAL | Status: DC | PRN

## 2016-06-06 MED ORDER — LOSARTAN POTASSIUM 50 MG PO TABS
100.0000 mg | ORAL_TABLET | Freq: Every day | ORAL | Status: DC
  Administered 2016-06-06 – 2016-06-09 (×4): 100 mg via ORAL
  Filled 2016-06-06 (×4): qty 2

## 2016-06-06 MED ORDER — PANTOPRAZOLE SODIUM 40 MG PO TBEC
40.0000 mg | DELAYED_RELEASE_TABLET | Freq: Every day | ORAL | Status: DC
  Administered 2016-06-06 – 2016-06-09 (×4): 40 mg via ORAL
  Filled 2016-06-06 (×4): qty 1

## 2016-06-06 MED ORDER — CEFEPIME HCL 2 G IJ SOLR
INTRAMUSCULAR | Status: AC
  Filled 2016-06-06: qty 2

## 2016-06-06 MED ORDER — SODIUM CHLORIDE 0.9 % IV SOLN
INTRAVENOUS | Status: AC
  Administered 2016-06-06: 14:00:00 via INTRAVENOUS

## 2016-06-06 MED ORDER — ENOXAPARIN SODIUM 40 MG/0.4ML ~~LOC~~ SOLN
40.0000 mg | SUBCUTANEOUS | Status: DC
  Administered 2016-06-06 – 2016-06-08 (×3): 40 mg via SUBCUTANEOUS
  Filled 2016-06-06 (×3): qty 0.4

## 2016-06-06 MED ORDER — ASPIRIN EC 81 MG PO TBEC
81.0000 mg | DELAYED_RELEASE_TABLET | ORAL | Status: DC
  Administered 2016-06-07 – 2016-06-09 (×2): 81 mg via ORAL
  Filled 2016-06-06 (×3): qty 1

## 2016-06-06 MED ORDER — ACETAMINOPHEN 325 MG PO TABS
650.0000 mg | ORAL_TABLET | Freq: Four times a day (QID) | ORAL | Status: DC | PRN
  Administered 2016-06-06 – 2016-06-09 (×5): 650 mg via ORAL
  Filled 2016-06-06 (×5): qty 2

## 2016-06-06 NOTE — ED Notes (Signed)
Report called to Paula RN with [facility]. 

## 2016-06-06 NOTE — ED Notes (Signed)
[facility] here to transport pt to [facility]. Care turned over to [facility] staff.

## 2016-06-06 NOTE — ED Provider Notes (Signed)
[facility] DEPT [facility] Provider Note   CSN: [id number] Arrival date & time: 06/06/16  0347     History   Chief Complaint Chief Complaint  Patient presents with  . Fever    HPI Gerald [name] is a 71 y.o. male.  HPI Pt started having trouble with a fever last night.   He has been having difficulty urinating.   He has been urinating small amounts but does not feel that he urinated properly.  No coughing.  No abdominal pain.  No vomiting or diarrhea.  This morning he started to feel weak and dehydrated.  He does have a history of urinary retention and had a catheter in place for the last week until yesterday when it was removed.   Past Medical History:  Diagnosis Date  . Alcohol abuse    quit 1989  . Back pain, chronic   . Depression   . Diverticulosis   . Esophageal stricture   . Fatty liver   . GERD (gastroesophageal reflux disease)   . Hemorrhoids   . Hiatal hernia    EGD 3/10/3 stricture  . Hx of adenomatous colonic polyps   . Hyperlipidemia   . Hypertension   . Insomnia   . Melanoma ([place])    right cheek  . 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 Onychomycosis   . OSA (obstructive sleep apnea)    mild, per sleep study  . PVD (peripheral vascular disease) ([place])   . Sleep apnea    does not wear of c-pap    Patient Active Problem List   Diagnosis Date Noted  . Melanoma of skin ([place]) 09/26/2015  . Mild obesity 06/05/2015  . Hypertensive left ventricular hypertrophy, without heart failure 06/05/2015  . 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  . HTN (hypertension) 09/01/2011  . Elevated PSA 05/07/2011  . GERD (gastroesophageal reflux disease) 01/14/2011  . Family history of colon cancer 01/14/2011  . History of colon polyps 01/14/2011  . Fatty liver disease, nonalcoholic 42/59/5638  . ESOPHAGEAL STRICTURE 11/27/2008  . COLONIC POLYPS, ADENOMATOUS, HX OF 11/27/2008  . ALCOHOL ABUSE, HX OF 09/16/2007  . DIVERTICULOSIS, COLON  07/16/2007  . BACK PAIN, CHRONIC 07/16/2007  . Hyperlipidemia 08/10/2006    Past Surgical History:  Procedure Laterality Date  . COLONOSCOPY    . CYSTECTOMY     Right leg remotely, back 2012  . mass removal     left shoulder  . Melonoma removed from face    . TONSILLECTOMY AND ADENOIDECTOMY    . UPPER GASTROINTESTINAL ENDOSCOPY    . WISDOM TOOTH EXTRACTION         Home Medications    Prior to Admission medications   Medication Sig Start Date End Date Taking? Authorizing Provider  aspirin 81 MG tablet Take 81 mg by mouth every other day.    Historical Provider, MD  azithromycin (ZITHROMAX) 250 MG tablet Take 2 tablets by mouth on day 1, followed by 1 tablet by mouth daily for 4 days. 04/28/16   Percell Miller Saguier, PA-C  finasteride (PROSCAR) 5 MG tablet Take 5 mg by mouth daily. 09/03/15   Historical Provider, MD  ketoconazole (NIZORAL) 2 % shampoo Apply topically as needed. For eczema 11/01/11   Historical Provider, MD  losartan (COZAAR) 100 MG tablet TAKE 1 TABLET(100 MG) BY MOUTH DAILY 04/30/16   Debbrah Alar, NP  MAGNESIUM PO Take 1,250 mg by mouth once a week.     Historical Provider, MD  metoprolol tartrate (LOPRESSOR) 25 MG tablet TAKE 1 TABLET BY MOUTH TWICE DAILY 05/26/16   Debbrah Alar, NP  Misc Natural Products ([place]) Take 1 tablet by mouth 3 (three) times daily.    Historical Provider, MD  Multiple Vitamins-Minerals (MULTIVITAMIN ADULTS 50+ PO) Take 1 tablet by mouth daily.    Historical Provider, MD  Omega-3 Fatty Acids (FISH OIL) 1200 MG CAPS Take 1 capsule by mouth daily.    Historical Provider, MD  omeprazole (PRILOSEC) 20 MG capsule Take 1 capsule (20 mg total) by mouth daily. 09/18/14   Debbrah Alar, NP  tamsulosin (FLOMAX) 0.4 MG CAPS capsule Take 0.4 mg by mouth 2 (two) times daily.    Historical Provider, MD    Family History Family History  Problem Relation Age of Onset  . Coronary artery disease Father   . Colon cancer  Father   . Heart attack Paternal Grandfather   . Coronary artery disease Maternal Grandmother   . Colon cancer Maternal Grandmother   . Cancer Maternal Grandmother     colon  . Melanoma Mother   . Coronary artery disease Paternal Grandmother   . Heart attack Maternal Grandfather   . Hypertension Maternal Aunt   . Prostate cancer Neg Hx   . Stroke Neg Hx   . Esophageal cancer Neg Hx   . Pancreatic cancer Neg Hx   . Rectal cancer Neg Hx   . Stomach cancer Neg Hx     Social History Social History  Substance Use Topics  . Smoking status: Former Smoker    Quit date: 08/29/2006  . Smokeless tobacco: Never Used  . Alcohol use No     Comment: quit alcohol 1989     Allergies   Penicillins   Review of Systems Review of Systems  Constitutional: Positive for fever.  Respiratory: Positive for chest tightness. Negative for cough.   Cardiovascular: Negative for leg swelling.  Genitourinary: Positive for dysuria.  Skin: Negative for rash.  All other systems reviewed and are negative.    Physical Exam Updated Vital Signs BP 147/74   Pulse 116   Temp 102.1 F (38.9 C) (Oral)   Resp 23   Ht 5\' 6"  (1.676 m)   Wt 93 kg   SpO2 95%   BMI 33.09 kg/m   Physical Exam  Constitutional: No distress.  HENT:  Head: Normocephalic and atraumatic.  Right Ear: External ear normal.  Left Ear: External ear normal.  Eyes: Conjunctivae are normal. Right eye exhibits no discharge. Left eye exhibits no discharge. No scleral icterus.  Neck: Neck supple. No tracheal deviation present.  Cardiovascular: Regular rhythm and intact distal pulses.  Tachycardia present.   Pulmonary/Chest: Effort normal and breath sounds normal. No stridor. No respiratory distress. He has no wheezes. He has no rales.  Abdominal: Soft. Bowel sounds are normal. He exhibits no distension. There is tenderness in the suprapubic area. There is no rebound and no guarding.  Musculoskeletal: He exhibits no edema or  tenderness.  Neurological: He is alert. He has normal strength. No cranial nerve deficit (no facial droop, extraocular movements intact, no slurred speech) or sensory deficit. He exhibits normal muscle tone. He displays no seizure activity. Coordination normal.  Skin: Skin is warm and dry. No rash noted. He is not diaphoretic.  Psychiatric: He has a normal mood and affect.  Nursing note and vitals reviewed.    ED Treatments / Results  Labs (all labs ordered are listed, but only abnormal results  are displayed) Labs Reviewed  COMPREHENSIVE METABOLIC PANEL - Abnormal; Notable for the following:       Result Value   Glucose, Bld 103 (*)    All other components within normal limits  CBC WITH DIFFERENTIAL/PLATELET - Abnormal; Notable for the following:    WBC 13.9 (*)    Platelets 124 (*)    Neutro Abs 12.4 (*)    All other components within normal limits  URINALYSIS, ROUTINE W REFLEX MICROSCOPIC - Abnormal; Notable for the following:    APPearance CLOUDY (*)    Hgb urine dipstick TRACE (*)    Protein, ur 30 (*)    Nitrite POSITIVE (*)    Leukocytes, UA MODERATE (*)    All other components within normal limits  URINALYSIS, MICROSCOPIC (REFLEX) - Abnormal; Notable for the following:    Bacteria, UA MANY (*)    Squamous Epithelial / LPF 0-5 (*)    All other components within normal limits  I-STAT CG4 LACTIC ACID, ED - Abnormal; Notable for the following:    Lactic Acid, Venous 2.28 (*)    All other components within normal limits  CULTURE, BLOOD (ROUTINE X 2)  CULTURE, BLOOD (ROUTINE X 2)  URINE CULTURE  LACTIC ACID, PLASMA    EKG  EKG Interpretation  Date/Time:  Friday June 06 2016 06:51:30 EST Ventricular Rate:  127 PR Interval:    QRS Duration: 83 QT Interval:  295 QTC Calculation: 429 R Axis:   -70 Text Interpretation:  Sinus tachycardia Abnormal R-wave progression, late transition Confirmed by [facility]  MD, APRIL ([id number]) on 06/06/2016 6:58:14 AM        Radiology Dg Chest 2 View  Result Date: 06/06/2016 CLINICAL DATA:  Chest pain and shortness of breath since early this morning. EXAM: CHEST  2 VIEW COMPARISON:  05/10/2012 FINDINGS: The cardiac silhouette, mediastinal and hilar contours are within normal limits and stable. The lungs are clear. No pleural effusion. The bony thorax is intact. IMPRESSION: No acute cardiopulmonary findings. Electronically Signed   By: Marijo Sanes M.D.   On: 06/06/2016 08:01    Procedures Procedures (including critical care time)  Medications Ordered in ED Medications  ceFEPIme (MAXIPIME) 2 g injection (  Not Given 06/06/16 0819)  acetaminophen (TYLENOL) tablet 1,000 mg (1,000 mg Oral Given 06/06/16 0721)  sodium chloride 0.9 % bolus 1,000 mL (0 mLs Intravenous Stopped 06/06/16 0840)    And  sodium chloride 0.9 % bolus 1,000 mL (0 mLs Intravenous Stopped 06/06/16 0920)    And  sodium chloride 0.9 % bolus 1,000 mL (1,000 mLs Intravenous New Bag/Given 06/06/16 0845)  ceFEPIme (MAXIPIME) 2 g in dextrose 5 % 50 mL IVPB (0 g Intravenous Stopped 06/06/16 0845)     Initial Impression / Assessment and Plan / ED Course  I have reviewed the triage vital signs and the nursing notes.  Pertinent labs & imaging results that were available during my care of the patient were reviewed by me and considered in my medical decision making (see chart for details).  Clinical Course as of Jun 07 934  Fri Jun 06, 2016  0935 Patient remains febrile. Heart rate slightly decreased. Blood pressure remains stable  [JK]    Clinical Course User Index [JK] Dorie Rank, MD  patient presented to the emergency room with fever and urinary symptoms. Patient does have an elevated white blood cell count and evidence for urinary tract infection. Lactic acid levels also elevated although at this point without hypotension lactic acid level greater  than 4 doubt severe sepsis. I am concerned however about systemic infection, his leukocytosis and likely  elevated lactic acid level.  Patient was started on cefepime.  Cultures have been sent off for analysis. I will consult with the hospitalist service to arrange for transfer and admission.  Final Clinical Impressions(s) / ED Diagnoses   Final diagnoses:  Acute cystitis without hematuria  SIRS (systemic inflammatory response syndrome) (HCC)      Dorie Rank, MD 06/06/16 [phone]

## 2016-06-06 NOTE — H&P (Signed)
History and Physical    Gerald [name] AVW:[id number] DOB: 12/24/1945 DOA: 06/06/2016  PCP: Lemont Fillers., NP   I have briefly reviewed patients previous medical reports in [facility].  Patient coming from: Home  Chief Complaint: Fever, chills, rigors and difficulty urinating.  HPI: Gerald [name] is a 71 year old male, married, lives with spouse and independent of activities of daily living, PMH of BPH, acute urinary retention status post Foley catheter 1 week, removed by urology in office on 06/05/16, elevated PSA and awaiting prostate biopsy in 2 weeks, GERD, HLD, HTN, mild OSA not on CPAP, former smoker, presented to [facility] on 06/16/16 with complaints of high fever, chills, rigors and difficulty urinating. Patient states that he was in his usual state of health after he returned home from urologist's office where Foley catheter was removed yesterday. He was drinking adequate amount of liquids as per instructions and urinating well. At approximately 1 AM on day of admission, he started experiencing high fevers up to 103F, chills, rigors and difficulty urinating (poor stream, dribbling of urine, incomplete emptying) and dysuria. He had transient episode of epigastric discomfort, rated at 5/10 in severity, nonradiating, worse on sitting up and better on lying on his stomach which resolved after he burped a couple of times. He gives a chronic history of similar GI symptoms. He denies dyspnea, chest pain, cough.  ED Course: Met sepsis criteria in ED including fever of 103.1, tachycardia, leukocytosis and positive urine microscopy. He was treated per sepsis protocol with IV fluids and started on IV cefepime. PVR in the ED was about 240 mL. Transferred to [facility] telemetry bed.  Review of Systems:  All other systems reviewed and apart from HPI, are negative.  Past Medical History:  Diagnosis Date  . Alcohol abuse    quit 1989  . Back pain, chronic   .  Depression   . Diverticulosis   . Esophageal stricture   . Fatty liver   . GERD (gastroesophageal reflux disease)   . Hemorrhoids   . Hiatal hernia    EGD 3/10/3 stricture  . Hx of adenomatous colonic polyps   . Hyperlipidemia   . Hypertension   . Insomnia   . Melanoma (HCC)    right cheek  . 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 Onychomycosis   . OSA (obstructive sleep apnea)    mild, per sleep study  . PVD (peripheral vascular disease) (HCC)   . Sleep apnea    does not wear of c-pap    Past Surgical History:  Procedure Laterality Date  . COLONOSCOPY    . CYSTECTOMY     Right leg remotely, back 2012  . mass removal     left shoulder  . Melonoma removed from face    . TONSILLECTOMY AND ADENOIDECTOMY    . UPPER GASTROINTESTINAL ENDOSCOPY    . WISDOM TOOTH EXTRACTION      Social History  reports that he quit smoking about 9 years ago. He has never used smokeless tobacco. He reports that he does not drink alcohol or use drugs.  Allergies  Allergen Reactions  . Penicillins     Pt unsure of reaction    Family History  Problem Relation Age of Onset  . Coronary artery disease Father   . Colon cancer Father   . Heart attack Paternal Grandfather   . Coronary artery disease Maternal Grandmother   . Colon cancer Maternal  Grandmother   . Cancer Maternal Grandmother     colon  . Melanoma Mother   . Coronary artery disease Paternal Grandmother   . Heart attack Maternal Grandfather   . Hypertension Maternal Aunt   . Prostate cancer Neg Hx   . Stroke Neg Hx   . Esophageal cancer Neg Hx   . Pancreatic cancer Neg Hx   . Rectal cancer Neg Hx   . Stomach cancer Neg Hx      Prior to Admission medications   Medication Sig Start Date End Date Taking? Authorizing Provider  aspirin 81 MG tablet Take 81 mg by mouth every other day.    Historical Provider, MD  azithromycin (ZITHROMAX) 250 MG tablet Take 2 tablets by mouth on day 1, followed  by 1 tablet by mouth daily for 4 days. 04/28/16   Ramon Dredge Saguier, PA-C  finasteride (PROSCAR) 5 MG tablet Take 5 mg by mouth daily. 09/03/15   Historical Provider, MD  ketoconazole (NIZORAL) 2 % shampoo Apply topically as needed. For eczema 11/01/11   Historical Provider, MD  losartan (COZAAR) 100 MG tablet TAKE 1 TABLET(100 MG) BY MOUTH DAILY 04/30/16   Sandford Craze, NP  MAGNESIUM PO Take 1,250 mg by mouth once a week.     Historical Provider, MD  metoprolol tartrate (LOPRESSOR) 25 MG tablet TAKE 1 TABLET BY MOUTH TWICE DAILY 05/26/16   Sandford Craze, NP  Misc Natural Products (MENS PROSTATE HEALTH FORMULA PO) Take 1 tablet by mouth 3 (three) times daily.    Historical Provider, MD  Multiple Vitamins-Minerals (MULTIVITAMIN ADULTS 50+ PO) Take 1 tablet by mouth daily.    Historical Provider, MD  Omega-3 Fatty Acids (FISH OIL) 1200 MG CAPS Take 1 capsule by mouth daily.    Historical Provider, MD  omeprazole (PRILOSEC) 20 MG capsule Take 1 capsule (20 mg total) by mouth daily. 09/18/14   Sandford Craze, NP  tamsulosin (FLOMAX) 0.4 MG CAPS capsule Take 0.4 mg by mouth 2 (two) times daily.    Historical Provider, MD    Physical Exam: Vitals:   06/06/16 0945 06/06/16 1000 06/06/16 1030 06/06/16 1143  BP: 145/79 155/88 131/73 129/84  Pulse: (!) 122 117 114 (!) 107  Resp: 20 21 23 20   Temp:    99.2 F (37.3 C)  TempSrc:    Oral  SpO2: 96% 97% 97% 97%  Weight:    90.6 kg (199 lb 11.8 oz)  Height:    5\' 6"  (1.676 m)      Constitutional: Pleasant middle-aged male, moderately built and overweight, lying comfortably propped up in bed without distress. Eyes: PERTLA, lids and conjunctivae normal ENMT: Mucous membranes are moist. Posterior pharynx clear of any exudate or lesions. Normal dentition.  Neck: supple, no masses, no thyromegaly Respiratory: clear to auscultation bilaterally, no wheezing, no crackles. Normal respiratory effort. No accessory muscle use.  Cardiovascular: S1 & S2  heard, regular rate and rhythm, no murmurs / rubs / gallops. No extremity edema. 2+ pedal pulses. No carotid bruits.  Abdomen: No distension but obese, no tenderness. Bladder was palpable up to the level of the umbilicus prior to Foley catheter placement. No hepatosplenomegaly. Bowel sounds normal.  Musculoskeletal: no clubbing / cyanosis. No joint deformity upper and lower extremities. Good ROM, no contractures. Normal muscle tone.  Skin: no rashes, lesions, ulcers. No induration Neurologic: CN 2-12 grossly intact. Sensation intact, DTR normal. Strength 5/5 in all 4 limbs.  Psychiatric: Normal judgment and insight. Alert and oriented x 3. Normal mood.  Labs on Admission: I have personally reviewed following labs and imaging studies  CBC:  Recent Labs Lab 06/06/16 0751  WBC 13.9*  NEUTROABS 12.4*  HGB 15.4  HCT 44.1  MCV 91.9  PLT 124*   Basic Metabolic Panel:  Recent Labs Lab 06/06/16 0751  NA 137  K 4.0  CL 104  CO2 26  GLUCOSE 103*  BUN 19  CREATININE 1.09  CALCIUM 9.2   Liver Function Tests:  Recent Labs Lab 06/06/16 0751  AST 39  ALT 44  ALKPHOS 84  BILITOT 1.0  PROT 7.3  ALBUMIN 4.1   Urine analysis:    Component Value Date/Time   COLORURINE YELLOW 06/06/2016 0751   APPEARANCEUR CLOUDY (A) 06/06/2016 0751   LABSPEC 1.014 06/06/2016 0751   PHURINE 7.5 06/06/2016 0751   GLUCOSEU NEGATIVE 06/06/2016 0751   HGBUR TRACE (A) 06/06/2016 0751   BILIRUBINUR NEGATIVE 06/06/2016 0751   BILIRUBINUR neg 08/29/2010   KETONESUR NEGATIVE 06/06/2016 0751   PROTEINUR 30 (A) 06/06/2016 0751   UROBILINOGEN 0.2 09/01/2011 0946   NITRITE POSITIVE (A) 06/06/2016 0751   LEUKOCYTESUR MODERATE (A) 06/06/2016 0751     Radiological Exams on Admission: Dg Chest 2 View  Result Date: 06/06/2016 CLINICAL DATA:  Chest pain and shortness of breath since early this morning. EXAM: CHEST  2 VIEW COMPARISON:  05/10/2012 FINDINGS: The cardiac silhouette, mediastinal and hilar  contours are within normal limits and stable. The lungs are clear. No pleural effusion. The bony thorax is intact. IMPRESSION: No acute cardiopulmonary findings. Electronically Signed   By: Rudie Meyer M.D.   On: 06/06/2016 08:01    EKG: Independently reviewed. Sinus tachycardia at 1 27 bpm, LAD, no acute changes. Q waves in inferior leads and leads V1-3. QTC 429 ms.  Assessment/Plan Principal Problem:   Sepsis secondary to UTI [facility]) Active Problems:   Hyperlipidemia   GERD (gastroesophageal reflux disease)   Elevated PSA   HTN (hypertension)     1. Sepsis secondary to complicated UTI/CAUTI: Foley catheter was removed the day prior to discharge. Met sepsis criteria on admission and treated per sepsis protocol with aggressive IV fluids and started empirically on IV cefepime. Sepsis physiology improved. Continue gentle IV fluids and IV cefepime pending culture sensitivity results. Lactate elevated initially at 2.28 has normalized. 2. Acute urinary retention secondary to BPH: Followed by outpatient Urology (Dr. Sherryl Barters @ [facility]). Had a Foley catheter for a week prior to admission which was removed day prior to admission 06/05/16. Again having acute urinary retention with bladder scan showing >600 mL urine. Foley catheter placed with instant relief & 600 mL urine drained right away. Will have to DC again with Foley catheter until outpatient follow-up with urology. Resume home medications. 3. Mild thrombocytopenia: Probably related to sepsis. Follow CBC in a.m. 4. Essential hypertension: Reasonably controlled. Resume home antihypertensives after pharmacy has reconciled home medications. 5. GERD: PPI. His epigastric symptoms relieved after burping suspicious for GERD. 6. Hyperlipidemia: Continue statins 7. Elevated PSA: Supposed to have prostate biopsy in the next couple of weeks by urology. 8. Obesity/Body mass index is 32.24 kg/m.    DVT prophylaxis: Lovenox  Code Status: Full    Family Communication: Discussed in detail with patient's spouse and son at bedside.  Disposition Plan: DC home when medically improved and stable, possibly 06/08/16.  Consults called: None  Admission status: Inpatient, telemetry.    [facility] MD [facility] Pager [phone]  If 7PM-7AM, please contact night-coverage [email or web address] Password [email or web address]  06/06/2016, 12:33 PM

## 2016-06-06 NOTE — ED Notes (Signed)
Report called to [facility] 4th floor. Juliette Mangle.

## 2016-06-06 NOTE — ED Notes (Signed)
Pt on cardiac monitor and automatic VS 

## 2016-06-06 NOTE — Progress Notes (Signed)
Pharmacy Antibiotic Note  Gerald [name] is a 71 y.o. male admitted on 06/06/2016 with sepsis d/t complicated UTI. Hx urinary retention and has had catheter in place x 1 week. New fevers overnight and went to [facility]; now transferred to [facility]. Pharmacy has been consulted for Cefepime dosing.  Plan: Cefepime 2 g IV q12 hr  Height: 5\' 6"  (167.6 cm) Weight: 199 lb 11.8 oz (90.6 kg) IBW/kg (Calculated) : 63.8  Temp (24hrs), Avg:101 F (38.3 C), Min:99.2 F (37.3 C), Max:103.1 F (39.5 C)   Recent Labs Lab 06/06/16 0713 06/06/16 0751 06/06/16 1048  WBC  --  13.9*  --   CREATININE  --  1.09  --   LATICACIDVEN 2.28*  --  1.50    Estimated Creatinine Clearance: 67.4 mL/min (by C-G formula based on SCr of 1.09 mg/dL).    Allergies  Allergen Reactions  . Penicillins     Pt unsure of reaction     Thank you for allowing pharmacy to be a part of this patient's care.  Reuel Boom, PharmD, BCPS Pager: [phone] 06/06/2016, 12:42 PM

## 2016-06-06 NOTE — ED Notes (Signed)
Dr Tomi Bamberger made aware of temp. No further orders.

## 2016-06-06 NOTE — ED Triage Notes (Signed)
Pt c/o central chest pain and SOB since 0430 this morning.  Pt also belching a lot in triage, c/o chills and states he has had some urinary retention since last night as well.

## 2016-06-06 NOTE — ED Notes (Signed)
Pt taken to and from [facility] via [facility]

## 2016-06-07 ENCOUNTER — Encounter (HOSPITAL_COMMUNITY): Payer: Self-pay

## 2016-06-07 DIAGNOSIS — A419 Sepsis, unspecified organism: Secondary | ICD-10-CM

## 2016-06-07 DIAGNOSIS — K210 Gastro-esophageal reflux disease with esophagitis: Secondary | ICD-10-CM

## 2016-06-07 DIAGNOSIS — G4700 Insomnia, unspecified: Secondary | ICD-10-CM

## 2016-06-07 DIAGNOSIS — I10 Essential (primary) hypertension: Secondary | ICD-10-CM

## 2016-06-07 DIAGNOSIS — N390 Urinary tract infection, site not specified: Secondary | ICD-10-CM

## 2016-06-07 DIAGNOSIS — R339 Retention of urine, unspecified: Secondary | ICD-10-CM

## 2016-06-07 DIAGNOSIS — R972 Elevated prostate specific antigen [PSA]: Secondary | ICD-10-CM

## 2016-06-07 DIAGNOSIS — E785 Hyperlipidemia, unspecified: Secondary | ICD-10-CM

## 2016-06-07 LAB — BASIC METABOLIC PANEL
ANION GAP: 7 (ref 5–15)
BUN: 16 mg/dL (ref 6–20)
CO2: 23 mmol/L (ref 22–32)
Calcium: 8.5 mg/dL — ABNORMAL LOW (ref 8.9–10.3)
Chloride: 107 mmol/L (ref 101–111)
Creatinine, Ser: 1.03 mg/dL (ref 0.61–1.24)
GFR calc Af Amer: >60 mL/min (ref >60)
Glucose, Bld: 95 mg/dL (ref 65–99)
POTASSIUM: 3.6 mmol/L (ref 3.5–5.1)
SODIUM: 137 mmol/L (ref 135–145)

## 2016-06-07 LAB — CBC
HEMATOCRIT: 38.4 % — AB (ref 39.0–52.0)
HEMOGLOBIN: 13.3 g/dL (ref 13.0–17.0)
MCH: 31.6 pg (ref 26.0–34.0)
MCHC: 34.6 g/dL (ref 30.0–36.0)
MCV: 91.2 fL (ref 78.0–100.0)
Platelets: 126 10*3/uL — ABNORMAL LOW (ref 150–400)
RBC: 4.21 MIL/uL — ABNORMAL LOW (ref 4.22–5.81)
RDW: 13.3 % (ref 11.5–15.5)
WBC: 23.5 10*3/uL — ABNORMAL HIGH (ref 4.0–10.5)

## 2016-06-07 MED ORDER — DIPHENHYDRAMINE HCL 50 MG/ML IJ SOLN
50.0000 mg | Freq: Once | INTRAMUSCULAR | Status: AC
Start: 2016-06-07 — End: 2016-06-07
  Administered 2016-06-07: 50 mg via INTRAVENOUS
  Filled 2016-06-07: qty 1

## 2016-06-07 MED ORDER — GI COCKTAIL ~~LOC~~: 30.0000 mL | Freq: Three times a day (TID) | ORAL | Status: DC | PRN

## 2016-06-07 MED ORDER — ZOLPIDEM TARTRATE 5 MG PO TABS
5.0000 mg | ORAL_TABLET | Freq: Every evening | ORAL | Status: DC | PRN
  Administered 2016-06-07 – 2016-06-08 (×2): 5 mg via ORAL
  Filled 2016-06-07 (×2): qty 1

## 2016-06-07 NOTE — Progress Notes (Addendum)
[facility] PROGRESS NOTE  Gerald [name] NUU:[id number] DOB: 04/01/1945 DOA: 06/06/2016 PCP: Lemont Fillers., NP   Interim summary and HPI 71 year old male, married, lives with spouse and independent of activities of daily living, PMH of BPH, acute urinary retention status post Foley catheter 1 week, removed by urology in office on 06/05/16, elevated PSA and awaiting prostate biopsy in 2 weeks, GERD, HLD, HTN, mild OSA not on CPAP, former smoker, presented to [facility] [place] on 06/16/16 with complaints of high fever, chills, rigors and difficulty urinating. Patient states that he was in his usual state of health after he returned home from urologist's office where Foley catheter was removed yesterday. He was drinking adequate amount of liquids as per instructions and urinating well. At approximately 1 AM on day of admission, he started experiencing high fevers up to 103F, chills, rigors and difficulty urinating (poor stream, dribbling of urine, incomplete emptying) and dysuria. He had transient episode of epigastric discomfort, rated at 5/10 in severity, nonradiating, worse on sitting up and better on lying on his stomach which resolved after he burped a couple of times. He gives a chronic history of similar GI symptoms. He denies dyspnea, chest pain, cough.  Assessment/Plan: 1. Sepsis secondary to complicated UTI/CAUTI: Met sepsis criteria on admission (identified source, temp of 103, elevated WBC's, elevated HR and elevated lactic acid). Received aggressive IVF's and is on IV antibiotics. Lactic acid resolved, no febrile and HR normal. Patient WBC's still elevated. Cultures and sensitivity results pending. Will continue supportive care.   2. Acute urinary retention secondary to BPH: Followed by outpatient Urology (Dr. Sherryl Barters @ [facility]). Patient with more than 600 mL on bladder scan and required to be placed back on Foley. Will continue Flomax and Proscar. 3. Mild  thrombocytopenia: Probably related to sepsis. Resolved on repeated CBC after IV fluid resuscitation. Will monitor trend. 4. Essential hypertension: Reasonably controlled. Continue current antihypertensive regimen. 5. GERD: PPI. Will continue when necessary GI cocktail 6. Hyperlipidemia: Continue statins 7. Elevated PSA: Supposed to have prostate biopsy in the next couple of weeks by urology. Continue outpatient follow-up. 8. Obesity/Body mass index is 32.24 kg/m. Low calorie diet and exercise has been discussed with patient 9. Insomnia: will use PRN ambien   Code Status: Full code Family Communication: No family at bedside Disposition Plan: Patient will remain in the hospital, will continue IV antibiotics and follow cultures, telemetry will be discontinued, follow electrolytes and replete them as needed. -WBC's 23,000 today.   Consultants:  None  Procedures:  See below for x-ray report  Antibiotics: Cefepime 06/06/16  HPI/Subjective: Currently afebrile, feeling a whole lot better, denies chest pain, nausea, vomiting, dysuria or shortness of breath.  Objective: Vitals:   06/07/16 0528 06/07/16 1029  BP: (!) 147/56 134/66  Pulse: 84 87  Resp: 18   Temp: 98.9 F (37.2 C)     Intake/Output Summary (Last 24 hours) at 06/07/16 1242 Last data filed at 06/07/16 0530  Gross per 24 hour  Intake          1033.75 ml  Output             2602 ml  Net         -1568.25 ml   Filed Weights   06/06/16 0655 06/06/16 1143 06/07/16 0528  Weight: 93 kg (205 lb) 90.6 kg (199 lb 11.8 oz) 90.6 kg (199 lb 11.8 oz)    Exam:   General:  Afebrile, denies chest pain, shortness of breath, nausea  and vomiting. Patient is still reports some intermittent episode of epigastric discomfort for and some sour taste in his mouth.  Cardiovascular: S1 and S2, no rubs, no gallops  Respiratory: Good air movement bilaterally, no use of accessory muscles, no wheezing, no crackles  Abdomen: Opiates, soft,  nontender, nondistended, no guarding, positive bowel sounds  Musculoskeletal: No cyanosis or clubbing   Data Reviewed: Basic Metabolic Panel:  Recent Labs Lab 06/06/16 0751 06/06/16 1357 06/07/16 0638  NA 137  --  137  K 4.0  --  3.6  CL 104  --  107  CO2 26  --  23  GLUCOSE 103*  --  95  BUN 19  --  16  CREATININE 1.09  --  1.03  CALCIUM 9.2  --  8.5*  MG  --  1.4*  --    Liver Function Tests:  Recent Labs Lab 06/06/16 0751  AST 39  ALT 44  ALKPHOS 84  BILITOT 1.0  PROT 7.3  ALBUMIN 4.1   CBC:  Recent Labs Lab 06/06/16 0751 06/07/16 0638  WBC 13.9* 23.5*  NEUTROABS 12.4*  --   HGB 15.4 13.3  HCT 44.1 38.4*  MCV 91.9 91.2  PLT 124* 126*   CBG: No results for input(s): GLUCAP in the last 168 hours.  Recent Results (from the past 240 hour(s))  Blood Culture (routine x 2)     Status: None (Preliminary result)   Collection Time: 06/06/16  7:07 AM  Result Value Ref Range Status   Specimen Description BLOOD RIGHT ARM  Final   Special Requests BOTTLES DRAWN AEROBIC AND ANAEROBIC EACH  Final   Culture   Final    NO GROWTH < 24 HOURS Performed at [facility], [address], [place], [place] [address]    Report Status PENDING  Incomplete  Blood Culture (routine x 2)     Status: None (Preliminary result)   Collection Time: 06/06/16  7:50 AM  Result Value Ref Range Status   Specimen Description BLOOD LEFT HAND  Final   Special Requests BOTTLES DRAWN AEROBIC ONLY  6CC  Final   Culture   Final    NO GROWTH < 24 HOURS Performed at [facility], [address] [place], [place] [address]    Report Status PENDING  Incomplete  Urine culture     Status: Abnormal (Preliminary result)   Collection Time: 06/06/16  7:51 AM  Result Value Ref Range Status   Specimen Description URINE, CLEAN CATCH  Final   Special Requests NONE  Final   Culture (A)  Final    80,000 COLONIES/mL PSEUDOMONAS AERUGINOSA SUSCEPTIBILITIES TO FOLLOW Performed at [facility], [address], [place], [place] [address]    Report Status PENDING  Incomplete     Studies: Dg Chest 2 View  Result Date: 06/06/2016 CLINICAL DATA:  Chest pain and shortness of breath since early this morning. EXAM: CHEST  2 VIEW COMPARISON:  05/10/2012 FINDINGS: The cardiac silhouette, mediastinal and hilar contours are within normal limits and stable. The lungs are clear. No pleural effusion. The bony thorax is intact. IMPRESSION: No acute cardiopulmonary findings. Electronically Signed   By: Rudie Meyer M.D.   On: 06/06/2016 08:01    Scheduled Meds: . aspirin EC  81 mg Oral QODAY  . ceFEPime (MAXIPIME) IV  2 g Intravenous Q12H  . enoxaparin (LOVENOX) injection  40 mg Subcutaneous Q24H  . finasteride  5 mg Oral QHS  .  losartan  100 mg Oral Daily  . metoprolol tartrate  25 mg Oral BID  . pantoprazole  40 mg Oral Daily  . sodium chloride flush  3 mL Intravenous Q12H  . tamsulosin  0.4 mg Oral BID   Continuous Infusions:  Principal Problem:   Sepsis secondary to UTI (HCC) Active Problems:   Hyperlipidemia   GERD (gastroesophageal reflux disease)   Elevated PSA   HTN (hypertension)    Time spent: 25 minutes    Vassie Loll  [facility] Pager [phone]. If 7PM-7AM, please contact night-coverage at [email or web address], password [facility] 06/07/2016, 12:42 PM  LOS: 1 day

## 2016-06-07 NOTE — Progress Notes (Signed)
Pt Temp 101.7, HR 82, BP 142/65. Tylenol 650 mg po adm. MD notified. Will continue to monitor.

## 2016-06-08 DIAGNOSIS — K5900 Constipation, unspecified: Secondary | ICD-10-CM

## 2016-06-08 LAB — URINE CULTURE

## 2016-06-08 LAB — BASIC METABOLIC PANEL
ANION GAP: 6 (ref 5–15)
BUN: 18 mg/dL (ref 6–20)
CALCIUM: 8.7 mg/dL — AB (ref 8.9–10.3)
CO2: 24 mmol/L (ref 22–32)
CREATININE: 0.88 mg/dL (ref 0.61–1.24)
Chloride: 108 mmol/L (ref 101–111)
GFR calc Af Amer: >60 mL/min (ref >60)
GLUCOSE: 98 mg/dL (ref 65–99)
Potassium: 3.5 mmol/L (ref 3.5–5.1)
Sodium: 138 mmol/L (ref 135–145)

## 2016-06-08 LAB — CBC
HCT: 37.9 % — ABNORMAL LOW (ref 39.0–52.0)
Hemoglobin: 12.9 g/dL — ABNORMAL LOW (ref 13.0–17.0)
MCH: 31.5 pg (ref 26.0–34.0)
MCHC: 34 g/dL (ref 30.0–36.0)
MCV: 92.7 fL (ref 78.0–100.0)
PLATELETS: 119 10*3/uL — AB (ref 150–400)
RBC: 4.09 MIL/uL — ABNORMAL LOW (ref 4.22–5.81)
RDW: 13.3 % (ref 11.5–15.5)
WBC: 23 10*3/uL — AB (ref 4.0–10.5)

## 2016-06-08 MED ORDER — DOCUSATE SODIUM 100 MG PO CAPS
100.0000 mg | ORAL_CAPSULE | Freq: Two times a day (BID) | ORAL | Status: DC
  Administered 2016-06-08 – 2016-06-09 (×2): 100 mg via ORAL
  Filled 2016-06-08 (×3): qty 1

## 2016-06-08 MED ORDER — SODIUM CHLORIDE 0.9 % IV SOLN
INTRAVENOUS | Status: AC
  Administered 2016-06-08: 09:00:00 via INTRAVENOUS

## 2016-06-08 NOTE — Progress Notes (Signed)
[facility] PROGRESS NOTE  Gerald [name]:[id number] DOB: April 22, 1945 DOA: 06/06/2016 PCP: Lemont Fillers., NP   Interim summary and HPI 71 year old male, married, lives with spouse and independent of activities of daily living, PMH of BPH, acute urinary retention status post Foley catheter 1 week, removed by urology in office on 06/05/16, elevated PSA and awaiting prostate biopsy in 2 weeks, GERD, HLD, HTN, mild OSA not on CPAP, former smoker, presented to [facility] on 06/16/16 with complaints of high fever, chills, rigors and difficulty urinating. Patient states that he was in his usual state of health after he returned home from urologist's office where Foley catheter was removed yesterday. He was drinking adequate amount of liquids as per instructions and urinating well. At approximately 1 AM on day of admission, he started experiencing high fevers up to 103F, chills, rigors and difficulty urinating (poor stream, dribbling of urine, incomplete emptying) and dysuria. He had transient episode of epigastric discomfort, rated at 5/10 in severity, nonradiating, worse on sitting up and better on lying on his stomach which resolved after he burped a couple of times. He gives a chronic history of similar GI symptoms. He denies dyspnea, chest pain, cough.  Assessment/Plan: 1. Sepsis secondary to pseudomonas complicated UTI/CAUTI: Met sepsis criteria on admission (identified source, temp of 103, elevated WBC's, elevated HR and elevated lactic acid). Received aggressive IVF's and is on IV antibiotics. Lactic acid resolved, no febrile currently and HR normal. Patient WBC's still elevated and spike fever yesterday. Cultures showing pseudomonas; will treat for another day with IV antibiotics; to achieved 24-48 hours w/o fever and then switched to ciprofloxacin and complete a total of 14 days of antibiotics (as he will remained with foley in place).   2. Acute urinary retention secondary to  BPH: Followed by outpatient Urology (Dr. Sherryl Barters @ [facility]). Patient with more than 600 mL on bladder scan and required to be placed back on Foley. Will continue Flomax and Proscar. 3. Mild thrombocytopenia: Probably related to sepsis. Resolved on repeated CBC after IV fluid resuscitation. Will monitor trend. 4. Essential hypertension: Reasonably controlled. Continue current antihypertensive regimen. 5. GERD: PPI. Will continue when necessary GI cocktail 6. Hyperlipidemia: Continue statins 7. Elevated PSA: Supposed to have prostate biopsy in the next couple of weeks by urology. Continue outpatient follow-up. 8. Obesity/Body mass index is 32.24 kg/m. Low calorie diet and exercise has been discussed with patient 9. Insomnia: will use PRN ambien  10. Constipation: will use colace. Patient passing flatus.  Code Status: Full code Family Communication: No family at bedside Disposition Plan: Patient will remain in the hospital, will continue IV antibiotics and follow cultures, telemetry will be discontinued, follow electrolytes and replete them as needed. WBC's still elevated and have fever on 3/10.   Consultants:  None  Procedures:  See below for x-ray report  Antibiotics: Cefepime 06/06/16  HPI/Subjective: Currently afebrile, feeling better overall; complaining only of constipation and decrease appetite. No CP or SOB .  Objective: Vitals:   06/07/16 2107 06/08/16 0434  BP: (!) 142/68 (!) 151/88  Pulse: 77 86  Resp: 18 18  Temp: 98.5 F (36.9 C) 98.4 F (36.9 C)    Intake/Output Summary (Last 24 hours) at 06/08/16 1325 Last data filed at 06/08/16 0600  Gross per 24 hour  Intake               50 ml  Output             2800 ml  Net            -2750 ml   Filed Weights   06/06/16 1143 06/07/16 0528 06/08/16 0437  Weight: 90.6 kg (199 lb 11.8 oz) 90.6 kg (199 lb 11.8 oz) 92.8 kg (204 lb 9.4 oz)    Exam:   General:  Afebrile, denies chest pain, shortness of breath,  nausea and vomiting. Patient reports some constipation; otherwise no acute complaints.   Cardiovascular: S1 and S2, no rubs, no gallops  Respiratory: Good air movement bilaterally, no use of accessory muscles, no wheezing, no crackles  Abdomen: Opiates, soft, nontender, nondistended, no guarding, positive bowel sounds  Musculoskeletal: No cyanosis or clubbing   Data Reviewed: Basic Metabolic Panel:  Recent Labs Lab 06/06/16 0751 06/06/16 1357 06/07/16 0638 06/08/16 0533  NA 137  --  137 138  K 4.0  --  3.6 3.5  CL 104  --  107 108  CO2 26  --  23 24  GLUCOSE 103*  --  95 98  BUN 19  --  16 18  CREATININE 1.09  --  1.03 0.88  CALCIUM 9.2  --  8.5* 8.7*  MG  --  1.4*  --   --    Liver Function Tests:  Recent Labs Lab 06/06/16 0751  AST 39  ALT 44  ALKPHOS 84  BILITOT 1.0  PROT 7.3  ALBUMIN 4.1   CBC:  Recent Labs Lab 06/06/16 0751 06/07/16 0638 06/08/16 0533  WBC 13.9* 23.5* 23.0*  NEUTROABS 12.4*  --   --   HGB 15.4 13.3 12.9*  HCT 44.1 38.4* 37.9*  MCV 91.9 91.2 92.7  PLT 124* 126* 119*   CBG: No results for input(s): GLUCAP in the last 168 hours.  Recent Results (from the past 240 hour(s))  Blood Culture (routine x 2)     Status: None (Preliminary result)   Collection Time: 06/06/16  7:07 AM  Result Value Ref Range Status   Specimen Description BLOOD RIGHT ARM  Final   Special Requests BOTTLES DRAWN AEROBIC AND ANAEROBIC EACH  Final   Culture   Final    NO GROWTH < 24 HOURS Performed at [facility], [address], [place], [place] [address]    Report Status PENDING  Incomplete  Blood Culture (routine x 2)     Status: None (Preliminary result)   Collection Time: 06/06/16  7:50 AM  Result Value Ref Range Status   Specimen Description BLOOD LEFT HAND  Final   Special Requests BOTTLES DRAWN AEROBIC ONLY  6CC  Final   Culture   Final    NO GROWTH < 24 HOURS Performed at [facility], [address], [place], [place] [address]     Report Status PENDING  Incomplete  Urine culture     Status: Abnormal   Collection Time: 06/06/16  7:51 AM  Result Value Ref Range Status   Specimen Description URINE, CLEAN CATCH  Final   Special Requests NONE  Final   Culture 80,000 COLONIES/mL PSEUDOMONAS AERUGINOSA (A)  Final   Report Status 06/08/2016 FINAL  Final   Organism ID, Bacteria PSEUDOMONAS AERUGINOSA (A)  Final      Susceptibility   Pseudomonas aeruginosa - MIC*    CEFTAZIDIME 4 SENSITIVE Sensitive     CIPROFLOXACIN <=0.25 SENSITIVE Sensitive     GENTAMICIN <=1 SENSITIVE Sensitive     IMIPENEM 1 SENSITIVE Sensitive     PIP/TAZO 8 SENSITIVE Sensitive     CEFEPIME  2 SENSITIVE Sensitive     * 80,000 COLONIES/mL PSEUDOMONAS AERUGINOSA     Studies: No results found.  Scheduled Meds: . aspirin EC  81 mg Oral QODAY  . ceFEPime (MAXIPIME) IV  2 g Intravenous Q12H  . docusate sodium  100 mg Oral BID  . enoxaparin (LOVENOX) injection  40 mg Subcutaneous Q24H  . finasteride  5 mg Oral QHS  . losartan  100 mg Oral Daily  . metoprolol tartrate  25 mg Oral BID  . pantoprazole  40 mg Oral Daily  . sodium chloride flush  3 mL Intravenous Q12H  . tamsulosin  0.4 mg Oral BID   Continuous Infusions: . sodium chloride 75 mL/hr at 06/08/16 0900    Principal Problem:   Sepsis secondary to UTI [facility]) Active Problems:   Hyperlipidemia   GERD (gastroesophageal reflux disease)   Elevated PSA   HTN (hypertension)    Time spent: 25 minutes    Vassie Loll  [facility] Pager [phone]. If 7PM-7AM, please contact night-coverage at [email or web address], password [facility] 06/08/2016, 1:25 PM  LOS: 2 days

## 2016-06-09 ENCOUNTER — Telehealth: Payer: Self-pay | Admitting: Family

## 2016-06-09 DIAGNOSIS — E876 Hypokalemia: Secondary | ICD-10-CM

## 2016-06-09 LAB — BASIC METABOLIC PANEL
Anion gap: 6 (ref 5–15)
BUN: 15 mg/dL (ref 6–20)
CHLORIDE: 109 mmol/L (ref 101–111)
CO2: 25 mmol/L (ref 22–32)
CREATININE: 0.85 mg/dL (ref 0.61–1.24)
Calcium: 8.9 mg/dL (ref 8.9–10.3)
GFR calc non Af Amer: >60 mL/min (ref >60)
GLUCOSE: 100 mg/dL — AB (ref 65–99)
Potassium: 3.2 mmol/L — ABNORMAL LOW (ref 3.5–5.1)
Sodium: 140 mmol/L (ref 135–145)

## 2016-06-09 LAB — CBC
HCT: 38.9 % — ABNORMAL LOW (ref 39.0–52.0)
HEMOGLOBIN: 13.4 g/dL (ref 13.0–17.0)
MCH: 31.5 pg (ref 26.0–34.0)
MCHC: 34.4 g/dL (ref 30.0–36.0)
MCV: 91.3 fL (ref 78.0–100.0)
Platelets: 138 10*3/uL — ABNORMAL LOW (ref 150–400)
RBC: 4.26 MIL/uL (ref 4.22–5.81)
RDW: 13 % (ref 11.5–15.5)
WBC: 15.3 10*3/uL — ABNORMAL HIGH (ref 4.0–10.5)

## 2016-06-09 MED ORDER — CIPROFLOXACIN HCL 500 MG PO TABS: 500.0000 mg | ORAL_TABLET | Freq: Two times a day (BID) | ORAL | 0 refills | Status: AC

## 2016-06-09 MED ORDER — POTASSIUM CHLORIDE ER 10 MEQ PO TBCR: 20.0000 meq | EXTENDED_RELEASE_TABLET | Freq: Every day | ORAL | 0 refills | Status: DC

## 2016-06-09 NOTE — Telephone Encounter (Signed)
Please contact pt to arrange post hospital follow up.

## 2016-06-09 NOTE — Discharge Summary (Signed)
Physician Discharge Summary  Gerald [name] BTD:[id number] DOB: 13-Jan-1946 DOA: 06/06/2016  PCP: [name], NP  Admit date: 06/06/2016 Discharge date: 06/09/2016  Time spent: 35 minutes  Recommendations for Outpatient Follow-up:  1. Repeat BMET to follow electrolytes and renal function  2. Repeat CBC to assure resolution of elevated WBC's 3. Outpatient follow up with urology for voiding trial and cystoscopy/urodynamics studies if needed.    Discharge Diagnoses:  Principal Problem:   Sepsis secondary to UTI [name]) Active Problems:   Hyperlipidemia   GERD (gastroesophageal reflux disease)   Elevated PSA   HTN (hypertension)   Hypokalemia   Discharge Condition: stable and improved. Discharge home with instructions to follow up with PCP and urology service as an outpatient. Patient discharge with foley catheter in place.  Diet recommendation: heart healthy/low calorie diet  Filed Weights   06/07/16 0528 06/08/16 0437 06/09/16 0500  Weight: 90.6 kg (199 lb 11.8 oz) 92.8 kg (204 lb 9.4 oz) 90.7 kg (199 lb 14.4 oz)    History of present illness:  71 year old male, married, lives with spouse and independent of activities of daily living, PMH of BPH, acute urinary retention status post Foley catheter 1 week, removed by urology in office on 06/05/16, elevated PSA and awaiting prostate biopsy in 2 weeks, GERD, HLD, HTN, mild OSA not on CPAP, former smoker, presented to [facility] [place] [facility] on 06/16/16 with complaints of high fever, chills, rigors and difficulty urinating. Patient states that he was in his usual state of health after he returned home from urologist's office where Foley catheter was removed yesterday. He was drinking adequate amount of liquids as per instructions and urinating well. At approximately 1 AM on day of admission, he started experiencing high fevers up to 103F, chills, rigors and difficulty urinating (poor stream, dribbling of urine, incomplete emptying) and  dysuria. He had transient episode of epigastric discomfort, rated at 5/10 in severity, nonradiating, worse on sitting up and better on lying on his stomach which resolved after he burped a couple of times. He gives a chronic history of similar GI symptoms. He denies dyspnea, chest pain, cough.  Hospital Course:  1. Sepsis secondary to pseudomonas complicated UTI/CAUTI:Met sepsis criteria on admission (identified source, temp of 103, elevated WBC's, elevated HR and elevated lactic acid). Received aggressive IVF's and was on IV antibiotics until afebrile fro 48 hours. Lactic acid resolved, no fever, normal HR and WBC's trending down. Cultures showing pseudomonas; Patient will be discharge on ciprofloxacin and complete a total of 14 days of antibiotics. 2. Acute urinary retention secondary to TGG:YIRSWNIO by outpatient Urology (Dr. Pilar Jarvis @ [facility]). Patient with more than 600 mL on bladder scan and required to be placed back on Foley. Will continue Flomax and Proscar. Outpatient follow up with urology for voiding trial, cystoscopy if needed and further work up/treatment for urinary retention.  3. Mild thrombocytopenia:Probably related to sepsis. Resolved on repeated CBC after IV fluid resuscitation and antibiotic therapy. Will recommend CBC to monitor trend. 4. Essential hypertension:Reasonably controlled. Continue current antihypertensive regimen. 5. GERD: PPI.  6. Hyperlipidemia: Continue statins 7. Elevated PSA: Supposed to have prostate biopsy in the next couple of weeks by urology. Continue outpatient follow-up. 8. Obesity/Body mass index is 32.24 kg/m. Low calorie diet and exercise has been discussed with patient 9. Insomnia: PRN ambien used while inpatient. Advise to follow good sleep hygiene   10. Constipation: resolved with use of colace. Had 2 BM prior to discharge. 11. Hypokalemia: repleted. Repeat BMET to  follow electrolytes at up coming visit.   Procedures:  See below for  x-ray report  Consultations:  Urology Curbside (recommended to discharge with foley and on antibiotic therapy; outpatient follow up for voiding trial and cystoscopy if needed)  Discharge Exam: Vitals:   06/09/16 0545 06/09/16 0934  BP: 123/88 (!) 145/74  Pulse: (!) 109 91  Resp: 18   Temp: 98 F (36.7 C)      General:  Afebrile (for 48 hours now), denies chest pain, shortness of breath, nausea and vomiting. Patient reports having 2 BM's now and endorses no acute complaints.    Cardiovascular: S1 and S2, no rubs, no gallops  Respiratory: Good air movement bilaterally, no use of accessory muscles, no wheezing, no crackles  Abdomen: Opiates, soft, nontender, nondistended, no guarding, positive bowel sounds  Musculoskeletal: No cyanosis or clubbing    Discharge Instructions   Discharge Instructions    Diet - low sodium heart healthy    Complete by:  As directed    Discharge instructions    Complete by:  As directed    Take medications as prescribed Please keep yourself well hydrated  Follow up with urology service (call office for appointment)     Current Discharge Medication List    START taking these medications   Details  ciprofloxacin (CIPRO) 500 MG tablet Take 1 tablet (500 mg total) by mouth 2 (two) times daily. Qty: 22 tablet, Refills: 0    potassium chloride (K-DUR) 10 MEQ tablet Take 2 tablets (20 mEq total) by mouth daily. Qty: 6 tablet, Refills: 0      CONTINUE these medications which have NOT CHANGED   Details  aspirin EC 81 MG tablet Take 81 mg by mouth every other day.    finasteride (PROSCAR) 5 MG tablet Take 5 mg by mouth at bedtime.  Refills: 11    ketoconazole (NIZORAL) 2 % shampoo Apply 1 application topically as needed for irritation. For eczema    losartan (COZAAR) 100 MG tablet TAKE 1 TABLET(100 MG) BY MOUTH DAILY Qty: 90 tablet, Refills: 0    MAGNESIUM PO Take 1,250 mg by mouth 4 (four) times a week.     metoprolol tartrate  (LOPRESSOR) 25 MG tablet TAKE 1 TABLET BY MOUTH TWICE DAILY Qty: 60 tablet, Refills: 1    Multiple Vitamins-Minerals (MULTIVITAMIN ADULTS 50+ PO) Take 1 tablet by mouth daily.    Omega-3 Fatty Acids (FISH OIL) 1200 MG CAPS Take 1 capsule by mouth daily.    omeprazole (PRILOSEC) 20 MG capsule Take 1 capsule (20 mg total) by mouth daily. Qty: 30 capsule, Refills: 3    tamsulosin (FLOMAX) 0.4 MG CAPS capsule Take 0.4 mg by mouth 2 (two) times daily.       Allergies  Allergen Reactions  . Augmentin [Amoxicillin-Pot Clavulanate] Diarrhea    *Can take plain Amoxicillin* Has patient had a PCN reaction causing immediate rash, facial/tongue/throat swelling, SOB or lightheadedness with hypotension: No Has patient had a PCN reaction causing severe rash involving mucus membranes or skin necrosis: No Has patient had a PCN reaction that required hospitalization No Has patient had a PCN reaction occurring within the last 10 years: Yes If all of the above answers are "NO", then may proceed with Cephalosporin use.   Marland Kitchen Penicillins Rash    Childhood reaction *Can take Amoxicillin* Has patient had a PCN reaction causing immediate rash, facial/tongue/throat swelling, SOB or lightheadedness with hypotension: Unknown Has patient had a PCN reaction causing severe rash involving mucus membranes  Physician Discharge Summary  Gerald [name] BTD:[id number] DOB: 13-Jan-1946 DOA: 06/06/2016  PCP: [name], NP  Admit date: 06/06/2016 Discharge date: 06/09/2016  Time spent: 35 minutes  Recommendations for Outpatient Follow-up:  1. Repeat BMET to follow electrolytes and renal function  2. Repeat CBC to assure resolution of elevated WBC's 3. Outpatient follow up with urology for voiding trial and cystoscopy/urodynamics studies if needed.    Discharge Diagnoses:  Principal Problem:   Sepsis secondary to UTI [name]) Active Problems:   Hyperlipidemia   GERD (gastroesophageal reflux disease)   Elevated PSA   HTN (hypertension)   Hypokalemia   Discharge Condition: stable and improved. Discharge home with instructions to follow up with PCP and urology service as an outpatient. Patient discharge with foley catheter in place.  Diet recommendation: heart healthy/low calorie diet  Filed Weights   06/07/16 0528 06/08/16 0437 06/09/16 0500  Weight: 90.6 kg (199 lb 11.8 oz) 92.8 kg (204 lb 9.4 oz) 90.7 kg (199 lb 14.4 oz)    History of present illness:  71 year old male, married, lives with spouse and independent of activities of daily living, PMH of BPH, acute urinary retention status post Foley catheter 1 week, removed by urology in office on 06/05/16, elevated PSA and awaiting prostate biopsy in 2 weeks, GERD, HLD, HTN, mild OSA not on CPAP, former smoker, presented to [facility] [place] on 06/16/16 with complaints of high fever, chills, rigors and difficulty urinating. Patient states that he was in his usual state of health after he returned home from urologist's office where Foley catheter was removed yesterday. He was drinking adequate amount of liquids as per instructions and urinating well. At approximately 1 AM on day of admission, he started experiencing high fevers up to 103F, chills, rigors and difficulty urinating (poor stream, dribbling of urine, incomplete emptying) and  dysuria. He had transient episode of epigastric discomfort, rated at 5/10 in severity, nonradiating, worse on sitting up and better on lying on his stomach which resolved after he burped a couple of times. He gives a chronic history of similar GI symptoms. He denies dyspnea, chest pain, cough.  Hospital Course:  1. Sepsis secondary to pseudomonas complicated UTI/CAUTI:Met sepsis criteria on admission (identified source, temp of 103, elevated WBC's, elevated HR and elevated lactic acid). Received aggressive IVF's and was on IV antibiotics until afebrile fro 48 hours. Lactic acid resolved, no fever, normal HR and WBC's trending down. Cultures showing pseudomonas; Patient will be discharge on ciprofloxacin and complete a total of 14 days of antibiotics. 2. Acute urinary retention secondary to TGG:YIRSWNIO by outpatient Urology (Dr. Pilar Jarvis @ [facility]). Patient with more than 600 mL on bladder scan and required to be placed back on Foley. Will continue Flomax and Proscar. Outpatient follow up with urology for voiding trial, cystoscopy if needed and further work up/treatment for urinary retention.  3. Mild thrombocytopenia:Probably related to sepsis. Resolved on repeated CBC after IV fluid resuscitation and antibiotic therapy. Will recommend CBC to monitor trend. 4. Essential hypertension:Reasonably controlled. Continue current antihypertensive regimen. 5. GERD: PPI.  6. Hyperlipidemia: Continue statins 7. Elevated PSA: Supposed to have prostate biopsy in the next couple of weeks by urology. Continue outpatient follow-up. 8. Obesity/Body mass index is 32.24 kg/m. Low calorie diet and exercise has been discussed with patient 9. Insomnia: PRN ambien used while inpatient. Advise to follow good sleep hygiene   10. Constipation: resolved with use of colace. Had 2 BM prior to discharge. 11. Hypokalemia: repleted. Repeat BMET to  23 24 25   GLUCOSE 103*  --  95 98 100*  BUN 19  --  16 18 15   CREATININE 1.09  --  1.03 0.88 0.85  CALCIUM 9.2  --  8.5* 8.7* 8.9  MG  --  1.4*  --   --   --    Liver Function Tests:  Recent Labs Lab 06/06/16 0751  AST 39  ALT 44  ALKPHOS 84  BILITOT 1.0  PROT 7.3  ALBUMIN 4.1   CBC:  Recent Labs Lab 06/06/16 0751 06/07/16 0638 06/08/16 0533 06/09/16 0433  WBC 13.9* 23.5* 23.0* 15.3*  NEUTROABS 12.4*  --   --   --   HGB 15.4 13.3 12.9* 13.4  HCT 44.1 38.4* 37.9* 38.9*  MCV 91.9 91.2 92.7 91.3  PLT 124* 126* 119* 138*    Signed:  Vassie Loll MD.  [facility] 06/09/2016, 12:22 PM

## 2016-06-10 NOTE — Telephone Encounter (Signed)
Transition Care Management Follow-up Telephone Call  PCP: [name], NP  Admit date: 06/06/2016 Discharge date: 06/09/2016   Recommendations for Outpatient Follow-up:  1. Repeat BMET to follow electrolytes and renal function  2. Repeat CBC to assure resolution of elevated WBC's 3. Outpatient follow up with urology for voiding trial and cystoscopy/urodynamics studies if needed.    Discharge Diagnoses:  Principal Problem:   Sepsis secondary to UTI [facility]) Active Problems:   Hyperlipidemia   GERD (gastroesophageal reflux disease)   Elevated PSA   HTN (hypertension)   Hypokalemia   Discharge Condition: stable and improved. Discharge home with instructions to follow up with PCP and urology service as an outpatient. Patient discharge with foley catheter in place.   How have you been since you were released from the hospital? Patient stated, that "he's hanging in there, up & ambulating".   Do you understand why you were in the hospital? yes   Do you understand the discharge instructions? yes   Where were you discharged to? Home   Items Reviewed:  Medications reviewed: yes  Allergies reviewed: yes  Dietary changes reviewed: yes, heart healthy diet  Referrals reviewed: yes, follow up with PCP and urology service as an outpatient; Repeat BMET to follow electrolytes and renal function; Repeat CBC to assure resolution of elevated WBC's; Outpatient follow up with urology for voiding trial and cystoscopy/urodynamics studies if needed.   Functional Questionnaire:   Activities of Daily Living (ADLs):   He states they are independent in the following: ambulation, bathing and hygiene, feeding, continence, grooming, toileting and dressing States they require assistance with the following: None   Any transportation issues/concerns?: no   Any patient concerns? no   Confirmed importance and date/time of follow-up visits scheduled yes, 06/18/16 at 12:45  PM.  Provider Appointment booked with Debbrah Alar, NP.  Confirmed with patient if condition begins to worsen call PCP or go to the ER.  Patient was given the office number and encouraged to call back with question or concerns.  : yes

## 2016-06-11 LAB — CULTURE, BLOOD (ROUTINE X 2)
CULTURE: NO GROWTH
Culture: NO GROWTH

## 2016-06-16 DIAGNOSIS — R338 Other retention of urine: Secondary | ICD-10-CM | POA: Diagnosis not present

## 2016-06-18 ENCOUNTER — Ambulatory Visit (INDEPENDENT_AMBULATORY_CARE_PROVIDER_SITE_OTHER): Payer: Medicare Other | Admitting: Family

## 2016-06-18 ENCOUNTER — Encounter: Payer: Self-pay | Admitting: Family

## 2016-06-18 VITALS — BP 138/69 | HR 63 | Temp 98.4°F | Resp 17 | Ht 66.0 in | Wt 200.4 lb

## 2016-06-18 DIAGNOSIS — A4152 Sepsis due to Pseudomonas: Secondary | ICD-10-CM

## 2016-06-18 LAB — CBC WITH DIFFERENTIAL/PLATELET
BASOS ABS: 0.1 10*3/uL (ref 0.0–0.1)
Basophils Relative: 0.7 % (ref 0.0–3.0)
EOS ABS: 0.1 10*3/uL (ref 0.0–0.7)
Eosinophils Relative: 0.6 % (ref 0.0–5.0)
HCT: 40.7 % (ref 39.0–52.0)
Hemoglobin: 13.8 g/dL (ref 13.0–17.0)
LYMPHS ABS: 1.9 10*3/uL (ref 0.7–4.0)
LYMPHS PCT: 21.1 % (ref 12.0–46.0)
MCHC: 33.9 g/dL (ref 30.0–36.0)
MCV: 95.3 fl (ref 78.0–100.0)
MONO ABS: 0.6 10*3/uL (ref 0.1–1.0)
Monocytes Relative: 6.8 % (ref 3.0–12.0)
NEUTROS PCT: 70.8 % (ref 43.0–77.0)
Neutro Abs: 6.5 10*3/uL (ref 1.4–7.7)
Platelets: 243 10*3/uL (ref 150.0–400.0)
RBC: 4.27 Mil/uL (ref 4.22–5.81)
RDW: 13.3 % (ref 11.5–15.5)
WBC: 9.2 10*3/uL (ref 4.0–10.5)

## 2016-06-18 LAB — BASIC METABOLIC PANEL
BUN: 22 mg/dL (ref 6–23)
CALCIUM: 9.6 mg/dL (ref 8.4–10.5)
CHLORIDE: 105 meq/L (ref 96–112)
CO2: 32 meq/L (ref 19–32)
Creatinine, Ser: 0.96 mg/dL (ref 0.40–1.50)
GFR: 82.36 mL/min (ref >60.00)
Glucose, Bld: 99 mg/dL (ref 70–99)
Potassium: 4.2 mEq/L (ref 3.5–5.1)
SODIUM: 137 meq/L (ref 135–145)

## 2016-06-18 NOTE — Progress Notes (Signed)
Pre visit review using our clinic review tool, if applicable. No additional management support is needed unless otherwise documented below in the visit note. 

## 2016-06-18 NOTE — Progress Notes (Signed)
Subjective:    Patient ID: Gerald [name], male    DOB: [birth year], 71 y.o.   MRN: [id number]  HPI  Gerald [name] is a 71 yr old male who presents today for hospital follow up.  The patient was admitted 06/06/16-06/09/16 with sepsis secondary to UTI.  He was diagnosed with pseudomonas. Also noted to have Urinary retention of requiring replacement of foley catheter.  Note was made of mild thrombocytopenia which was felt to be secondary to sepsis.  He was given an additional 11 days of cipro at the time of discharge. He is working with urology and will be having a "prostate reduction" with biopsy at the same.    Review of Systems    see HPI  Past Medical History:  Diagnosis Date  . Alcohol abuse    quit 1989  . Back pain, chronic   . Depression   . Diverticulosis   . Esophageal stricture   . Fatty liver   . GERD (gastroesophageal reflux disease)   . Hemorrhoids   . Hiatal hernia    EGD 3/10/3 stricture  . Hx of adenomatous colonic polyps   . Hyperlipidemia   . Hypertension   . Insomnia   . Melanoma (HCC)    right cheek  . 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 Onychomycosis   . OSA (obstructive sleep apnea)    mild, per sleep study  . PVD (peripheral vascular disease) (HCC)   . Sleep apnea    does not wear of c-pap     Social History   Social History  . Marital status: Married    Spouse name: N/A  . Number of children: 2  . Years of education: N/A   Occupational History  . Retired    Social History Main Topics  . Smoking status: Former Smoker    Quit date: 08/29/2006  . Smokeless tobacco: Never Used  . Alcohol use No     Comment: quit alcohol 1989  . Drug use: No  . Sexual activity: Not on file   Other Topics Concern  . Not on file   Social History Narrative   Diet: regular, lots of vegetables-----exercise : none but active   Caffeine use:  5 cups coffee daily          Past Surgical History:  Procedure  Laterality Date  . COLONOSCOPY    . CYSTECTOMY     Right leg remotely, back 2012  . mass removal     left shoulder  . Melonoma removed from face    . TONSILLECTOMY AND ADENOIDECTOMY    . UPPER GASTROINTESTINAL ENDOSCOPY    . WISDOM TOOTH EXTRACTION      Family History  Problem Relation Age of Onset  . Coronary artery disease Father   . Colon cancer Father   . Heart attack Paternal Grandfather   . Coronary artery disease Maternal Grandmother   . Colon cancer Maternal Grandmother   . Cancer Maternal Grandmother     colon  . Melanoma Mother   . Coronary artery disease Paternal Grandmother   . Heart attack Maternal Grandfather   . Hypertension Maternal Aunt   . Prostate cancer Neg Hx   . Stroke Neg Hx   . Esophageal cancer Neg Hx   . Pancreatic cancer Neg Hx   . Rectal cancer Neg Hx   . Stomach cancer Neg Hx     Allergies  Allergen Reactions  . Augmentin [  Amoxicillin-Pot Clavulanate] Diarrhea    *Can take plain Amoxicillin* Has patient had a PCN reaction causing immediate rash, facial/tongue/throat swelling, SOB or lightheadedness with hypotension: No Has patient had a PCN reaction causing severe rash involving mucus membranes or skin necrosis: No Has patient had a PCN reaction that required hospitalization No Has patient had a PCN reaction occurring within the last 10 years: Yes If all of the above answers are "NO", then may proceed with Cephalosporin use.   Marland Kitchen Penicillins Rash    Childhood reaction *Can take Amoxicillin* Has patient had a PCN reaction causing immediate rash, facial/tongue/throat swelling, SOB or lightheadedness with hypotension: Unknown Has patient had a PCN reaction causing severe rash involving mucus membranes or skin necrosis: Unknown Has patient had a PCN reaction that required hospitalization: Unknown Has patient had a PCN reaction occurring within the last 10 years: Unknown  If all of the above answers are "NO", then may proceed with Cephalosporin  use    Current Outpatient Prescriptions on File Prior to Visit  Medication Sig Dispense Refill  . aspirin EC 81 MG tablet Take 81 mg by mouth every other day.    . ciprofloxacin (CIPRO) 500 MG tablet Take 1 tablet (500 mg total) by mouth 2 (two) times daily. 22 tablet 0  . finasteride (PROSCAR) 5 MG tablet Take 5 mg by mouth at bedtime.   11  . ketoconazole (NIZORAL) 2 % shampoo Apply 1 application topically as needed for irritation. For eczema    . losartan (COZAAR) 100 MG tablet TAKE 1 TABLET(100 MG) BY MOUTH DAILY 90 tablet 0  . MAGNESIUM PO Take 1,250 mg by mouth 4 (four) times a week.     . metoprolol tartrate (LOPRESSOR) 25 MG tablet TAKE 1 TABLET BY MOUTH TWICE DAILY 60 tablet 1  . Multiple Vitamins-Minerals (MULTIVITAMIN ADULTS 50+ PO) Take 1 tablet by mouth daily.    . Omega-3 Fatty Acids (FISH OIL) 1200 MG CAPS Take 1 capsule by mouth daily.    Marland Kitchen omeprazole (PRILOSEC) 20 MG capsule Take 1 capsule (20 mg total) by mouth daily. 30 capsule 3  . tamsulosin (FLOMAX) 0.4 MG CAPS capsule Take 0.4 mg by mouth 2 (two) times daily.    . potassium chloride (K-DUR) 10 MEQ tablet Take 2 tablets (20 mEq total) by mouth daily. 6 tablet 0   No current facility-administered medications on file prior to visit.     BP 138/69 (BP Location: Right Arm, Patient Position: Sitting, Cuff Size: Large)   Pulse 63   Temp 98.4 F (36.9 C) (Oral)   Resp 17   Ht 5\' 6"  (1.676 m)   Wt 200 lb 6.4 oz (90.9 kg)   SpO2 97%   BMI 32.35 kg/m    Objective:   Physical Exam  Constitutional: He is oriented to person, place, and time. He appears well-developed and well-nourished. No distress.  HENT:  Head: Normocephalic and atraumatic.  Cardiovascular: Normal rate and regular rhythm.   No murmur heard. Pulmonary/Chest: Effort normal and breath sounds normal. No respiratory distress. He has no wheezes. He has no rales.  Musculoskeletal: He exhibits no edema.  Lymphadenopathy:    He has no cervical adenopathy.   Neurological: He is alert and oriented to person, place, and time.  Skin: Skin is warm and dry.  Psychiatric: He has a normal mood and affect. His behavior is normal. Thought content normal.          Assessment & Plan:  Sepsis due to pseudomonas species-  Completing cipro. Has follow up with urology for voiding trial.  Overall doing well since returning home.  Will obtain cbc to reassess wbc and plts, as well as bmet to assess K+ which was low at discharge.  Urology planning prostate reduction and biopsy.  Defer management to urology.

## 2016-06-18 NOTE — Patient Instructions (Signed)
Please complete lab work prior to leaving.   

## 2016-06-23 DIAGNOSIS — N401 Enlarged prostate with lower urinary tract symptoms: Secondary | ICD-10-CM | POA: Diagnosis not present

## 2016-06-23 DIAGNOSIS — R972 Elevated prostate specific antigen [PSA]: Secondary | ICD-10-CM | POA: Diagnosis not present

## 2016-06-23 DIAGNOSIS — R338 Other retention of urine: Secondary | ICD-10-CM | POA: Diagnosis not present

## 2016-06-24 ENCOUNTER — Other Ambulatory Visit: Payer: Self-pay | Admitting: Urology

## 2016-06-24 DIAGNOSIS — R972 Elevated prostate specific antigen [PSA]: Secondary | ICD-10-CM

## 2016-06-25 ENCOUNTER — Encounter: Payer: Self-pay | Admitting: Family

## 2016-07-04 ENCOUNTER — Ambulatory Visit: Payer: Medicare Other | Admitting: Family

## 2016-07-21 DIAGNOSIS — N401 Enlarged prostate with lower urinary tract symptoms: Secondary | ICD-10-CM | POA: Diagnosis not present

## 2016-07-22 ENCOUNTER — Encounter (HOSPITAL_BASED_OUTPATIENT_CLINIC_OR_DEPARTMENT_OTHER): Payer: Self-pay | Admitting: *Deleted

## 2016-07-22 DIAGNOSIS — F329 Major depressive disorder, single episode, unspecified: Secondary | ICD-10-CM | POA: Diagnosis not present

## 2016-07-22 DIAGNOSIS — Z880 Allergy status to penicillin: Secondary | ICD-10-CM | POA: Diagnosis not present

## 2016-07-22 DIAGNOSIS — Z87891 Personal history of nicotine dependence: Secondary | ICD-10-CM | POA: Diagnosis not present

## 2016-07-22 DIAGNOSIS — K449 Diaphragmatic hernia without obstruction or gangrene: Secondary | ICD-10-CM | POA: Diagnosis not present

## 2016-07-22 DIAGNOSIS — N401 Enlarged prostate with lower urinary tract symptoms: Secondary | ICD-10-CM | POA: Diagnosis not present

## 2016-07-22 DIAGNOSIS — R339 Retention of urine, unspecified: Secondary | ICD-10-CM | POA: Diagnosis not present

## 2016-07-22 DIAGNOSIS — Z9889 Other specified postprocedural states: Secondary | ICD-10-CM | POA: Diagnosis not present

## 2016-07-22 DIAGNOSIS — Z79899 Other long term (current) drug therapy: Secondary | ICD-10-CM | POA: Diagnosis not present

## 2016-07-22 DIAGNOSIS — K219 Gastro-esophageal reflux disease without esophagitis: Secondary | ICD-10-CM | POA: Diagnosis not present

## 2016-07-22 DIAGNOSIS — I10 Essential (primary) hypertension: Secondary | ICD-10-CM | POA: Diagnosis not present

## 2016-07-22 DIAGNOSIS — Z7982 Long term (current) use of aspirin: Secondary | ICD-10-CM | POA: Diagnosis not present

## 2016-07-22 LAB — BASIC METABOLIC PANEL
Anion gap: 7 (ref 5–15)
BUN: 24 mg/dL — ABNORMAL HIGH (ref 6–20)
CO2: 27 mmol/L (ref 22–32)
CREATININE: 1.06 mg/dL (ref 0.61–1.24)
Calcium: 9.2 mg/dL (ref 8.9–10.3)
Chloride: 104 mmol/L (ref 101–111)
GFR calc non Af Amer: >60 mL/min (ref >60)
GLUCOSE: 93 mg/dL (ref 65–99)
Potassium: 4 mmol/L (ref 3.5–5.1)
Sodium: 138 mmol/L (ref 135–145)

## 2016-07-22 LAB — CBC
HCT: 40 % (ref 39.0–52.0)
HEMOGLOBIN: 14 g/dL (ref 13.0–17.0)
MCH: 31.6 pg (ref 26.0–34.0)
MCHC: 35 g/dL (ref 30.0–36.0)
MCV: 90.3 fL (ref 78.0–100.0)
PLATELETS: 140 10*3/uL — AB (ref 150–400)
RBC: 4.43 MIL/uL (ref 4.22–5.81)
RDW: 13 % (ref 11.5–15.5)
WBC: 7 10*3/uL (ref 4.0–10.5)

## 2016-07-22 NOTE — Progress Notes (Signed)
NPO AFTER MN.  ARRIVE AT 1100.  GETTING CBC AND BMET DONE Thursday 07-24-2016 OR Friday, 07-25-2016.  CURRENT EKG IN CHART AND EPIC.  WILL TAKE PRILOSEC, METOPROLOL, COZAAR, PROSCAR, AND FLOMAX AM DOS W/ SIPS OF WATER.

## 2016-07-29 ENCOUNTER — Encounter (HOSPITAL_BASED_OUTPATIENT_CLINIC_OR_DEPARTMENT_OTHER)
Admission: RE | Disposition: A | Discharge status: Home / Self Care | Payer: Self-pay | Source: Ambulatory Visit | Attending: Urology

## 2016-07-29 ENCOUNTER — Ambulatory Visit (HOSPITAL_BASED_OUTPATIENT_CLINIC_OR_DEPARTMENT_OTHER): Payer: Medicare Other | Admitting: Certified Registered"

## 2016-07-29 ENCOUNTER — Encounter (HOSPITAL_BASED_OUTPATIENT_CLINIC_OR_DEPARTMENT_OTHER): Payer: Self-pay | Admitting: *Deleted

## 2016-07-29 ENCOUNTER — Ambulatory Visit (HOSPITAL_COMMUNITY)
Admission: RE | Admit: 2016-07-29 | Discharge: 2016-07-29 | Disposition: A | Discharge status: Home / Self Care | Payer: Medicare Other | Source: Ambulatory Visit | Attending: Urology | Admitting: Urology

## 2016-07-29 DIAGNOSIS — R972 Elevated prostate specific antigen [PSA]: Secondary | ICD-10-CM

## 2016-07-29 DIAGNOSIS — Z9889 Other specified postprocedural states: Secondary | ICD-10-CM | POA: Insufficient documentation

## 2016-07-29 DIAGNOSIS — K219 Gastro-esophageal reflux disease without esophagitis: Secondary | ICD-10-CM | POA: Diagnosis not present

## 2016-07-29 DIAGNOSIS — Z79899 Other long term (current) drug therapy: Secondary | ICD-10-CM | POA: Insufficient documentation

## 2016-07-29 DIAGNOSIS — I10 Essential (primary) hypertension: Secondary | ICD-10-CM | POA: Insufficient documentation

## 2016-07-29 DIAGNOSIS — Z880 Allergy status to penicillin: Secondary | ICD-10-CM | POA: Insufficient documentation

## 2016-07-29 DIAGNOSIS — Z7982 Long term (current) use of aspirin: Secondary | ICD-10-CM | POA: Insufficient documentation

## 2016-07-29 DIAGNOSIS — K449 Diaphragmatic hernia without obstruction or gangrene: Secondary | ICD-10-CM | POA: Insufficient documentation

## 2016-07-29 DIAGNOSIS — F329 Major depressive disorder, single episode, unspecified: Secondary | ICD-10-CM | POA: Insufficient documentation

## 2016-07-29 DIAGNOSIS — R338 Other retention of urine: Secondary | ICD-10-CM | POA: Diagnosis not present

## 2016-07-29 DIAGNOSIS — E785 Hyperlipidemia, unspecified: Secondary | ICD-10-CM | POA: Diagnosis not present

## 2016-07-29 DIAGNOSIS — N400 Enlarged prostate without lower urinary tract symptoms: Secondary | ICD-10-CM

## 2016-07-29 DIAGNOSIS — Z87891 Personal history of nicotine dependence: Secondary | ICD-10-CM | POA: Insufficient documentation

## 2016-07-29 DIAGNOSIS — N401 Enlarged prostate with lower urinary tract symptoms: Secondary | ICD-10-CM | POA: Diagnosis not present

## 2016-07-29 DIAGNOSIS — R339 Retention of urine, unspecified: Secondary | ICD-10-CM | POA: Diagnosis not present

## 2016-07-29 DIAGNOSIS — N411 Chronic prostatitis: Secondary | ICD-10-CM | POA: Diagnosis not present

## 2016-07-29 HISTORY — DX: Personal history of melanoma in-situ: Z86.006

## 2016-07-29 HISTORY — DX: Alcohol abuse, in remission: F10.11

## 2016-07-29 HISTORY — DX: Benign prostatic hyperplasia with lower urinary tract symptoms: N40.1

## 2016-07-29 HISTORY — DX: Sleep apnea, unspecified: G47.30

## 2016-07-29 HISTORY — DX: Personal history of other diseases of the digestive system: Z87.19

## 2016-07-29 HISTORY — DX: Diverticulosis of large intestine without perforation or abscess without bleeding: K57.30

## 2016-07-29 HISTORY — DX: Elevated prostate specific antigen (PSA): R97.20

## 2016-07-29 HISTORY — DX: Personal history of other infectious and parasitic diseases: Z86.19

## 2016-07-29 HISTORY — PX: THULIUM LASER TURP (TRANSURETHRAL RESECTION OF PROSTATE): SHX6744

## 2016-07-29 SURGERY — THULIUM LASER TURP (TRANSURETHRAL RESECTION OF PROSTATE)
Anesthesia: General

## 2016-07-29 MED ORDER — GENTAMICIN SULFATE 40 MG/ML IJ SOLN
5.0000 mg/kg | INTRAVENOUS | Status: AC
  Administered 2016-07-29: 370 mg via INTRAVENOUS
  Filled 2016-07-29: qty 9.25

## 2016-07-29 MED ORDER — MEPERIDINE HCL 25 MG/ML IJ SOLN
6.2500 mg | INTRAMUSCULAR | Status: DC | PRN
  Filled 2016-07-29: qty 1

## 2016-07-29 MED ORDER — FENTANYL CITRATE (PF) 100 MCG/2ML IJ SOLN
INTRAMUSCULAR | Status: AC
  Filled 2016-07-29: qty 2

## 2016-07-29 MED ORDER — DEXAMETHASONE SODIUM PHOSPHATE 10 MG/ML IJ SOLN
INTRAMUSCULAR | Status: AC
Start: 2016-07-29 — End: 2016-07-29
  Filled 2016-07-29: qty 1

## 2016-07-29 MED ORDER — LIDOCAINE 2% (20 MG/ML) 5 ML SYRINGE
INTRAMUSCULAR | Status: DC | PRN
  Administered 2016-07-29: 60 mg via INTRAVENOUS

## 2016-07-29 MED ORDER — EPHEDRINE SULFATE-NACL 50-0.9 MG/10ML-% IV SOSY
PREFILLED_SYRINGE | INTRAVENOUS | Status: DC | PRN
  Administered 2016-07-29: 10 mg via INTRAVENOUS

## 2016-07-29 MED ORDER — DEXAMETHASONE SODIUM PHOSPHATE 4 MG/ML IJ SOLN
INTRAMUSCULAR | Status: DC | PRN
  Administered 2016-07-29: 10 mg via INTRAVENOUS

## 2016-07-29 MED ORDER — LACTATED RINGERS IV SOLN
INTRAVENOUS | Status: DC
  Administered 2016-07-29 (×2): via INTRAVENOUS
  Filled 2016-07-29: qty 1000

## 2016-07-29 MED ORDER — ONDANSETRON HCL 4 MG/2ML IJ SOLN
INTRAMUSCULAR | Status: AC
Start: 2016-07-29 — End: 2016-07-29
  Filled 2016-07-29: qty 2

## 2016-07-29 MED ORDER — FENTANYL CITRATE (PF) 100 MCG/2ML IJ SOLN
INTRAMUSCULAR | Status: DC | PRN
  Administered 2016-07-29: 50 ug via INTRAVENOUS
  Administered 2016-07-29 (×2): 25 ug via INTRAVENOUS

## 2016-07-29 MED ORDER — PROPOFOL 10 MG/ML IV BOLUS
INTRAVENOUS | Status: DC | PRN
  Administered 2016-07-29: 100 mg via INTRAVENOUS
  Administered 2016-07-29: 180 mg via INTRAVENOUS

## 2016-07-29 MED ORDER — OXYCODONE HCL 5 MG/5ML PO SOLN
5.0000 mg | Freq: Once | ORAL | Status: AC | PRN
  Filled 2016-07-29: qty 5

## 2016-07-29 MED ORDER — PROPOFOL 10 MG/ML IV BOLUS
INTRAVENOUS | Status: AC
  Filled 2016-07-29: qty 20

## 2016-07-29 MED ORDER — MIDAZOLAM HCL 5 MG/5ML IJ SOLN
INTRAMUSCULAR | Status: DC | PRN
  Administered 2016-07-29: 2 mg via INTRAVENOUS

## 2016-07-29 MED ORDER — ONDANSETRON HCL 4 MG/2ML IJ SOLN
INTRAMUSCULAR | Status: DC | PRN
  Administered 2016-07-29: 4 mg via INTRAVENOUS

## 2016-07-29 MED ORDER — OXYCODONE HCL 5 MG PO TABS
5.0000 mg | ORAL_TABLET | Freq: Once | ORAL | Status: AC | PRN
Start: 2016-07-29 — End: 2016-07-29
  Administered 2016-07-29: 5 mg via ORAL
  Filled 2016-07-29: qty 1

## 2016-07-29 MED ORDER — MIDAZOLAM HCL 2 MG/2ML IJ SOLN
INTRAMUSCULAR | Status: AC
  Filled 2016-07-29: qty 2

## 2016-07-29 MED ORDER — CIPROFLOXACIN IN D5W 400 MG/200ML IV SOLN
400.0000 mg | Freq: Once | INTRAVENOUS | Status: AC
  Administered 2016-07-29: 400 mg via INTRAVENOUS
  Filled 2016-07-29: qty 200

## 2016-07-29 MED ORDER — SODIUM CHLORIDE 0.9 % IR SOLN
Status: DC | PRN
  Administered 2016-07-29: 21000 mL

## 2016-07-29 MED ORDER — PROMETHAZINE HCL 25 MG/ML IJ SOLN
6.2500 mg | INTRAMUSCULAR | Status: DC | PRN
  Filled 2016-07-29: qty 1

## 2016-07-29 MED ORDER — LIDOCAINE 2% (20 MG/ML) 5 ML SYRINGE
INTRAMUSCULAR | Status: AC
Start: 2016-07-29 — End: 2016-07-29
  Filled 2016-07-29: qty 5

## 2016-07-29 MED ORDER — HYDROMORPHONE HCL 1 MG/ML IJ SOLN
0.2500 mg | INTRAMUSCULAR | Status: DC | PRN
  Filled 2016-07-29: qty 0.5

## 2016-07-29 MED ORDER — BELLADONNA ALKALOIDS-OPIUM 16.2-60 MG RE SUPP
RECTAL | Status: AC
  Filled 2016-07-29: qty 1

## 2016-07-29 MED ORDER — LACTATED RINGERS IV SOLN
INTRAVENOUS | Status: DC
  Filled 2016-07-29: qty 1000

## 2016-07-29 MED ORDER — CIPROFLOXACIN IN D5W 400 MG/200ML IV SOLN
INTRAVENOUS | Status: AC
  Filled 2016-07-29: qty 200

## 2016-07-29 MED ORDER — LIDOCAINE HCL 2 % EX GEL
CUTANEOUS | Status: AC
  Filled 2016-07-29: qty 5

## 2016-07-29 MED ORDER — HYDROCODONE-ACETAMINOPHEN 5-325 MG PO TABS: 1.0000 | ORAL_TABLET | ORAL | 0 refills | Status: DC | PRN

## 2016-07-29 MED ORDER — GENTAMICIN SULFATE 40 MG/ML IJ SOLN
5.0000 mg/kg | INTRAVENOUS | Status: DC
  Filled 2016-07-29: qty 11.25

## 2016-07-29 MED ORDER — OXYCODONE HCL 5 MG PO TABS
ORAL_TABLET | ORAL | Status: AC
  Filled 2016-07-29: qty 1

## 2016-07-29 MED FILL — HYDROCODON-APAP 5-325: 5-325 | 3 days supply | Qty: 30 | Fill #0

## 2016-07-29 SURGICAL SUPPLY — 33 items
BAG DRAIN URO-CYSTO SKYTR STRL (DRAIN) ×3 IMPLANT
BAG DRN UROCATH (DRAIN) ×1
BAG URINE DRAINAGE (UROLOGICAL SUPPLIES) ×3 IMPLANT
CATH COUDE FOLEY 2W 5CC 18FR (CATHETERS) IMPLANT
CATH FOLEY 2WAY SLVR  5CC 18FR (CATHETERS)
CATH FOLEY 2WAY SLVR 30CC 20FR (CATHETERS) IMPLANT
CATH FOLEY 2WAY SLVR 5CC 18FR (CATHETERS) IMPLANT
CATH FOLEY 3WAY 30CC 22F (CATHETERS) ×2 IMPLANT
CLOTH BEACON ORANGE TIMEOUT ST (SAFETY) ×3 IMPLANT
ELECT BIVAP BIPO 22/24 DONUT (ELECTROSURGICAL)
ELECT LOOP MED HF 24F 12D (CUTTING LOOP) IMPLANT
ELECTRD BIVAP BIPO 22/24 DONUT (ELECTROSURGICAL) IMPLANT
GLOVE BIO SURGEON STRL SZ 6.5 (GLOVE) ×2 IMPLANT
GLOVE BIO SURGEON STRL SZ7.5 (GLOVE) ×3 IMPLANT
GLOVE BIO SURGEONS STRL SZ 6.5 (GLOVE) ×2
GLOVE BIOGEL PI IND STRL 6.5 (GLOVE) IMPLANT
GLOVE BIOGEL PI INDICATOR 6.5 (GLOVE) ×4
GOWN STRL REUS W/ TWL XL LVL3 (GOWN DISPOSABLE) ×1 IMPLANT
GOWN STRL REUS W/TWL LRG LVL3 (GOWN DISPOSABLE) ×4 IMPLANT
GOWN STRL REUS W/TWL XL LVL3 (GOWN DISPOSABLE)
HOLDER FOLEY CATH W/STRAP (MISCELLANEOUS) ×2 IMPLANT
IV NS IRRIG 3000ML ARTHROMATIC (IV SOLUTION) ×15 IMPLANT
IV SET EXTENSION GRAVITY 40 LF (IV SETS) ×3 IMPLANT
KIT RM TURNOVER CYSTO AR (KITS) ×3 IMPLANT
LASER REVOLIX PROCEDURE (MISCELLANEOUS) ×3 IMPLANT
LOOP CUT BIPOLAR 24F LRG (ELECTROSURGICAL) IMPLANT
MANIFOLD NEPTUNE II (INSTRUMENTS) ×2 IMPLANT
PACK CYSTO (CUSTOM PROCEDURE TRAY) ×3 IMPLANT
PLUG CATH AND CAP STER (CATHETERS) ×2 IMPLANT
SYR 30ML LL (SYRINGE) IMPLANT
SYRINGE IRR TOOMEY STRL 70CC (SYRINGE) ×2 IMPLANT
TUBE CONNECTING 12'X1/4 (SUCTIONS)
TUBE CONNECTING 12X1/4 (SUCTIONS) IMPLANT

## 2016-07-29 NOTE — Transfer of Care (Signed)
Immediate Anesthesia Transfer of Care Note  Patient: Gerald [name]  Procedure(s) Performed: Procedure(s) (LRB): THULIUM LASER TURP (TRANSURETHRAL RESECTION OF PROSTATE)/ TRANSRECTAL ULTRASOUND GUIDED PROSTATE BIOPSY (N/A)  Patient Location: PACU  Anesthesia Type: General  Level of Consciousness: awake, oriented, sedated and patient cooperative  Airway & Oxygen Therapy: Patient Spontanous Breathing and Patient connected to face mask oxygen  Post-op Assessment: Report given to PACU RN and Post -op Vital signs reviewed and stable  Post vital signs: Reviewed and stable  Complications: No apparent anesthesia complications Last Vitals:  Vitals:   07/29/16 1058 07/29/16 1606  BP: 140/77 134/89  Pulse: 68 98  Resp: 16 (!) 22  Temp: 36.8 C (!) 36.1 C    Last Pain:  Vitals:   07/29/16 1606  TempSrc:   PainSc: Asleep

## 2016-07-29 NOTE — Anesthesia Preprocedure Evaluation (Addendum)
Anesthesia Evaluation  Patient identified by MRN, date of birth, ID band Patient awake    Reviewed: Allergy & Precautions, NPO status , Patient's Chart, lab work & pertinent test results  Airway Mallampati: II  TM Distance: >3 FB Neck ROM: Full    Dental  (+) Teeth Intact, Dental Advisory Given   Pulmonary sleep apnea , former smoker,    breath sounds clear to auscultation       Cardiovascular hypertension, + dysrhythmias  Rhythm:Regular Rate:Normal     Neuro/Psych PSYCHIATRIC DISORDERS Depression  Neuromuscular disease    GI/Hepatic Neg liver ROS, hiatal hernia, GERD  ,  Endo/Other  negative endocrine ROS  Renal/GU negative Renal ROS  negative genitourinary   Musculoskeletal negative musculoskeletal ROS (+)   Abdominal   Peds negative pediatric ROS (+)  Hematology negative hematology ROS (+)   Anesthesia Other Findings - HLD   Reproductive/Obstetrics negative OB ROS                            Lab Results  Component Value Date   WBC 7.0 07/22/2016   HGB 14.0 07/22/2016   HCT 40.0 07/22/2016   MCV 90.3 07/22/2016   PLT 140 (L) 07/22/2016   Lab Results  Component Value Date   CREATININE 1.06 07/22/2016   BUN 24 (H) 07/22/2016   NA 138 07/22/2016   K 4.0 07/22/2016   CL 104 07/22/2016   CO2 27 07/22/2016   No results found for: INR, PROTIME  EKG 2018: sinus tachycardia.  Echo 2017: - Left ventricle: The cavity size was normal. Wall thickness was   increased in a pattern of moderate LVH. Systolic function was   normal. The estimated ejection fraction was in the range of 60%   to 65%. Wall motion was normal; there were no regional wall   motion abnormalities. Doppler parameters are consistent with   abnormal left ventricular relaxation (grade 1 diastolic   dysfunction). - Aortic valve: There was trivial regurgitation. - Atrial septum: No defect or patent foramen ovale was  identified.  Nuclear Stress Test 2017:  Nuclear stress EF: 56%.  The left ventricular ejection fraction is normal (55-65%).  There was no ST segment deviation noted during stress.  The study is normal.    Anesthesia Physical Anesthesia Plan  ASA: II  Anesthesia Plan: General   Post-op Pain Management:    Induction: Intravenous  Airway Management Planned: LMA  Additional Equipment:   Intra-op Plan:   Post-operative Plan: Extubation in OR  Informed Consent: I have reviewed the patients History and Physical, chart, labs and discussed the procedure including the risks, benefits and alternatives for the proposed anesthesia with the patient or authorized representative who has indicated his/her understanding and acceptance.   Dental advisory given  Plan Discussed with: CRNA  Anesthesia Plan Comments:        Anesthesia Quick Evaluation

## 2016-07-29 NOTE — Anesthesia Procedure Notes (Signed)
Procedure Name: LMA Insertion Date/Time: 07/29/2016 2:29 PM Performed by: Denna Haggard D Pre-anesthesia Checklist: Patient identified, Emergency Drugs available, Suction available and Patient being monitored Patient Re-evaluated:Patient Re-evaluated prior to inductionOxygen Delivery Method: Circle system utilized Preoxygenation: Pre-oxygenation with 100% oxygen Intubation Type: IV induction Ventilation: Mask ventilation without difficulty LMA: LMA inserted LMA Size: 4.0 Number of attempts: 1 Airway Equipment and Method: Bite block Placement Confirmation: positive ETCO2 Tube secured with: Tape Dental Injury: Teeth and Oropharynx as per pre-operative assessment

## 2016-07-29 NOTE — H&P (Signed)
CC: BPH  HPI: Gerald [name] is a 71 year-old male established patient who is here for follow up regarding further evaluation of BPH and lower urinary tract symptoms.  Patient is currently treated with flomax BID and finasteride for his symptoms. The patient states if he were to spend the rest of his life with his current urinary condition, he would be pleased.   on flomax BID and finasteride. Developed retention and failed TOV. Admitted to hospital one day after TOV for retention of 600 cc and sepsis. Has since failed two trials of void. Recent DRE was 2+, benign.     CC: Elevated PSA-Established Patient  HPI: His last PSA was performed 04/29/2016. The last PSA value was 4.77. The patient states he does take 5 alpha reductase inhibitor medication.   Patient does not have a family history of prostate cancer. He has had a prostate biopsy done.   Gerald [name] returns today for a 6 mo f/u. He has a history of elevated PSA with a negative prostate biopsy in August 2009 for PSA of 4.08. He had a negative PCA3 in February 2014. PSA has been between 5.9 and 7.3. It is now risen to 8.19. It was 3.57 in June 2017 though the patient was started on finasteride in interim. Adjust PSA is 7. His PSA was 4.77 in January 2018 (Adjusted is 9.5).   Was scheduled for prostate biopsy but that was put on hold 2/2 retention.      AUA Symptom Score: He never has the sensation of not emptying his bladder completely after finishing urinating. He never has to urinate again less that two hours after he has finished urinating. He does not have to stop and start again several times when he urinates. Less than 50% of the time he finds it difficult to postpone urination. Less than 50% of the time he has a weak urinary stream. He never has to push or strain to begin urination. He has to get up to urinate 3 times from the time he goes to bed until the time he gets up in the morning.   Calculated AUA Symptom Score: 7     ALLERGIES: Penicillins    MEDICATIONS: Flomax 0.4 mg capsule, ext release 24 hr 2 capsule PO Q HS  Levaquin 500 mg tablet 1 tablet PO AM day before procedure, AM day of procedure, AM d  Aspirin 81 MG TABS Oral  Benicar 40 MG Oral Tablet Oral  Bystolic 20 mg tablet Oral  Finasteride 5 mg tablet 1 tablet PO Daily  Multi Vitamin/Minerals TABS Oral  Prostate Oral Tablet 0 Oral  Tamsulosin HCl - 0.4 MG Oral Capsule 0 capsule Oral     GU PSH: No GU PSH      PSH Notes: Arm Incision, Oral Surgery, Tonsillectomy   NON-GU PSH: Remove Tonsils - 2008    GU PMH: BPH w/o LUTS - 06/16/2016, - 05/06/2016 (Stable), - 11/06/2015, - 11/06/2015, Enlarged prostate without lower urinary tract symptoms (luts), - 2015 Elevated PSA - 06/16/2016, (Stable), - 06/16/2016, - 05/06/2016 (Stable), - 11/06/2015, - 09/24/2015, Elevated prostate specific antigen (PSA), - 06/18/2015 Urinary Retention - 06/05/2016, - 05/29/2016 BPH w/LUTS - 09/24/2015, Benign localized prostatic hyperplasia with lower urinary tract symptoms (LUTS), - 06/18/2015 Nocturia, Nocturia - 12/07/2014 Urinary Frequency, Urinary frequency - 12/07/2014 Urinary Urgency, Urinary urgency - 12/07/2014 Polyuria, Polyuria - 2014 Splitting of Stream, Intermittent urinary stream - 2014    NON-GU PMH: Encounter for general adult medical examination without abnormal findings, Encounter  for preventive health examination - 06/18/2015 Personal history of other specified conditions, History of heartburn - 2014    FAMILY HISTORY: Family Health Status Number - Runs In Family No pertinent family history - Other   SOCIAL HISTORY: Marital Status: Married Current Smoking Status: Patient does not smoke anymore. Has not smoked since 08/30/1995.  Has never drank.  Does not drink caffeine. Patient's occupation is/was Retired [employer].     Notes: Former smoker, Tobacco Use, Alcohol Use, Death In The Family Mother, Caffeine Use, Death In The Family Father, Marital History - Currently  Married   REVIEW OF SYSTEMS:    GU Review Male:   Patient denies frequent urination, hard to postpone urination, burning/ pain with urination, get up at night to urinate, leakage of urine, stream starts and stops, trouble starting your stream, have to strain to urinate , erection problems, and penile pain.  Gastrointestinal (Upper):   Patient denies nausea, vomiting, and indigestion/ heartburn.  Gastrointestinal (Lower):   Patient denies diarrhea and constipation.  Constitutional:   Patient denies fever, night sweats, weight loss, and fatigue.  Skin:   Patient denies skin rash/ lesion and itching.  Eyes:   Patient denies blurred vision and double vision.  Ears/ Nose/ Throat:   Patient denies sore throat and sinus problems.  Hematologic/Lymphatic:   Patient denies swollen glands and easy bruising.  Cardiovascular:   Patient denies leg swelling and chest pains.  Respiratory:   Patient denies shortness of breath and cough.  Endocrine:   Patient denies excessive thirst.  Musculoskeletal:   Patient denies back pain and joint pain.  Neurological:   Patient denies headaches and dizziness.  Psychologic:   Patient denies depression and anxiety.   VITAL SIGNS: None   MULTI-SYSTEM PHYSICAL EXAMINATION:    Constitutional: Well-nourished. No physical deformities. Normally developed. Good grooming.  Neck: Neck symmetrical, not swollen. Normal tracheal position.  Respiratory: No labored breathing, no use of accessory muscles.   Cardiovascular: Normal temperature, normal extremity pulses, no swelling, no varicosities.  Skin: No paleness, no jaundice, no cyanosis. No lesion, no ulcer, no rash.  Gastrointestinal: No mass, no tenderness, no rigidity, non obese abdomen.  Eyes: Normal conjunctivae. Normal eyelids.  Ears, Nose, Mouth, and Throat: Left ear no scars, no lesions, no masses. Right ear no scars, no lesions, no masses. Nose no scars, no lesions, no masses. Normal hearing. Normal lips.   Musculoskeletal: Normal gait and station of head and neck.     PAST DATA REVIEWED:  Source Of History:  Patient   04/29/16 09/17/15 06/13/15 12/01/14 05/26/14 11/25/13 05/23/13 11/15/12  PSA  Total PSA 4.77 ng/dl 3.57  8.19  6.77  5.75  6.30  6.47  5.96   Free PSA 0.58 ng/dl  1.75  1.25  1.26  1.39  1.57  1.22   % Free PSA 12 %  21  18  22  22  24  20      PROCEDURES:          Urinalysis w/Scope Dipstick Dipstick Cont'd Micro  Color: Straw Bilirubin: Neg WBC/hpf: 0 - 5/hpf  Appearance: Cloudy Ketones: Neg RBC/hpf: NS (Not Seen)  Specific Gravity: 1.010 Blood: Trace Bacteria: NS (Not Seen)  pH: 6.0 Protein: Neg Cystals: NS (Not Seen)  Glucose: Neg Urobilinogen: 0.2 Casts: NS (Not Seen)    Nitrites: Neg Trichomonas: Not Present    Leukocyte Esterase: Neg Mucous: Not Present      Epithelial Cells: 0 - 5/hpf      Yeast: NS (  Not Seen)      Sperm: Not Present    ASSESSMENT:      ICD-10 Details  1 GU:   Elevated PSA - R97.20   2   BPH w/LUTS - N40.1   3   Urinary Retention - R33.8 Stable   PLAN:            Medications New Meds: Levaquin 500 mg tablet 1 tablet PO Daily   #7  0 Refill(s)            Schedule Procedure: Unspecified Date - Laser Surgery Prostate - [phone]          Document Letter(s):  Created for Patient: Clinical Summary   Created for Earlie Counts         Notes:   1. BPH with retention  The patient has failed multiple trials of void at this point. We discussed definitive management via TURP versus laser ablation. We discussed the risks and benefits of both approaches. He does understand that a TURP would provide tissue for pathology which may help with his elevated PSA. We however discussed that prostate cancer is very uncommon in the transitional zone and would likely be a little yield. We discussed the risks and benefits of both approaches. He understands the risks include but are not limited to bleeding, infection, stricture, persistent retention  status post surgery, prostate regrowth, and iatrogenic injury. After careful consideration, the patient has elected to undergo thullium laser ablation of his prostate.   I will start him on CIC at this point. He'll measure the findings. If remain under 250 cc he can stop CIC unless he feels like his retention. If his PVR volumes are greater than 250, he will continue this through surgery.   I have given him a prescription for Levaquin to start 5 days prior to surgery as he will likely be performing CIC at that time. We'll also send his urine for culture closer to the time surgery.   2. Elevated PSA  -will plan for prostate biopsy at time of above

## 2016-07-29 NOTE — Discharge Instructions (Signed)
Transurethral Resection of the Prostate, Care After Refer to this sheet in the next few weeks. These instructions provide you with information about caring for yourself after your procedure. Your health care provider may also give you more specific instructions. Your treatment has been planned according to current medical practices, but problems sometimes occur. Call your health care provider if you have any problems or questions after your procedure. What can I expect after the procedure? After the procedure, it is common to have:  Mild pain in your lower abdomen.  Soreness or mild discomfort in your penis from having the catheter inserted during the procedure.  A feeling of urgency when you need to urinate.  A small amount of blood in your urine. You may notice some small blood clots in your urine. These are normal. Follow these instructions at home: Medicines    Take over-the-counter and prescription medicines only as told by your health care provider.  Do not drive or operate heavy machinery while taking prescription pain medicine.  Do not drive for 24 hours if you received a sedative.  If you were prescribed antibiotic medicine, take it as told by your health care provider. Do not stop taking the antibiotic even if you start to feel better. Activity   Return to your normal activities as told by your health care provider. Ask your health care provider what activities are safe for you.  Do not lift anything that is heavier than 10 lb (4.5 kg) for 3 weeks after your procedure, or as long as told by your health care provider.  Avoid intense physical activity for as long as told by your health care provider.  Walk at least one time every day. This helps to prevent blood clots. You may increase your physical activity gradually as you start to feel better. Lifestyle   Do not drink alcohol for as long as told by your health care provider. This is especially important if you are taking  prescription pain medicines.  Do not engage in sexual activity until your health care provider says that you can do this. General instructions   Do not take baths, swim, or use a hot tub until your health care provider approves.  Drink enough fluid to keep your urine clear or pale yellow.  Urinate as soon as you feel the need to. Do not try to hold your urine for long periods of time.  If your health care provider approves, you may take a stool softener for 2-3 weeks to prevent you from straining to have a bowel movement.  Wear compression stockings as told by your health care provider. These stockings help to prevent blood clots and reduce swelling in your legs.  Keep all follow-up visits as told by your health care provider. This is important. Contact a health care provider if:  You have difficulty urinating.  You have a fever.  You have pain that gets worse or does not improve with medicine.  You have blood in your urine that does not go away after 1 week of resting and drinking more fluids.  You have swelling in your penis or testicles. Get help right away if:  You are unable to urinate.  You are having more blood clots in your urine instead of fewer.  You have:  Large blood clots.  A lot of blood in your urine.  Pain in your back or lower abdomen.  Pain or swelling in your legs.  Chills and you are shaking. This information is  not intended to replace advice given to you by your health care provider. Make sure you discuss any questions you have with your health care provider. Document Released: 03/17/2005 Document Revised: 11/18/2015 Document Reviewed: 12/07/2014 Elsevier Interactive Patient Education  2017 [employer]   Indwelling Urinary Catheter Care, Adult Take good care of your catheter to keep it working and to prevent problems. How to wear your catheter Attach your catheter to your leg with tape (adhesive tape) or a leg strap. Make sure it is not too  tight. If you use tape, remove any bits of tape that are already on the catheter. How to wear a drainage bag You should have:  A large overnight bag.  A small leg bag. Overnight Bag  You may wear the overnight bag at any time. Always keep the bag below the level of your bladder but off the floor. When you sleep, put a clean plastic bag in a wastebasket. Then hang the bag inside the wastebasket. Leg Bag  Never wear the leg bag at night. Always wear the leg bag below your knee. Keep the leg bag secure with a leg strap or tape. How to care for your skin  Clean the skin around the catheter at least once every day.  Shower every day. Do not take baths.  Put creams, lotions, or ointments on your genital area only as told by your doctor.  Do not use powders, sprays, or lotions on your genital area. How to clean your catheter and your skin 1. Wash your hands with soap and water. 2. Wet a washcloth in warm water and gentle (mild) soap. 3. Use the washcloth to clean the skin where the catheter enters your body. Clean downward and wipe away from the catheter in small circles. Do not wipe toward the catheter. 4. Pat the area dry with a clean towel. Make sure to clean off all soap. How to care for your drainage bags Empty your drainage bag when it is ?- full or at least 2-3 times a day. Replace your drainage bag once a month or sooner if it starts to smell bad or look dirty. Do not clean your drainage bag unless told by your doctor. Emptying a drainage bag   Supplies Needed  Rubbing alcohol.  Gauze pad or cotton ball.  Tape or a leg strap. Steps 1. Wash your hands with soap and water. 2. Separate (detach) the bag from your leg. 3. Hold the bag over the toilet or a clean container. Keep the bag below your hips and bladder. This stops pee (urine) from going back into the tube. 4. Open the pour spout at the bottom of the bag. 5. Empty the pee into the toilet or container. Do not let the  pour spout touch any surface. 6. Put rubbing alcohol on a gauze pad or cotton ball. 7. Use the gauze pad or cotton ball to clean the pour spout. 8. Close the pour spout. 9. Attach the bag to your leg with tape or a leg strap. 10. Wash your hands. Changing a drainage bag  Supplies Needed  Alcohol wipes.  A clean drainage bag.  Adhesive tape or a leg strap. Steps 1. Wash your hands with soap and water. 2. Separate the dirty bag from your leg. 3. Pinch the rubber catheter with your fingers so that pee does not spill out. 4. Separate the catheter tube from the drainage tube where these tubes connect (at the connection valve). Do not let the tubes touch  any surface. 5. Clean the end of the catheter tube with an alcohol wipe. Use a different alcohol wipe to clean the end of the drainage tube. 6. Connect the catheter tube to the drainage tube of the clean bag. 7. Attach the new bag to the leg with adhesive tape or a leg strap. 8. Wash your hands. How to prevent infection and other problems  Never pull on your catheter or try to remove it. Pulling can damage tissue in your body.  Always wash your hands before and after touching your catheter.  If a leg strap gets wet, replace it with a dry one.  Drink enough fluids to keep your pee clear or pale yellow, or as told by your doctor.  Do not let the drainage bag or tubing touch the floor.  Wear cotton underwear.  If you are male, wipe from front to back after you poop (have a bowel movement).  Check on the catheter often to make sure it works and the tubing is not twisted. Get help if:  Your pee is cloudy.  Your pee smells unusually bad.  Your pee is not draining into the bag.  Your tube gets clogged.  Your catheter starts to leak.  Your bladder feels full. Get help right away if:  You have redness, swelling, or pain where the catheter enters your body.  You have fluid, pus, or a bad smell coming from the area where the  catheter enters your body.  The area where the catheter enters your body feels warm.  You have a fever.  You have pain in your:  Stomach (abdomen).  Legs.  Lower back.  Bladder.  You see blood fill the catheter.  Your pee is pink or red.  You feel sick to your stomach (nauseous).  You throw up (vomit).  You have chills.  Your catheter gets pulled out. This information is not intended to replace advice given to you by your health care provider. Make sure you discuss any questions you have with your health care provider. Document Released: 07/12/2012 Document Revised: 02/13/2016 Document Reviewed: 08/30/2013 Elsevier Interactive Patient Education  2017 [facility] Instructions  Activity: Get plenty of rest for the remainder of the day. A responsible individual must stay with you for 24 hours following the procedure.  For the next 24 hours, DO NOT: -Drive a car -Paediatric nurse -Drink alcoholic beverages -Take any medication unless instructed by your physician -Make any legal decisions or sign important papers.  Meals: Start with liquid foods such as gelatin or soup. Progress to regular foods as tolerated. Avoid greasy, spicy, heavy foods. If nausea and/or vomiting occur, drink only clear liquids until the nausea and/or vomiting subsides. Call your physician if vomiting continues.  Special Instructions/Symptoms: Your throat may feel dry or sore from the anesthesia or the breathing tube placed in your throat during surgery. If this causes discomfort, gargle with warm salt water. The discomfort should disappear within 24 hours.  If you had a scopolamine patch placed behind your ear for the management of post- operative nausea and/or vomiting:  1. The medication in the patch is effective for 72 hours, after which it should be removed.  Wrap patch in a tissue and discard in the trash. Wash hands thoroughly with soap and water. 2. You may  remove the patch earlier than 72 hours if you experience unpleasant side effects which may include dry mouth, dizziness or visual disturbances. 3. Avoid touching the patch.  Wash your hands with soap and water after contact with the patch.

## 2016-07-29 NOTE — Anesthesia Procedure Notes (Signed)
Procedure Name: LMA Insertion Date/Time: 07/29/2016 3:46 PM Performed by: Denna Haggard D Pre-anesthesia Checklist: Patient identified, Emergency Drugs available, Suction available and Patient being monitored Patient Re-evaluated:Patient Re-evaluated prior to inductionOxygen Delivery Method: Circle system utilized Preoxygenation: Pre-oxygenation with 100% oxygen Intubation Type: IV induction Ventilation: Mask ventilation without difficulty LMA: LMA inserted LMA Size: 5.0 Number of attempts: 1 Airway Equipment and Method: Bite block Placement Confirmation: positive ETCO2 Tube secured with: Tape Dental Injury: Teeth and Oropharynx as per pre-operative assessment

## 2016-07-29 NOTE — Op Note (Signed)
Date of procedure: 07/29/16  Preoperative diagnosis:  1. BPH 2. History of urinary retention 3. Elevated PSA   Postoperative diagnosis:  1. BPH 2. History of urinary retention 3. Elevated PSA 4. Meatal stenosis   Procedure: 1. Transrectal ultrasound of prostate 2. Transrectal prostate biopsy 12 3. Thulium laser ablation of prostate  Surgeon: Baruch Gouty, MD  Anesthesia: General  Complications: None  Intraoperative findings: The patient had an approximately 120 cc on transrectal ultrasound. Every trilobar hypertrophy with a small median lobe and very significant lateral lobe hypertrophy. He was visually unobstructed at the end of the laser ablation.  EBL: None  Specimens: Prostate biopsy 12 cores  Drains: 22 French 3-way catheter dependent drainage with third port plugged.  Disposition: Stable to the postanesthesia care unit  Indication for procedure: The patient is a 71 y.o. male with history of urinary retention due to BPH who is failed medical management and presents for thulium laser ablation of his prostate. He is off CIC at this time but still significant lower urinary tract symptoms. He also has a history of an elevated PSA that rose slightly recently, so he is undergoing transrectal prostate biopsy while under anesthesia for his laser ablation..  After reviewing the management options for treatment, the patient elected to proceed with the above surgical procedure(s). We have discussed the potential benefits and risks of the procedure, side effects of the proposed treatment, the likelihood of the patient achieving the goals of the procedure, and any potential problems that might occur during the procedure or recuperation. Informed consent has been obtained.  Description of procedure: The patient was met in the preoperative area. All risks, benefits, and indications of the procedure were described in great detail. The patient consented to the procedure. Preoperative  antibiotics were given. The patient was taken to the operative theater. General anesthesia was induced per the anesthesia service. The patient was then placed in the dorsal lithotomy position. A preoperative timeout was called.   A transrectal ultrasound probe was placed. The prostate was imaged in relevant images were sent to PACS. He had 120 cc prostate on transrectal ultrasound was no hypoechoic areas. He was noted to have a large transition zone with a small median lobe. 12 cores were then obtained under ultrasound guidance the needle biopsy gun. 6 cores were taken from each side. From each side  cores taken from the medial/lateral base, medial/lateral mid, and medial/lateral apex.   The patient was then prepped and draped in the usual sterile fashion in the laser ablation portion of the case began. He was noted at this time to have meatal stenosis and was dilated to 28 [place] accommodate the cystoscope. A 24 French 30 cystoscope was inserted into the patient's bladder per urethra atraumatically. The patient was noted to have significant lateral lobe hypertrophy. His prosthetic length was approximately 5 cm. He had a small median lobe that was abutting the ureteral orifices. This lobe was partially resected with care to not come close to the ureteral orifice. A small portion the median lobe was left behind. Attention was then taken to the long visually obstructive lateral lobes. Ablation took place for approximately one hour. Due to the patient's large prostate size seen on transrectal ultrasound in the significant visual obstruction, complete ablation of the prostate was not feasible. A 360 ablation with the thulium laser took place and the patient was visibly unobstructed. Ablation alteplase proximal to the verumontanum. At this point, the obstruction of the lateral lobes had been relieved,  and the patient had a wide open channel throughout his prostate urethra. The decision was made to to end the  procedure at this point as the patient should be able to void well with the tissue that was ablated. At the end of the procedure, the verumontanum was verified to be intact. Both ureteral orifices were unaffected by the ablation as well. At this point, the cystoscope was withdrawn and a 22 French three-way Foley catheter with third port plugged was placed with 30 cc in the balloon. The patient irrigated this point with clear light pink urine. The patient was then awoken from anesthesia and transferred in stable condition to the post anesthesia care unit.  Plan: The patient will follow-up in a few days for a nurse visit for Foley removal. He'll see me in a week to go over his pathology from his prostate biopsy.  Baruch Gouty, M.D.

## 2016-07-30 NOTE — Anesthesia Postprocedure Evaluation (Addendum)
Anesthesia Post Note  Patient: Gerald [name]  Procedure(s) Performed: Procedure(s) (LRB): THULIUM LASER TURP (TRANSURETHRAL RESECTION OF PROSTATE)/ TRANSRECTAL ULTRASOUND GUIDED PROSTATE BIOPSY (N/A)  Patient location during evaluation: PACU Anesthesia Type: General Level of consciousness: awake and alert Pain management: pain level controlled Vital Signs Assessment: post-procedure vital signs reviewed and stable Respiratory status: spontaneous breathing, nonlabored ventilation, respiratory function stable and patient connected to nasal cannula oxygen Cardiovascular status: blood pressure returned to baseline and stable Postop Assessment: no signs of nausea or vomiting Anesthetic complications: no       Last Vitals:  Vitals:   07/29/16 1700 07/29/16 1810  BP: (!) 142/93 (!) 157/87  Pulse: 82 84  Resp: 13 14  Temp:  36.4 C    Last Pain:  Vitals:   07/29/16 1800  TempSrc:   PainSc: 4                  Kalista Laguardia S

## 2016-07-31 ENCOUNTER — Other Ambulatory Visit: Payer: Self-pay | Admitting: Family

## 2016-07-31 ENCOUNTER — Encounter (HOSPITAL_BASED_OUTPATIENT_CLINIC_OR_DEPARTMENT_OTHER): Payer: Self-pay | Admitting: Urology

## 2016-07-31 NOTE — Telephone Encounter (Signed)
Refill sent per [facility] refill protocol/SLS  

## 2016-08-04 DIAGNOSIS — N400 Enlarged prostate without lower urinary tract symptoms: Secondary | ICD-10-CM | POA: Diagnosis not present

## 2016-08-07 ENCOUNTER — Other Ambulatory Visit: Payer: Self-pay | Admitting: Family

## 2016-08-07 NOTE — Telephone Encounter (Signed)
Refill sent per [facility] refill protocol/SLS  

## 2016-08-14 DIAGNOSIS — L570 Actinic keratosis: Secondary | ICD-10-CM | POA: Diagnosis not present

## 2016-08-14 DIAGNOSIS — L218 Other seborrheic dermatitis: Secondary | ICD-10-CM | POA: Diagnosis not present

## 2016-08-14 DIAGNOSIS — L821 Other seborrheic keratosis: Secondary | ICD-10-CM | POA: Diagnosis not present

## 2016-08-14 DIAGNOSIS — L918 Other hypertrophic disorders of the skin: Secondary | ICD-10-CM | POA: Diagnosis not present

## 2016-08-14 DIAGNOSIS — Z85820 Personal history of malignant melanoma of skin: Secondary | ICD-10-CM | POA: Diagnosis not present

## 2016-08-29 ENCOUNTER — Other Ambulatory Visit: Payer: Self-pay | Admitting: Family

## 2016-09-01 NOTE — Addendum Note (Signed)
Addendum  created 09/01/16 1358 by Myrtie Soman, MD   Sign clinical note

## 2016-09-13 ENCOUNTER — Encounter: Payer: Self-pay | Admitting: Family

## 2016-09-17 ENCOUNTER — Encounter: Payer: Self-pay | Admitting: Family

## 2016-09-17 ENCOUNTER — Ambulatory Visit (INDEPENDENT_AMBULATORY_CARE_PROVIDER_SITE_OTHER): Payer: Medicare Other | Admitting: Family

## 2016-09-17 VITALS — BP 117/64 | HR 58 | Temp 98.7°F | Resp 16 | Ht 65.0 in | Wt 202.0 lb

## 2016-09-17 DIAGNOSIS — E785 Hyperlipidemia, unspecified: Secondary | ICD-10-CM | POA: Diagnosis not present

## 2016-09-17 DIAGNOSIS — N400 Enlarged prostate without lower urinary tract symptoms: Secondary | ICD-10-CM

## 2016-09-17 DIAGNOSIS — N401 Enlarged prostate with lower urinary tract symptoms: Secondary | ICD-10-CM | POA: Insufficient documentation

## 2016-09-17 DIAGNOSIS — I10 Essential (primary) hypertension: Secondary | ICD-10-CM | POA: Diagnosis not present

## 2016-09-17 HISTORY — DX: Benign prostatic hyperplasia without lower urinary tract symptoms: N40.0

## 2016-09-17 LAB — LIPID PANEL
CHOLESTEROL: 181 mg/dL (ref 0–200)
HDL: 33.4 mg/dL — AB (ref >39.00)
NonHDL: 147.55
Total CHOL/HDL Ratio: 5
Triglycerides: 334 mg/dL — ABNORMAL HIGH (ref 0.0–149.0)
VLDL: 66.8 mg/dL — ABNORMAL HIGH (ref 0.0–40.0)

## 2016-09-17 LAB — LDL CHOLESTEROL, DIRECT: LDL DIRECT: 88 mg/dL

## 2016-09-17 NOTE — Assessment & Plan Note (Signed)
Repeat lipid panel, continue fish oil.

## 2016-09-17 NOTE — Assessment & Plan Note (Addendum)
BP stable on current medications.

## 2016-09-17 NOTE — Assessment & Plan Note (Signed)
Stable on proscar/flomax, management per urology.

## 2016-09-17 NOTE — Patient Instructions (Signed)
Please complete lab work prior to leaving.   

## 2016-09-17 NOTE — Progress Notes (Signed)
Subjective:    Patient ID: Gerald [name], male    DOB: 01-29-1946, 71 y.o.   MRN: [id number]  HPI  Gerald [name] is a 71 yr old male who presents today for follow up.  1) Hyperlipidemia-  Lab Results  Component Value Date   CHOL 200 10/03/2015   HDL 37.30 (L) 10/03/2015   LDLCALC 135 (H) 10/03/2015   LDLDIRECT 140.0 08/29/2010   TRIG 137.0 10/03/2015   CHOLHDL 5 10/03/2015   2) Hypertension- continues metoprolol and losartan.  BP Readings from Last 3 Encounters:  09/17/16 117/64  07/29/16 (!) 157/87  06/18/16 138/69   3) BPH- reports well controlled on proscar/flomax managed by urology. He is s/p TURP 07/29/16   Review of Systems    see HPI  Past Medical History:  Diagnosis Date  . Back pain, chronic   . Benign localized prostatic hyperplasia with lower urinary tract symptoms (LUTS)   . Depression   . Diverticulosis of colon   . Elevated PSA   . Fatty liver   . GERD (gastroesophageal reflux disease)   . Hiatal hernia   . History of alcohol abuse    quit 1989  . History of esophageal stricture    2003  S/P  DILATATION  . History of esophagitis   . History of melanoma in situ    06/ 2017  REMOVAL RIGHT CHEEK AREA  . History of sepsis 06/06/2016   admitted for urosepsis , UTI due to pseudomonas-- resolved   . Hx of adenomatous colonic polyps    2003   TUBULAR ADENOMA  . Hyperlipidemia   . Hypertension   . Mild sleep apnea    2004 mild, per sleep study-- no cpap recommended  (pt denies)  . Urinary retention      Social History   Social History  . Marital status: Married    Spouse name: N/A  . Number of children: 2  . Years of education: N/A   Occupational History  . Retired    Social History Main Topics  . Smoking status: Former Smoker    Packs/day: 2.00    Years: 30.00    Types: Cigarettes    Quit date: 08/29/2006  . Smokeless tobacco: Never Used  . Alcohol use No  . Drug use: No  . Sexual activity: Not on file   Other Topics Concern  .  Not on file   Social History Narrative   Diet: regular, lots of vegetables-----exercise : none but active   Caffeine use:  5 cups coffee daily          Past Surgical History:  Procedure Laterality Date  . CARDIOVASCULAR STRESS TEST  04-17-2015  dr Tresa Endo   Normal stress nulcear study w/ a small, mild, fixed inferior defect consistent with inferior thinning; no ischemia; normal LV function and wall motion , stress ef 56%  . COLONOSCOPY  last one 12-02-2006  . EXCISION LEFT ARM MASS  1988  . MOHS SURGERY  09/06/2015   right parotidemomasseteric excision w/ complex repair for melanoma in situ  . THULIUM LASER TURP (TRANSURETHRAL RESECTION OF PROSTATE) N/A 07/29/2016   Procedure: Morton Peters LASER TURP (TRANSURETHRAL RESECTION OF PROSTATE)/ TRANSRECTAL ULTRASOUND GUIDED PROSTATE BIOPSY;  Surgeon: Hildred Laser, MD;  Location: [facility];  Service: Urology;  Laterality: N/A;  . TONSILLECTOMY AND ADENOIDECTOMY  child  . TRANSTHORACIC ECHOCARDIOGRAM  04/16/2015   moderate LVH,  ef 60-65%, grade 1 diastolic dysfunction/  trivial AR, MR and PR/  mild dilated ascending aorta/  mild TR  . UPPER GASTROINTESTINAL ENDOSCOPY  last one 02-15-2014    Family History  Problem Relation Age of Onset  . Coronary artery disease Father   . Colon cancer Father   . Heart attack Paternal Grandfather   . Coronary artery disease Maternal Grandmother   . Colon cancer Maternal Grandmother   . Cancer Maternal Grandmother        colon  . Melanoma Mother   . Coronary artery disease Paternal Grandmother   . Heart attack Maternal Grandfather   . Hypertension Maternal Aunt   . Prostate cancer Neg Hx   . Stroke Neg Hx   . Esophageal cancer Neg Hx   . Pancreatic cancer Neg Hx   . Rectal cancer Neg Hx   . Stomach cancer Neg Hx     Allergies  Allergen Reactions  . Augmentin [Amoxicillin-Pot Clavulanate] Diarrhea    *Can take plain Amoxicillin* Has patient had a PCN reaction causing immediate  rash, facial/tongue/throat swelling, SOB or lightheadedness with hypotension: No Has patient had a PCN reaction causing severe rash involving mucus membranes or skin necrosis: No Has patient had a PCN reaction that required hospitalization No Has patient had a PCN reaction occurring within the last 10 years: Yes If all of the above answers are "NO", then may proceed with Cephalosporin use.   Marland Kitchen Penicillins Rash    Childhood reaction *Can take Amoxicillin* Has patient had a PCN reaction causing immediate rash, facial/tongue/throat swelling, SOB or lightheadedness with hypotension: Unknown Has patient had a PCN reaction causing severe rash involving mucus membranes or skin necrosis: Unknown Has patient had a PCN reaction that required hospitalization: Unknown Has patient had a PCN reaction occurring within the last 10 years: Unknown  If all of the above answers are "NO", then may proceed with Cephalosporin use    Current Outpatient Prescriptions on File Prior to Visit  Medication Sig Dispense Refill  . aspirin EC 81 MG tablet Take 81 mg by mouth every other day.    . finasteride (PROSCAR) 5 MG tablet Take 5 mg by mouth every morning.   11  . HYDROcodone-acetaminophen (NORCO) 5-325 MG tablet Take 1-2 tablets by mouth every 4 (four) hours as needed for moderate pain. 30 tablet 0  . ketoconazole (NIZORAL) 2 % shampoo Apply 1 application topically as needed for irritation. For eczema    . losartan (COZAAR) 100 MG tablet TAKE 1 TABLET(100 MG) BY MOUTH DAILY 90 tablet 0  . metoprolol tartrate (LOPRESSOR) 25 MG tablet TAKE 1 TABLET BY MOUTH TWICE DAILY 1808 tablet 1  . Multiple Vitamins-Minerals (MULTIVITAMIN ADULTS 50+ PO) Take 1 tablet by mouth daily.    . Omega-3 Fatty Acids (FISH OIL) 1200 MG CAPS Take 1 capsule by mouth daily.    Marland Kitchen omeprazole (PRILOSEC) 20 MG capsule Take 1 capsule (20 mg total) by mouth daily. (Patient taking differently: Take 20 mg by mouth daily as needed. ) 30 capsule 3  .  tamsulosin (FLOMAX) 0.4 MG CAPS capsule Take 0.4 mg by mouth 2 (two) times daily.    . potassium chloride (K-DUR) 10 MEQ tablet Take 2 tablets (20 mEq total) by mouth daily. (Patient taking differently: Take 20 mEq by mouth every morning. ) 6 tablet 0   No current facility-administered medications on file prior to visit.     BP 117/64 (BP Location: Left Arm, Cuff Size: Large)   Pulse (!) 58   Temp 98.7 F (37.1 C) (Oral)  Resp 16   Ht 5\' 5"  (1.651 m)   Wt 202 lb (91.6 kg)   SpO2 97%   BMI 33.61 kg/m    Objective:   Physical Exam  Constitutional: He is oriented to person, place, and time. He appears well-developed and well-nourished. No distress.  HENT:  Head: Normocephalic and atraumatic.  Cardiovascular: Normal rate and regular rhythm.   No murmur heard. Pulmonary/Chest: Effort normal and breath sounds normal. No respiratory distress. He has no wheezes. He has no rales.  Musculoskeletal: He exhibits no edema.  Neurological: He is alert and oriented to person, place, and time.  Skin: Skin is warm and dry.  Psychiatric: He has a normal mood and affect. His behavior is normal. Thought content normal.          Assessment & Plan:

## 2016-09-17 NOTE — Assessment & Plan Note (Signed)
>>  ASSESSMENT AND PLAN FOR BPH (BENIGN PROSTATIC HYPERPLASIA) WRITTEN ON 09/17/2016 12:33 PM BY O'SULLIVAN, Sal Spratley, NP  Stable on proscar/flomax, management per urology.

## 2016-09-18 ENCOUNTER — Encounter: Payer: Self-pay | Admitting: Family

## 2016-09-18 ENCOUNTER — Other Ambulatory Visit: Payer: Self-pay | Admitting: Family

## 2016-09-18 MED ORDER — FISH OIL 1200 MG PO CAPS: 2.0000 | ORAL_CAPSULE | Freq: Two times a day (BID) | ORAL | Status: DC

## 2016-10-30 ENCOUNTER — Other Ambulatory Visit: Payer: Self-pay | Admitting: Family

## 2016-12-19 ENCOUNTER — Telehealth: Payer: Self-pay | Admitting: Family

## 2016-12-19 MED ORDER — ZOSTER VAC RECOMB ADJUVANTED 50 MCG/0.5ML IM SUSR: INTRAMUSCULAR | 1 refills | Status: DC

## 2016-12-19 NOTE — Telephone Encounter (Signed)
Wife requests rx for shingrix for patient.

## 2017-01-05 ENCOUNTER — Encounter: Payer: Self-pay | Admitting: Gastroenterology

## 2017-01-07 ENCOUNTER — Encounter: Payer: Self-pay | Admitting: Family

## 2017-01-07 ENCOUNTER — Encounter: Payer: Self-pay | Admitting: Gastroenterology

## 2017-01-26 DIAGNOSIS — R972 Elevated prostate specific antigen [PSA]: Secondary | ICD-10-CM | POA: Diagnosis not present

## 2017-01-29 ENCOUNTER — Encounter: Payer: Self-pay | Admitting: Family

## 2017-01-29 DIAGNOSIS — Z23 Encounter for immunization: Secondary | ICD-10-CM | POA: Diagnosis not present

## 2017-02-04 DIAGNOSIS — R972 Elevated prostate specific antigen [PSA]: Secondary | ICD-10-CM | POA: Diagnosis not present

## 2017-02-04 DIAGNOSIS — N400 Enlarged prostate without lower urinary tract symptoms: Secondary | ICD-10-CM | POA: Diagnosis not present

## 2017-02-09 ENCOUNTER — Encounter: Payer: Self-pay | Admitting: Family

## 2017-02-11 ENCOUNTER — Encounter: Payer: Self-pay | Admitting: Family

## 2017-02-12 DIAGNOSIS — Z85820 Personal history of malignant melanoma of skin: Secondary | ICD-10-CM | POA: Diagnosis not present

## 2017-02-12 DIAGNOSIS — D1801 Hemangioma of skin and subcutaneous tissue: Secondary | ICD-10-CM | POA: Diagnosis not present

## 2017-02-12 DIAGNOSIS — L218 Other seborrheic dermatitis: Secondary | ICD-10-CM | POA: Diagnosis not present

## 2017-02-12 DIAGNOSIS — L821 Other seborrheic keratosis: Secondary | ICD-10-CM | POA: Diagnosis not present

## 2017-02-12 DIAGNOSIS — L814 Other melanin hyperpigmentation: Secondary | ICD-10-CM | POA: Diagnosis not present

## 2017-02-12 DIAGNOSIS — L918 Other hypertrophic disorders of the skin: Secondary | ICD-10-CM | POA: Diagnosis not present

## 2017-02-15 ENCOUNTER — Other Ambulatory Visit: Payer: Self-pay | Admitting: Family

## 2017-02-16 NOTE — Telephone Encounter (Signed)
rx sent in pt notified on my chart

## 2017-03-10 ENCOUNTER — Other Ambulatory Visit: Payer: Self-pay | Admitting: Family

## 2017-03-13 ENCOUNTER — Ambulatory Visit (AMBULATORY_SURGERY_CENTER): Payer: Self-pay | Admitting: *Deleted

## 2017-03-13 ENCOUNTER — Other Ambulatory Visit: Payer: Self-pay

## 2017-03-13 ENCOUNTER — Encounter: Payer: Self-pay | Admitting: Physician Assistant

## 2017-03-13 VITALS — Ht 66.0 in | Wt 185.0 lb

## 2017-03-13 DIAGNOSIS — Z800 Family history of malignant neoplasm of digestive organs: Secondary | ICD-10-CM

## 2017-03-13 DIAGNOSIS — Z86010 Personal history of colonic polyps: Secondary | ICD-10-CM

## 2017-03-13 MED ORDER — NA SULFATE-K SULFATE-MG SULF 17.5-3.13-1.6 GM/177ML PO SOLN: ORAL | 0 refills | Status: DC

## 2017-03-13 NOTE — Progress Notes (Signed)
Patient denies any allergies to eggs or soy. Patient denies any problems with anesthesia/sedation. Patient denies any oxygen use at home. Patient denies taking any diet/weight loss medications or blood thinners. EMMI education assisgned to patient on colonoscopy, this was explained and instructions given to patient. 

## 2017-03-17 ENCOUNTER — Ambulatory Visit: Payer: Medicare Other | Admitting: Family

## 2017-03-17 ENCOUNTER — Encounter: Payer: Self-pay | Admitting: Family

## 2017-03-17 ENCOUNTER — Ambulatory Visit (INDEPENDENT_AMBULATORY_CARE_PROVIDER_SITE_OTHER): Payer: Medicare Other | Admitting: Family

## 2017-03-17 VITALS — BP 122/66 | HR 58 | Temp 97.4°F | Resp 16 | Ht 65.0 in | Wt 184.0 lb

## 2017-03-17 DIAGNOSIS — E785 Hyperlipidemia, unspecified: Secondary | ICD-10-CM | POA: Diagnosis not present

## 2017-03-17 DIAGNOSIS — I10 Essential (primary) hypertension: Secondary | ICD-10-CM | POA: Diagnosis not present

## 2017-03-17 LAB — COMPREHENSIVE METABOLIC PANEL
ALBUMIN: 4 g/dL (ref 3.5–5.2)
ALK PHOS: 73 U/L (ref 39–117)
ALT: 28 U/L (ref 0–53)
AST: 20 U/L (ref 0–37)
BILIRUBIN TOTAL: 0.7 mg/dL (ref 0.2–1.2)
BUN: 22 mg/dL (ref 6–23)
CALCIUM: 9.3 mg/dL (ref 8.4–10.5)
CO2: 31 mEq/L (ref 19–32)
Chloride: 104 mEq/L (ref 96–112)
Creatinine, Ser: 0.9 mg/dL (ref 0.40–1.50)
GFR: 88.54 mL/min (ref >60.00)
GLUCOSE: 88 mg/dL (ref 70–99)
Potassium: 4.4 mEq/L (ref 3.5–5.1)
Sodium: 141 mEq/L (ref 135–145)
TOTAL PROTEIN: 6.7 g/dL (ref 6.0–8.3)

## 2017-03-17 LAB — LIPID PANEL
CHOLESTEROL: 164 mg/dL (ref 0–200)
HDL: 40.4 mg/dL (ref >39.00)
LDL Cholesterol: 100 mg/dL — ABNORMAL HIGH (ref 0–99)
NONHDL: 124.05
TRIGLYCERIDES: 121 mg/dL (ref 0.0–149.0)
Total CHOL/HDL Ratio: 4
VLDL: 24.2 mg/dL (ref 0.0–40.0)

## 2017-03-17 NOTE — Progress Notes (Signed)
Subjective:    Patient ID: Gerald [name], male    DOB: June 17, 1945, 71 y.o.   MRN: [id number]  HPI  [name] is a 71 yr old male who presents today for follow up.  1) Hyperlipidemia-  Lab Results  Component Value Date   CHOL 181 09/17/2016   HDL 33.40 (L) 09/17/2016   LDLCALC 135 (H) 10/03/2015   LDLDIRECT 88.0 09/17/2016   TRIG 334.0 (H) 09/17/2016   CHOLHDL 5 09/17/2016   2) HTN- maintained on metoprolol  And losartan.  BP Readings from Last 3 Encounters:  03/17/17 122/66  09/17/16 117/64  07/29/16 (!) 157/87      Review of Systems See HPI  Past Medical History:  Diagnosis Date  . Back pain, chronic   . Benign localized prostatic hyperplasia with lower urinary tract symptoms (LUTS)   . Depression   . Diverticulosis of colon   . Elevated PSA   . Fatty liver   . GERD (gastroesophageal reflux disease)   . Hiatal hernia   . History of alcohol abuse    quit 1989  . History of esophageal stricture    2003  S/P  DILATATION  . History of esophagitis   . History of melanoma in situ    06/ 2017  REMOVAL RIGHT CHEEK AREA  . History of sepsis 06/06/2016   admitted for urosepsis , UTI due to pseudomonas-- resolved   . Hx of adenomatous colonic polyps    2003   TUBULAR ADENOMA  . Hyperlipidemia   . Hypertension   . Mild sleep apnea    2004 mild, per sleep study-- no cpap recommended  (pt denies)  . Urinary retention      Social History   Socioeconomic History  . Marital status: Married    Spouse name: Not on file  . Number of children: 2  . Years of education: Not on file  . Highest education level: Not on file  Social Needs  . Financial resource strain: Not on file  . Food insecurity - worry: Not on file  . Food insecurity - inability: Not on file  . Transportation needs - medical: Not on file  . Transportation needs - non-medical: Not on file  Occupational History  . Occupation: Retired  Tobacco Use  . Smoking status: Former Smoker   Packs/day: 2.00    Years: 30.00    Pack years: 60.00    Types: Cigarettes    Last attempt to quit: 08/29/2006    Years since quitting: 10.5  . Smokeless tobacco: Never Used  Substance and Sexual Activity  . Alcohol use: No  . Drug use: No  . Sexual activity: Not on file  Other Topics Concern  . Not on file  Social History Narrative   Diet: regular, lots of vegetables-----exercise : none but active   Caffeine use:  5 cups coffee daily          Past Surgical History:  Procedure Laterality Date  . CARDIOVASCULAR STRESS TEST  04-17-2015  dr Tresa Endo   Normal stress nulcear study w/ a small, mild, fixed inferior defect consistent with inferior thinning; no ischemia; normal LV function and wall motion , stress ef 56%  . COLONOSCOPY  last one 12-02-2006  . EXCISION LEFT ARM MASS  1988  . MOHS SURGERY  09/06/2015   right parotidemomasseteric excision w/ complex repair for melanoma in situ  . THULIUM LASER TURP (TRANSURETHRAL RESECTION OF PROSTATE) N/A 07/29/2016   Procedure: THULIUM LASER TURP (TRANSURETHRAL  RESECTION OF PROSTATE)/ TRANSRECTAL ULTRASOUND GUIDED PROSTATE BIOPSY;  Surgeon: Hildred Laser, MD;  Location: [facility];  Service: Urology;  Laterality: N/A;  . TONSILLECTOMY AND ADENOIDECTOMY  child  . TRANSTHORACIC ECHOCARDIOGRAM  04/16/2015   moderate LVH,  ef 60-65%, grade 1 diastolic dysfunction/  trivial AR, MR and PR/  mild dilated ascending aorta/  mild TR  . UPPER GASTROINTESTINAL ENDOSCOPY  last one 02-15-2014    Family History  Problem Relation Age of Onset  . Coronary artery disease Father   . Colon cancer Father        ? unknown age dx/died at 79  . Heart attack Paternal Grandfather   . Coronary artery disease Maternal Grandmother   . Colon cancer Maternal Grandmother 40  . Cancer Maternal Grandmother        colon  . Melanoma Mother   . Coronary artery disease Paternal Grandmother   . Heart attack Maternal Grandfather   . Hypertension  Maternal Aunt   . Prostate cancer Neg Hx   . Stroke Neg Hx   . Esophageal cancer Neg Hx   . Pancreatic cancer Neg Hx   . Rectal cancer Neg Hx   . Stomach cancer Neg Hx     Allergies  Allergen Reactions  . Augmentin [Amoxicillin-Pot Clavulanate] Diarrhea    *Can take plain Amoxicillin* Has patient had a PCN reaction causing immediate rash, facial/tongue/throat swelling, SOB or lightheadedness with hypotension: No Has patient had a PCN reaction causing severe rash involving mucus membranes or skin necrosis: No Has patient had a PCN reaction that required hospitalization No Has patient had a PCN reaction occurring within the last 10 years: Yes If all of the above answers are "NO", then may proceed with Cephalosporin use.   Marland Kitchen Penicillins Rash    Childhood reaction *Can take Amoxicillin* Has patient had a PCN reaction causing immediate rash, facial/tongue/throat swelling, SOB or lightheadedness with hypotension: Unknown Has patient had a PCN reaction causing severe rash involving mucus membranes or skin necrosis: Unknown Has patient had a PCN reaction that required hospitalization: Unknown Has patient had a PCN reaction occurring within the last 10 years: Unknown  If all of the above answers are "NO", then may proceed with Cephalosporin use    Current Outpatient Medications on File Prior to Visit  Medication Sig Dispense Refill  . aspirin EC 81 MG tablet Take 81 mg by mouth every other day.    . ketoconazole (NIZORAL) 2 % shampoo Apply 1 application topically as needed for irritation. For eczema    . losartan (COZAAR) 100 MG tablet TAKE 1 TABLET(100 MG) BY MOUTH DAILY 90 tablet 0  . Magnesium Malate 1250 (141.7 Mg) MG TABS Take 1 tablet by mouth 3 (three) times a week.    . metoprolol tartrate (LOPRESSOR) 25 MG tablet TAKE 1 TABLET BY MOUTH TWICE DAILY 180 tablet 1  . Multiple Vitamins-Minerals (MULTIVITAMIN ADULTS 50+ PO) Take 1 tablet by mouth daily.    . Omega-3 Fatty Acids (FISH  OIL) 1200 MG CAPS Take 2 capsules (2,400 mg total) by mouth 2 (two) times daily.    Marland Kitchen Zoster Vac Recomb Adjuvanted [facility]) injection Inject IM now and again in 2-6 months 0.5 mL 1  . Na Sulfate-K Sulfate-Mg Sulf 17.5-3.13-1.6 GM/177ML SOLN Suprep (no substitutions)-TAKE AS DIRECTED. (Patient not taking: Reported on 03/17/2017) 354 mL 0   No current facility-administered medications on file prior to visit.     BP 122/66 (BP Location: Right Arm, Cuff  Size: Large)   Pulse (!) 58   Temp (!) 97.4 F (36.3 C) (Oral)   Resp 16   Ht 5\' 5"  (1.651 m)   Wt 184 lb (83.5 kg)   SpO2 98%   BMI 30.62 kg/m       Objective:   Physical Exam  Constitutional: He is oriented to person, place, and time. He appears well-developed and well-nourished. No distress.  HENT:  Head: Normocephalic and atraumatic.  Eyes: No scleral icterus.  Cardiovascular: Normal rate and regular rhythm.  No murmur heard. Pulmonary/Chest: Effort normal and breath sounds normal. No respiratory distress. He has no wheezes. He has no rales.  Musculoskeletal: He exhibits no edema.  Neurological: He is alert and oriented to person, place, and time.  Skin: Skin is warm and dry.  Psychiatric: He has a normal mood and affect. His behavior is normal. Thought content normal.          Assessment & Plan:  HTN- stable on current meds. Continue same.  Hyperlipidemia- will obtain obtain flp. Continue fish oil.

## 2017-03-17 NOTE — Patient Instructions (Addendum)
Please complete lab work prior to leaving.   

## 2017-03-27 ENCOUNTER — Encounter: Payer: Self-pay | Admitting: Gastroenterology

## 2017-03-27 ENCOUNTER — Ambulatory Visit (AMBULATORY_SURGERY_CENTER): Payer: Medicare Other | Admitting: Gastroenterology

## 2017-03-27 VITALS — BP 120/73 | HR 53 | Temp 97.5°F | Resp 12 | Ht 66.0 in | Wt 185.0 lb

## 2017-03-27 DIAGNOSIS — Z800 Family history of malignant neoplasm of digestive organs: Secondary | ICD-10-CM | POA: Diagnosis not present

## 2017-03-27 DIAGNOSIS — Z1211 Encounter for screening for malignant neoplasm of colon: Secondary | ICD-10-CM

## 2017-03-27 DIAGNOSIS — K649 Unspecified hemorrhoids: Secondary | ICD-10-CM

## 2017-03-27 DIAGNOSIS — K5730 Diverticulosis of large intestine without perforation or abscess without bleeding: Secondary | ICD-10-CM

## 2017-03-27 MED ORDER — SODIUM CHLORIDE 0.9 % IV SOLN: 500.0000 mL | INTRAVENOUS | Status: DC

## 2017-03-27 NOTE — Progress Notes (Signed)
To Pacu, VSS. Report to [email or web address] 

## 2017-03-27 NOTE — Patient Instructions (Signed)
Discharge instructions given. Handouts on diverticulosis and hemorrhoids. Resume previous medications. YOU HAD AN ENDOSCOPIC PROCEDURE TODAY AT THE [facility]:   Refer to the procedure report that was given to you for any specific questions about what was found during the examination.  If the procedure report does not answer your questions, please call your gastroenterologist to clarify.  If you requested that your care partner not be given the details of your procedure findings, then the procedure report has been included in a sealed envelope for you to review at your convenience later.  YOU SHOULD EXPECT: Some feelings of bloating in the abdomen. Passage of more gas than usual.  Walking can help get rid of the air that was put into your GI tract during the procedure and reduce the bloating. If you had a lower endoscopy (such as a colonoscopy or flexible sigmoidoscopy) you may notice spotting of blood in your stool or on the toilet paper. If you underwent a bowel prep for your procedure, you may not have a normal bowel movement for a few days.  Please Note:  You might notice some irritation and congestion in your nose or some drainage.  This is from the oxygen used during your procedure.  There is no need for concern and it should clear up in a day or so.  SYMPTOMS TO REPORT IMMEDIATELY:   Following lower endoscopy (colonoscopy or flexible sigmoidoscopy):  Excessive amounts of blood in the stool  Significant tenderness or worsening of abdominal pains  Swelling of the abdomen that is new, acute  Fever of 100F or higher   For urgent or emergent issues, a gastroenterologist can be reached at any hour by calling [phone].   DIET:  We do recommend a small meal at first, but then you may proceed to your regular diet.  Drink plenty of fluids but you should avoid alcoholic beverages for 24 hours.  ACTIVITY:  You should plan to take it easy for the rest of today and you should  NOT DRIVE or use heavy machinery until tomorrow (because of the sedation medicines used during the test).    FOLLOW UP: Our staff will call the number listed on your records the next business day following your procedure to check on you and address any questions or concerns that you may have regarding the information given to you following your procedure. If we do not reach you, we will leave a message.  However, if you are feeling well and you are not experiencing any problems, there is no need to return our call.  We will assume that you have returned to your regular daily activities without incident.  If any biopsies were taken you will be contacted by phone or by letter within the next 1-3 weeks.  Please call us at [phone] if you have not heard about the biopsies in 3 weeks.    SIGNATURES/CONFIDENTIALITY: You and/or your care partner have signed paperwork which will be entered into your electronic medical record.  These signatures attest to the fact that that the information above on your After Visit Summary has been reviewed and is understood.  Full responsibility of the confidentiality of this discharge information lies with you and/or your care-partner. 

## 2017-03-27 NOTE — Op Note (Addendum)
[facility] Patient Name: Gerald [name] Procedure Date: 03/27/2017 8:12 AM MRN: [id number] Endoscopist: Rachael Fee , MD Age: 71 Referring MD:  Date of Birth: 1946/01/22 Gender: Male Account #: [id number] Procedure:                Colonoscopy Indications:              Screening for colorectal malignant neoplasm; father                            with colon cancer in his 9s Medicines:                Monitored Anesthesia Care Procedure:                Pre-Anesthesia Assessment:                           - Prior to the procedure, a History and Physical                            was performed, and patient medications and                            allergies were reviewed. The patient's tolerance of                            previous anesthesia was also reviewed. The risks                            and benefits of the procedure and the sedation                            options and risks were discussed with the patient.                            All questions were answered, and informed consent                            was obtained. Prior Anticoagulants: The patient has                            taken no previous anticoagulant or antiplatelet                            agents. ASA Grade Assessment: II - A patient with                            mild systemic disease. After reviewing the risks                            and benefits, the patient was deemed in                            satisfactory condition to undergo the procedure.  After obtaining informed consent, the colonoscope                            was passed under direct vision. Throughout the                            procedure, the patient's blood pressure, pulse, and                            oxygen saturations were monitored continuously. The                            Colonoscope was introduced through the anus and                            advanced to the the cecum,  identified by                            appendiceal orifice and ileocecal valve. The                            colonoscopy was performed without difficulty. The                            patient tolerated the procedure well. The quality                            of the bowel preparation was good. The ileocecal                            valve, appendiceal orifice, and rectum were                            photographed. Scope In: 8:19:28 AM Scope Out: 8:32:31 AM Scope Withdrawal Time: 0 hours 7 minutes 59 seconds  Total Procedure Duration: 0 hours 13 minutes 3 seconds  Findings:                 Multiple small and large-mouthed diverticula were                            found in the left colon.                           Internal and external hemorrhoids.                           The exam was otherwise without abnormality on                            direct and retroflexion views. Complications:            No immediate complications. Estimated blood loss:                            None. Estimated Blood Loss:  Estimated blood loss: none. Impression:               - Diverticulosis in the left colon.                           - Small hemorrhoids.                           - The examination was otherwise normal on direct                            and retroflexion views.                           - No polyps or cancers. Recommendation:           - Patient has a contact number available for                            emergencies. The signs and symptoms of potential                            delayed complications were discussed with the                            patient. Return to normal activities tomorrow.                            Written discharge instructions were provided to the                            patient.                           - Resume previous diet.                           - Continue present medications.                           - Repeat colonoscopy in 10  years for screening                            purposes. Rachael Fee, MD 03/27/2017 8:35:02 AM This report has been signed electronically.

## 2017-03-30 ENCOUNTER — Telehealth: Payer: Self-pay

## 2017-03-30 NOTE — Telephone Encounter (Signed)
Unable to leave message- line busy.

## 2017-04-01 ENCOUNTER — Telehealth: Payer: Self-pay | Admitting: *Deleted

## 2017-04-01 NOTE — Telephone Encounter (Signed)
  Follow up Call-  Call back number 03/27/2017  Post procedure Call Back phone  # [phone]  Permission to leave phone message Yes  Some recent data might be hidden     Patient questions:  Do you have a fever, pain , or abdominal swelling? No. Pain Score  0 *  Have you tolerated food without any problems? Yes.    Have you been able to return to your normal activities? Yes.    Do you have any questions about your discharge instructions: Diet   No. Medications  No. Follow up visit  No.  Do you have questions or concerns about your Care? No.  Actions: * If pain score is 4 or above: No action needed, pain <4.

## 2017-04-09 NOTE — Progress Notes (Signed)
Subjective:   Gerald [name] is a 72 y.o. male who presents for Medicare Annual/Subsequent preventive examination. The Patient was informed that the wellness visit is to identify future health risk and educate and initiate measures that can reduce risk for increased disease through the lifespan.   Describes health as fair, good or great? good  Review of Systems:  No ROS.  Medicare Wellness Visit. Additional risk factors are reflected in the social history. Cardiac Risk Factors include: advanced age (>53men, >39 women);dyslipidemia;hypertension;male gender Sleep patterns: Wakes every 3 hrs to urinate. Sleeps about 9 hrs. Home Safety/Smoke Alarms: Feels safe in home. Smoke alarms in place. Lives with wife and son with CP. Has aide  8 hrs per day to help with son.  Male:   CCS- Last 03/27/17. Recall 10 yrs PSA-  Lab Results  Component Value Date   PSA 6.28 (H) 09/01/2011   PSA 5.14 (H) 05/14/2011   PSA 4.32 (H) 10/23/2010       Objective:    Vitals: BP (!) 116/58 (BP Location: Left Arm, Patient Position: Sitting, Cuff Size: Normal)   Pulse (!) 56   Ht 5\' 6"  (1.676 m)   Wt 183 lb 3.2 oz (83.1 kg)   SpO2 95%   BMI 29.57 kg/m   Body mass index is 29.57 kg/m.  Advanced Directives 04/13/2017 07/29/2016 06/06/2016 06/06/2016 04/04/2016 03/19/2015  Does Patient Have a Medical Advance Directive? Yes Yes Yes Yes Yes Yes  Type of Paramedic of [place];Living will [place];Living will [place];Living will - [place];Living will [place]  Does patient want to make changes to medical advance directive? No - Patient declined No - Patient declined Yes (Inpatient - patient defers changing a medical advance directive at this time) - - -  Copy of [place] in Chart? No - copy requested No - copy requested - - No - copy requested No - copy requested    Tobacco Social History     Tobacco Use  Smoking Status Former Smoker  . Packs/day: 2.00  . Years: 30.00  . Pack years: 60.00  . Types: Cigarettes  . Last attempt to quit: 08/29/2006  . Years since quitting: 10.6  Smokeless Tobacco Never Used     Counseling given: Not Answered   Clinical Intake: Pain : No/denies pain     Past Medical History:  Diagnosis Date  . Back pain, chronic   . Benign localized prostatic hyperplasia with lower urinary tract symptoms (LUTS)   . Depression   . Diverticulosis of colon   . Elevated PSA   . Fatty liver   . GERD (gastroesophageal reflux disease)   . Hiatal hernia   . History of alcohol abuse    quit 1989  . History of esophageal stricture    2003  S/P  DILATATION  . History of esophagitis   . History of melanoma in situ    06/ 2017  REMOVAL RIGHT CHEEK AREA  . History of sepsis 06/06/2016   admitted for urosepsis , UTI due to pseudomonas-- resolved   . Hx of adenomatous colonic polyps    2003   TUBULAR ADENOMA  . Hyperlipidemia   . Hypertension   . Mild sleep apnea    2004 mild, per sleep study-- no cpap recommended  (pt denies)  . Urinary retention    Past Surgical History:  Procedure Laterality Date  . CARDIOVASCULAR STRESS TEST  04-17-2015  dr Claiborne Billings   Normal stress nulcear study w/ a small, mild, fixed inferior defect consistent with inferior thinning; no ischemia; normal LV function and wall motion , stress ef 56%  . COLONOSCOPY  last one 12-02-2006  . EXCISION LEFT ARM MASS  1988  . MOHS SURGERY  09/06/2015   right parotidemomasseteric excision w/ complex repair for melanoma in situ  . THULIUM LASER TURP (TRANSURETHRAL RESECTION OF PROSTATE) N/A 07/29/2016   Procedure: Marcelino Duster LASER TURP (TRANSURETHRAL RESECTION OF PROSTATE)/ TRANSRECTAL ULTRASOUND GUIDED PROSTATE BIOPSY;  Surgeon: Nickie Retort, MD;  Location: [facility];  Service: Urology;  Laterality: N/A;  . TONSILLECTOMY AND ADENOIDECTOMY  child  . TRANSTHORACIC  ECHOCARDIOGRAM  04/16/2015   moderate LVH,  ef 95-62%, grade 1 diastolic dysfunction/  trivial AR, MR and PR/  mild dilated ascending aorta/  mild TR  . UPPER GASTROINTESTINAL ENDOSCOPY  last one 02-15-2014   Family History  Problem Relation Age of Onset  . Coronary artery disease Father   . Colon cancer Father        ? unknown age dx/died at 75  . Heart attack Paternal Grandfather   . Coronary artery disease Maternal Grandmother   . Colon cancer Maternal Grandmother 74  . Cancer Maternal Grandmother        colon  . Melanoma Mother   . Coronary artery disease Paternal Grandmother   . Heart attack Maternal Grandfather   . Hypertension Maternal Aunt   . Prostate cancer Neg Hx   . Stroke Neg Hx   . Esophageal cancer Neg Hx   . Pancreatic cancer Neg Hx   . Rectal cancer Neg Hx   . Stomach cancer Neg Hx    Social History   Socioeconomic History  . Marital status: Married    Spouse name: None  . Number of children: 2  . Years of education: None  . Highest education level: None  Social Needs  . Financial resource strain: None  . Food insecurity - worry: None  . Food insecurity - inability: None  . Transportation needs - medical: None  . Transportation needs - non-medical: None  Occupational History  . Occupation: Retired  Tobacco Use  . Smoking status: Former Smoker    Packs/day: 2.00    Years: 30.00    Pack years: 60.00    Types: Cigarettes    Last attempt to quit: 08/29/2006    Years since quitting: 10.6  . Smokeless tobacco: Never Used  Substance and Sexual Activity  . Alcohol use: No  . Drug use: No  . Sexual activity: Yes  Other Topics Concern  . None  Social History Narrative   Diet: regular, lots of vegetables-----exercise : none but active   Caffeine use:  5 cups coffee daily          Outpatient Encounter Medications as of 04/13/2017  Medication Sig  . aspirin EC 81 MG tablet Take 81 mg by mouth every other day.  . ketoconazole (NIZORAL) 2 % shampoo  Apply 1 application topically as needed for irritation. For eczema  . losartan (COZAAR) 100 MG tablet TAKE 1 TABLET(100 MG) BY MOUTH DAILY  . Magnesium Malate 1250 (141.7 Mg) MG TABS Take 1 tablet by mouth 3 (three) times a week.  . metoprolol tartrate (LOPRESSOR) 25 MG tablet TAKE 1 TABLET BY MOUTH TWICE DAILY  . Multiple Vitamins-Minerals (MULTIVITAMIN ADULTS 50+ PO) Take 1 tablet by mouth daily.  . Omega-3 Fatty Acids (FISH OIL) 1200 MG CAPS Take  2 capsules (2,400 mg total) by mouth 2 (two) times daily.  Marland Kitchen Zoster Vac Recomb Adjuvanted [facility]) injection Inject 47mcg IM now and again in 2-6 months   Facility-Administered Encounter Medications as of 04/13/2017  Medication  . 0.9 %  sodium chloride infusion    Activities of Daily Living In your present state of health, do you have any difficulty performing the following activities: 04/13/2017 07/29/2016  Hearing? N N  Vision? N N  Comment Glasses for reading. Eye doctory yearly. -  Difficulty concentrating or making decisions? N N  Walking or climbing stairs? N N  Dressing or bathing? N N  Doing errands, shopping? N -  Preparing Food and eating ? N -  Using the Toilet? N -  In the past six months, have you accidently leaked urine? N -  Do you have problems with loss of bowel control? N -  Managing your Medications? N -  Managing your Finances? N -  Housekeeping or managing your Housekeeping? N -  Some recent data might be hidden    Patient Care Team: Debbrah Alar, NP as PCP - General (Internal Medicine)   Assessment:   This is a routine wellness examination for [place]. Physical assessment deferred to PCP.  Exercise Activities and Dietary recommendations Current Exercise Habits: The patient does not participate in regular exercise at present, Exercise limited by: None identified Diet (meal preparation, eat out, water intake, caffeinated beverages, dairy products, fruits and vegetables): Pt has been on the Next 56 day diet  since Sept 2018. Eating very healthy  Goals    . Increase physical activity     Walk 71min 3 days/ week    . Weight (lb) < 175 lb (79.4 kg)       Fall Risk Fall Risk  04/13/2017 04/04/2016 09/26/2015 03/19/2015 03/15/2014  Falls in the past year? No No No No No    Depression Screen PHQ 2/9 Scores 04/13/2017 04/04/2016 09/26/2015 03/19/2015  PHQ - 2 Score 0 0 0 0    Cognitive Function MMSE - Mini Mental State Exam 04/13/2017 04/04/2016  Orientation to time 5 5  Orientation to Place 5 5  Registration 3 3  Attention/ Calculation 5 5  Recall 3 2  Language- name 2 objects 2 2  Language- repeat 1 1  Language- follow 3 step command 3 3  Language- read & follow direction 1 1  Write a sentence 1 1  Copy design 1 1  Total score 30 29        Immunization History  Administered Date(s) Administered  . Influenza Split 12/30/2010, 01/06/2012, 02/03/2017  . Influenza Whole 02/18/2007, 03/13/2009, 01/31/2010  . Influenza, High Dose Seasonal PF 01/29/2017  . Influenza-Unspecified 01/29/2013, 12/29/2013, 01/15/2016  . Pneumococcal Conjugate-13 03/15/2014  . Pneumococcal Polysaccharide-23 05/29/2005, 01/06/2012  . Td 04/20/2001, 03/15/2014  . Zoster 08/27/2011   Screening Tests Health Maintenance  Topic Date Due  . TETANUS/TDAP  03/15/2024  . COLONOSCOPY  03/28/2027  . INFLUENZA VACCINE  Completed  . Hepatitis C Screening  Completed  . PNA vac Low Risk Adult  Completed       Plan:   Follow up with Melissa NP as scheduled 09/15/17.  Continue to eat heart healthy diet (full of fruits, vegetables, whole grains, lean protein, water--limit salt, fat, and sugar intake) and increase physical activity as tolerated.  Continue doing brain stimulating activities (puzzles, reading, adult coloring books, staying active) to keep memory sharp.   Bring a copy of your living will and/or  healthcare power of attorney to your next office visit.   I have personally reviewed and noted the following in  the patient's chart:   . Medical and social history . Use of alcohol, tobacco or illicit drugs  . Current medications and supplements . Functional ability and status . Nutritional status . Physical activity . Advanced directives . List of other physicians . Hospitalizations, surgeries, and ER visits in previous 12 months . Vitals . Screenings to include cognitive, depression, and falls . Referrals and appointments  In addition, I have reviewed and discussed with patient certain preventive protocols, quality metrics, and best practice recommendations. A written personalized care plan for preventive services as well as general preventive health recommendations were provided to patient.     Shela Nevin, [place]  04/13/2017

## 2017-04-13 ENCOUNTER — Ambulatory Visit (INDEPENDENT_AMBULATORY_CARE_PROVIDER_SITE_OTHER): Payer: Medicare Other | Admitting: *Deleted

## 2017-04-13 ENCOUNTER — Encounter: Payer: Self-pay | Admitting: *Deleted

## 2017-04-13 VITALS — BP 116/58 | HR 56 | Ht 66.0 in | Wt 183.2 lb

## 2017-04-13 DIAGNOSIS — Z0000 Encounter for general adult medical examination without abnormal findings: Secondary | ICD-10-CM | POA: Diagnosis not present

## 2017-04-13 NOTE — Progress Notes (Signed)
Reviewed and agree.  Tyrion Glaude S O'Sullivan NP 

## 2017-04-13 NOTE — Patient Instructions (Signed)
Follow up with Melissa NP as scheduled 09/15/17.  Continue to eat heart healthy diet (full of fruits, vegetables, whole grains, lean protein, water--limit salt, fat, and sugar intake) and increase physical activity as tolerated.  Continue doing brain stimulating activities (puzzles, reading, adult coloring books, staying active) to keep memory sharp.   Bring a copy of your living will and/or healthcare power of attorney to your next office visit.   Gerald [name] , Thank you for taking time to come for your Medicare Wellness Visit. I appreciate your ongoing commitment to your health goals. Please review the following plan we discussed and let me know if I can assist you in the future.   These are the goals we discussed: Goals    . Increase physical activity     Walk 32min 3 days/ week    . Weight (lb) < 175 lb (79.4 kg)       This is a list of the screening recommended for you and due dates:  Health Maintenance  Topic Date Due  . Tetanus Vaccine  03/15/2024  . Colon Cancer Screening  03/28/2027  . Flu Shot  Completed  .  Hepatitis C: One time screening is recommended by Center for Disease Control  (CDC) for  adults born from [birth year] through 1964.   Completed  . Pneumonia vaccines  Completed    Health Maintenance, Male A healthy lifestyle and preventive care is important for your health and wellness. Ask your health care provider about what schedule of regular examinations is right for you. What should I know about weight and diet? Eat a Healthy Diet  Eat plenty of vegetables, fruits, whole grains, low-fat dairy products, and lean protein.  Do not eat a lot of foods high in solid fats, added sugars, or salt.  Maintain a Healthy Weight Regular exercise can help you achieve or maintain a healthy weight. You should:  Do at least 150 minutes of exercise each week. The exercise should increase your heart rate and make you sweat (moderate-intensity exercise).  Do strength-training  exercises at least twice a week.  Watch Your Levels of Cholesterol and Blood Lipids  Have your blood tested for lipids and cholesterol every 5 years starting at 71 years of age. If you are at high risk for heart disease, you should start having your blood tested when you are 71 years old. You may need to have your cholesterol levels checked more often if: ? Your lipid or cholesterol levels are high. ? You are older than 71 years of age. ? You are at high risk for heart disease.  What should I know about cancer screening? Many types of cancers can be detected early and may often be prevented. Lung Cancer  You should be screened every year for lung cancer if: ? You are a current smoker who has smoked for at least 30 years. ? You are a former smoker who has quit within the past 15 years.  Talk to your health care provider about your screening options, when you should start screening, and how often you should be screened.  Colorectal Cancer  Routine colorectal cancer screening usually begins at 71 years of age and should be repeated every 5-10 years until you are 71 years old. You may need to be screened more often if early forms of precancerous polyps or small growths are found. Your health care provider may recommend screening at an earlier age if you have risk factors for colon cancer.  Your health care  provider may recommend using home test kits to check for hidden blood in the stool.  A small camera at the end of a tube can be used to examine your colon (sigmoidoscopy or colonoscopy). This checks for the earliest forms of colorectal cancer.  Prostate and Testicular Cancer  Depending on your age and overall health, your health care provider may do certain tests to screen for prostate and testicular cancer.  Talk to your health care provider about any symptoms or concerns you have about testicular or prostate cancer.  Skin Cancer  Check your skin from head to toe regularly.  Tell  your health care provider about any new moles or changes in moles, especially if: ? There is a change in a mole's size, shape, or color. ? You have a mole that is larger than a pencil eraser.  Always use sunscreen. Apply sunscreen liberally and repeat throughout the day.  Protect yourself by wearing long sleeves, pants, a wide-brimmed hat, and sunglasses when outside.  What should I know about heart disease, diabetes, and high blood pressure?  If you are 84-71 years of age, have your blood pressure checked every 3-5 years. If you are 50 years of age or older, have your blood pressure checked every year. You should have your blood pressure measured twice-once when you are at a hospital or clinic, and once when you are not at a hospital or clinic. Record the average of the two measurements. To check your blood pressure when you are not at a hospital or clinic, you can use: ? An automated blood pressure machine at a pharmacy. ? A home blood pressure monitor.  Talk to your health care provider about your target blood pressure.  If you are between 62-60 years old, ask your health care provider if you should take aspirin to prevent heart disease.  Have regular diabetes screenings by checking your fasting blood sugar level. ? If you are at a normal weight and have a low risk for diabetes, have this test once every three years after the age of 3. ? If you are overweight and have a high risk for diabetes, consider being tested at a younger age or more often.  A one-time screening for abdominal aortic aneurysm (AAA) by ultrasound is recommended for men aged 64-75 years who are current or former smokers. What should I know about preventing infection? Hepatitis B If you have a higher risk for hepatitis B, you should be screened for this virus. Talk with your health care provider to find out if you are at risk for hepatitis B infection. Hepatitis C Blood testing is recommended for:  Everyone born  from [birth year] through 1964.  Anyone with known risk factors for hepatitis C.  Sexually Transmitted Diseases (STDs)  You should be screened each year for STDs including gonorrhea and chlamydia if: ? You are sexually active and are younger than 71 years of age. ? You are older than 71 years of age and your health care provider tells you that you are at risk for this type of infection. ? Your sexual activity has changed since you were last screened and you are at an increased risk for chlamydia or gonorrhea. Ask your health care provider if you are at risk.  Talk with your health care provider about whether you are at high risk of being infected with HIV. Your health care provider may recommend a prescription medicine to help prevent HIV infection.  What else can I do?  Schedule regular  health, dental, and eye exams.  Stay current with your vaccines (immunizations).  Do not use any tobacco products, such as cigarettes, chewing tobacco, and e-cigarettes. If you need help quitting, ask your health care provider.  Limit alcohol intake to no more than 2 drinks per day. One drink equals 12 ounces of beer, 5 ounces of wine, or 1 ounces of hard liquor.  Do not use street drugs.  Do not share needles.  Ask your health care provider for help if you need support or information about quitting drugs.  Tell your health care provider if you often feel depressed.  Tell your health care provider if you have ever been abused or do not feel safe at home. This information is not intended to replace advice given to you by your health care provider. Make sure you discuss any questions you have with your health care provider. Document Released: 09/13/2007 Document Revised: 11/14/2015 Document Reviewed: 12/19/2014 Elsevier Interactive Patient Education  [employer].

## 2017-04-23 ENCOUNTER — Encounter: Payer: Self-pay | Admitting: Family

## 2017-04-27 ENCOUNTER — Encounter: Payer: Self-pay | Admitting: Family

## 2017-05-01 ENCOUNTER — Encounter: Payer: Self-pay | Admitting: Family

## 2017-05-26 ENCOUNTER — Encounter: Payer: Self-pay | Admitting: Family

## 2017-06-04 ENCOUNTER — Encounter: Payer: Self-pay | Admitting: Family

## 2017-06-08 ENCOUNTER — Other Ambulatory Visit: Payer: Self-pay | Admitting: Family

## 2017-06-22 ENCOUNTER — Encounter: Payer: Self-pay | Admitting: Family

## 2017-08-05 DIAGNOSIS — R972 Elevated prostate specific antigen [PSA]: Secondary | ICD-10-CM | POA: Diagnosis not present

## 2017-08-10 ENCOUNTER — Encounter: Payer: Self-pay | Admitting: Family

## 2017-08-11 DIAGNOSIS — R972 Elevated prostate specific antigen [PSA]: Secondary | ICD-10-CM | POA: Diagnosis not present

## 2017-08-11 DIAGNOSIS — N400 Enlarged prostate without lower urinary tract symptoms: Secondary | ICD-10-CM | POA: Diagnosis not present

## 2017-08-18 DIAGNOSIS — L821 Other seborrheic keratosis: Secondary | ICD-10-CM | POA: Diagnosis not present

## 2017-08-18 DIAGNOSIS — L814 Other melanin hyperpigmentation: Secondary | ICD-10-CM | POA: Diagnosis not present

## 2017-08-18 DIAGNOSIS — Z85820 Personal history of malignant melanoma of skin: Secondary | ICD-10-CM | POA: Diagnosis not present

## 2017-08-18 DIAGNOSIS — L570 Actinic keratosis: Secondary | ICD-10-CM | POA: Diagnosis not present

## 2017-08-18 DIAGNOSIS — D1801 Hemangioma of skin and subcutaneous tissue: Secondary | ICD-10-CM | POA: Diagnosis not present

## 2017-08-24 NOTE — Telephone Encounter (Signed)
Please contact pt re: unread mychart message. 

## 2017-08-25 NOTE — Telephone Encounter (Signed)
Gerald [name]-  Pt had scheduled appointment for 09/15/17. I spoke with him to try and move it up earlier but he states his pain has gotten a lot better and he wanted to give it another week. States he may cancel 09/15/17 appt if symptoms are improved, otherwise he will wait until 09/15/17.

## 2017-09-01 ENCOUNTER — Encounter: Payer: Self-pay | Admitting: Family

## 2017-09-01 ENCOUNTER — Ambulatory Visit (INDEPENDENT_AMBULATORY_CARE_PROVIDER_SITE_OTHER): Payer: Medicare Other | Admitting: Family

## 2017-09-01 VITALS — BP 130/72 | HR 56 | Temp 98.0°F | Resp 16 | Ht 65.0 in | Wt 176.2 lb

## 2017-09-01 DIAGNOSIS — R319 Hematuria, unspecified: Secondary | ICD-10-CM | POA: Diagnosis not present

## 2017-09-01 DIAGNOSIS — M25512 Pain in left shoulder: Secondary | ICD-10-CM

## 2017-09-01 DIAGNOSIS — I10 Essential (primary) hypertension: Secondary | ICD-10-CM

## 2017-09-01 DIAGNOSIS — G8929 Other chronic pain: Secondary | ICD-10-CM

## 2017-09-01 LAB — URINALYSIS, ROUTINE W REFLEX MICROSCOPIC
BILIRUBIN URINE: NEGATIVE
Ketones, ur: NEGATIVE
LEUKOCYTES UA: NEGATIVE
Nitrite: NEGATIVE
PH: 5.5 (ref 5.0–8.0)
RBC / HPF: NONE SEEN (ref 0–?)
SPECIFIC GRAVITY, URINE: 1.025 (ref 1.000–1.030)
TOTAL PROTEIN, URINE-UPE24: NEGATIVE
Urine Glucose: NEGATIVE
Urobilinogen, UA: 0.2 (ref 0.0–1.0)
WBC, UA: NONE SEEN (ref 0–?)

## 2017-09-01 LAB — BASIC METABOLIC PANEL
BUN: 25 mg/dL — AB (ref 6–23)
CHLORIDE: 105 meq/L (ref 96–112)
CO2: 30 meq/L (ref 19–32)
CREATININE: 0.83 mg/dL (ref 0.40–1.50)
Calcium: 9.5 mg/dL (ref 8.4–10.5)
GFR: 97.08 mL/min (ref >60.00)
GLUCOSE: 84 mg/dL (ref 70–99)
POTASSIUM: 4.3 meq/L (ref 3.5–5.1)
Sodium: 142 mEq/L (ref 135–145)

## 2017-09-01 NOTE — Progress Notes (Signed)
Subjective:    Patient ID: Gerald [name], male    DOB: 09/26/1945, 71 y.o.   MRN: [id number]  HPI  Gerald [name] is a 72 yr old male who presents today with two concerns:  1) Left shoulder pain-started 8 weeks ago.  Reports pain is intermittent.  Had some improvement with aleve. Reports that he has been working with a Land.  Was getting better.    2) Hematuria- reports that he had blood in his urine beginning on 08/25/17. Reports that the blood resolved on 08/29/17.  Patient has a 60 pack year hx of smoking and quit in 2008. He is established with [facility], Dr. Marlou Porch due to hx of elevated PSA/BPH. He has an appointment with urology on Thursday.    3) HTN- maintained on metoprolol and losartan.Denies CP/SOB or swelling.   BP Readings from Last 3 Encounters:  09/01/17 130/72  04/13/17 (!) 116/58  03/27/17 120/73    Review of Systems See HPI  Past Medical History:  Diagnosis Date  . Back pain, chronic   . Benign localized prostatic hyperplasia with lower urinary tract symptoms (LUTS)   . Depression   . Diverticulosis of colon   . Elevated PSA   . Fatty liver   . GERD (gastroesophageal reflux disease)   . Hiatal hernia   . History of alcohol abuse    quit 1989  . History of esophageal stricture    2003  S/P  DILATATION  . History of esophagitis   . History of melanoma in situ    06/ 2017  REMOVAL RIGHT CHEEK AREA  . History of sepsis 06/06/2016   admitted for urosepsis , UTI due to pseudomonas-- resolved   . Hx of adenomatous colonic polyps    2003   TUBULAR ADENOMA  . Hyperlipidemia   . Hypertension   . Mild sleep apnea    2004 mild, per sleep study-- no cpap recommended  (pt denies)  . Urinary retention      Social History   Socioeconomic History  . Marital status: Married    Spouse name: Not on file  . Number of children: 2  . Years of education: Not on file  . Highest education level: Not on file  Occupational History  . Occupation:  Retired  Engineer, production  . Financial resource strain: Not on file  . Food insecurity:    Worry: Not on file    Inability: Not on file  . Transportation needs:    Medical: Not on file    Non-medical: Not on file  Tobacco Use  . Smoking status: Former Smoker    Packs/day: 2.00    Years: 30.00    Pack years: 60.00    Types: Cigarettes    Last attempt to quit: 08/29/2006    Years since quitting: 11.0  . Smokeless tobacco: Never Used  Substance and Sexual Activity  . Alcohol use: No  . Drug use: No  . Sexual activity: Yes  Lifestyle  . Physical activity:    Days per week: Not on file    Minutes per session: Not on file  . Stress: Not on file  Relationships  . Social connections:    Talks on phone: Not on file    Gets together: Not on file    Attends religious service: Not on file    Active member of club or organization: Not on file    Attends meetings of clubs or organizations: Not on file  Relationship status: Not on file  . Intimate partner violence:    Fear of current or ex partner: Not on file    Emotionally abused: Not on file    Physically abused: Not on file    Forced sexual activity: Not on file  Other Topics Concern  . Not on file  Social History Narrative   Diet: regular, lots of vegetables-----exercise : none but active   Caffeine use:  5 cups coffee daily          Past Surgical History:  Procedure Laterality Date  . CARDIOVASCULAR STRESS TEST  04-17-2015  dr Tresa Endo   Normal stress nulcear study w/ a small, mild, fixed inferior defect consistent with inferior thinning; no ischemia; normal LV function and wall motion , stress ef 56%  . COLONOSCOPY  last one 12-02-2006  . EXCISION LEFT ARM MASS  1988  . MOHS SURGERY  09/06/2015   right parotidemomasseteric excision w/ complex repair for melanoma in situ  . THULIUM LASER TURP (TRANSURETHRAL RESECTION OF PROSTATE) N/A 07/29/2016   Procedure: Morton Peters LASER TURP (TRANSURETHRAL RESECTION OF PROSTATE)/ TRANSRECTAL  ULTRASOUND GUIDED PROSTATE BIOPSY;  Surgeon: Hildred Laser, MD;  Location: [facility];  Service: Urology;  Laterality: N/A;  . TONSILLECTOMY AND ADENOIDECTOMY  child  . TRANSTHORACIC ECHOCARDIOGRAM  04/16/2015   moderate LVH,  ef 60-65%, grade 1 diastolic dysfunction/  trivial AR, MR and PR/  mild dilated ascending aorta/  mild TR  . UPPER GASTROINTESTINAL ENDOSCOPY  last one 02-15-2014    Family History  Problem Relation Age of Onset  . Coronary artery disease Father   . Colon cancer Father        ? unknown age dx/died at 32  . Heart attack Paternal Grandfather   . Coronary artery disease Maternal Grandmother   . Colon cancer Maternal Grandmother 59  . Cancer Maternal Grandmother        colon  . Melanoma Mother   . Coronary artery disease Paternal Grandmother   . Heart attack Maternal Grandfather   . Hypertension Maternal Aunt   . Prostate cancer Neg Hx   . Stroke Neg Hx   . Esophageal cancer Neg Hx   . Pancreatic cancer Neg Hx   . Rectal cancer Neg Hx   . Stomach cancer Neg Hx     Allergies  Allergen Reactions  . Augmentin [Amoxicillin-Pot Clavulanate] Diarrhea    *Can take plain Amoxicillin* Has patient had a PCN reaction causing immediate rash, facial/tongue/throat swelling, SOB or lightheadedness with hypotension: No Has patient had a PCN reaction causing severe rash involving mucus membranes or skin necrosis: No Has patient had a PCN reaction that required hospitalization No Has patient had a PCN reaction occurring within the last 10 years: Yes If all of the above answers are "NO", then may proceed with Cephalosporin use.   Marland Kitchen Penicillins Rash    Childhood reaction *Can take Amoxicillin* Has patient had a PCN reaction causing immediate rash, facial/tongue/throat swelling, SOB or lightheadedness with hypotension: Unknown Has patient had a PCN reaction causing severe rash involving mucus membranes or skin necrosis: Unknown Has patient had a PCN  reaction that required hospitalization: Unknown Has patient had a PCN reaction occurring within the last 10 years: Unknown  If all of the above answers are "NO", then may proceed with Cephalosporin use    Current Outpatient Medications on File Prior to Visit  Medication Sig Dispense Refill  . aspirin EC 81 MG tablet Take 81 mg by  mouth every other day.    . ketoconazole (NIZORAL) 2 % shampoo Apply 1 application topically as needed for irritation. For eczema    . losartan (COZAAR) 100 MG tablet TAKE 1 TABLET(100 MG) BY MOUTH DAILY 90 tablet 0  . Magnesium Malate 1250 (141.7 Mg) MG TABS Take 1 tablet by mouth 3 (three) times a week.    . metoprolol tartrate (LOPRESSOR) 25 MG tablet TAKE 1 TABLET BY MOUTH TWICE DAILY 180 tablet 0  . Multiple Vitamins-Minerals (MULTIVITAMIN ADULTS 50+ PO) Take 1 tablet by mouth daily.    . Omega-3 Fatty Acids (FISH OIL) 1200 MG CAPS Take 2 capsules (2,400 mg total) by mouth 2 (two) times daily.     Current Facility-Administered Medications on File Prior to Visit  Medication Dose Route Frequency Provider Last Rate Last Dose  . 0.9 %  sodium chloride infusion  500 mL Intravenous Continuous Rachael Fee, MD        BP 130/72 (BP Location: Right Arm, Cuff Size: Large)   Pulse (!) 56   Temp 98 F (36.7 C) (Oral)   Resp 16   Ht 5\' 5"  (1.651 m)   Wt 176 lb 3.2 oz (79.9 kg)   SpO2 98%   BMI 29.32 kg/m       Objective:   Physical Exam  Constitutional: He is oriented to person, place, and time. He appears well-developed and well-nourished. No distress.  HENT:  Head: Normocephalic and atraumatic.  Cardiovascular: Normal rate and regular rhythm.  No murmur heard. Pulmonary/Chest: Effort normal and breath sounds normal. No respiratory distress. He has no wheezes. He has no rales.  Abdominal: Soft. He exhibits no distension. There is no CVA tenderness.  Musculoskeletal: He exhibits no edema.  + pain with flexion of left shoulder. No tenderness or swelling  noted of left shoulder.   Neurological: He is alert and oriented to person, place, and time.  Skin: Skin is warm and dry.  Psychiatric: He has a normal mood and affect. His behavior is normal. Thought content normal.          Assessment & Plan:  HTN- BP stable, obtain follow up bmet, continue current meds.  Hematuria- check UA/Culture. Advised pt to keep his upcoming appointment with Urology to discuss this with them on Thursday. Smoking hx is concerning due to increased risk of bladder CA.  Left shoulder pain- new. will refer to to sports medicine for further evaluation.

## 2017-09-01 NOTE — Patient Instructions (Signed)
Please complete lab work prior to leaving. Keep your appointment on Thursday with Urology to discuss your recent blood in your urine. You should be contacted about your referral to Sports medicine.

## 2017-09-02 ENCOUNTER — Encounter: Payer: Self-pay | Admitting: Family Medicine

## 2017-09-02 ENCOUNTER — Ambulatory Visit (INDEPENDENT_AMBULATORY_CARE_PROVIDER_SITE_OTHER): Payer: Medicare Other | Admitting: Family Medicine

## 2017-09-02 VITALS — BP 133/79 | HR 58 | Ht 67.0 in | Wt 175.0 lb

## 2017-09-02 DIAGNOSIS — M25512 Pain in left shoulder: Secondary | ICD-10-CM | POA: Diagnosis present

## 2017-09-02 LAB — URINE CULTURE
MICRO NUMBER:: 90670765
RESULT: NO GROWTH
SPECIMEN QUALITY:: ADEQUATE

## 2017-09-02 NOTE — Patient Instructions (Signed)
You have strained your rotator cuff (supraspinatus and infraspinatus) with rotator cuff impingement Try to avoid painful activities (overhead activities, lifting with extended arm) as much as possible. Aleve 1-2 tabs twice a day with food for pain and inflammation as needed. Can take tylenol in addition to this. Start physical therapy with transition to home exercise program. Do home exercise program with theraband and scapular stabilization exercises daily 3 sets of 10 once a day. If not improving at follow-up we will consider further imaging, injection, and/or nitro patches. Follow up with me in 1 month for reevaluation.

## 2017-09-03 ENCOUNTER — Encounter: Payer: Self-pay | Admitting: Family

## 2017-09-03 DIAGNOSIS — R3912 Poor urinary stream: Secondary | ICD-10-CM | POA: Diagnosis not present

## 2017-09-03 DIAGNOSIS — R972 Elevated prostate specific antigen [PSA]: Secondary | ICD-10-CM | POA: Diagnosis not present

## 2017-09-03 DIAGNOSIS — R310 Gross hematuria: Secondary | ICD-10-CM | POA: Diagnosis not present

## 2017-09-03 DIAGNOSIS — N401 Enlarged prostate with lower urinary tract symptoms: Secondary | ICD-10-CM | POA: Diagnosis not present

## 2017-09-03 DIAGNOSIS — R351 Nocturia: Secondary | ICD-10-CM | POA: Diagnosis not present

## 2017-09-04 ENCOUNTER — Encounter: Payer: Self-pay | Admitting: Family Medicine

## 2017-09-04 DIAGNOSIS — M25512 Pain in left shoulder: Secondary | ICD-10-CM | POA: Insufficient documentation

## 2017-09-04 NOTE — Assessment & Plan Note (Signed)
2/2 strain of supraspinatus and infraspinatus with impingement.  Start physical therapy and home exercises.  Aleve if needed.  Consider further imaging, injection, nitro patches if not improving.  F/u in 1 month.

## 2017-09-04 NOTE — Progress Notes (Signed)
PCP and consultation requested by: Debbrah Alar, NP  Subjective:   HPI: Patient is a 72 y.o. male here for left shoulder pain.  Patient reports he was lifting dirt about 8 weeks ago. No acute injury but after this developed worsening lateral left shoulder pain about 2-3 days later. Worse with overhead motions, lifting things. Pain is 1/10 but up to 10/10 and sharp at times. No skin changes, numbness.  Past Medical History:  Diagnosis Date  . Back pain, chronic   . Benign localized prostatic hyperplasia with lower urinary tract symptoms (LUTS)   . Depression   . Diverticulosis of colon   . Elevated PSA   . Fatty liver   . GERD (gastroesophageal reflux disease)   . Hiatal hernia   . History of alcohol abuse    quit 1989  . History of esophageal stricture    2003  S/P  DILATATION  . History of esophagitis   . History of melanoma in situ    06/ 2017  REMOVAL RIGHT CHEEK AREA  . History of sepsis 06/06/2016   admitted for urosepsis , UTI due to pseudomonas-- resolved   . Hx of adenomatous colonic polyps    2003   TUBULAR ADENOMA  . Hyperlipidemia   . Hypertension   . Mild sleep apnea    2004 mild, per sleep study-- no cpap recommended  (pt denies)  . Urinary retention     Current Outpatient Medications on File Prior to Visit  Medication Sig Dispense Refill  . aspirin EC 81 MG tablet Take 81 mg by mouth every other day.    . ketoconazole (NIZORAL) 2 % shampoo Apply 1 application topically as needed for irritation. For eczema    . losartan (COZAAR) 100 MG tablet TAKE 1 TABLET(100 MG) BY MOUTH DAILY 90 tablet 0  . Magnesium Malate 1250 (141.7 Mg) MG TABS Take 1 tablet by mouth 3 (three) times a week.    . metoprolol tartrate (LOPRESSOR) 25 MG tablet TAKE 1 TABLET BY MOUTH TWICE DAILY 180 tablet 0  . Multiple Vitamins-Minerals (MULTIVITAMIN ADULTS 50+ PO) Take 1 tablet by mouth daily.    . Omega-3 Fatty Acids (FISH OIL) 1200 MG CAPS Take 2 capsules (2,400 mg total) by  mouth 2 (two) times daily.     Current Facility-Administered Medications on File Prior to Visit  Medication Dose Route Frequency Provider Last Rate Last Dose  . 0.9 %  sodium chloride infusion  500 mL Intravenous Continuous Milus Banister, MD        Past Surgical History:  Procedure Laterality Date  . CARDIOVASCULAR STRESS TEST  04-17-2015  dr Claiborne Billings   Normal stress nulcear study w/ a small, mild, fixed inferior defect consistent with inferior thinning; no ischemia; normal LV function and wall motion , stress ef 56%  . COLONOSCOPY  last one 12-02-2006  . EXCISION LEFT ARM MASS  1988  . MOHS SURGERY  09/06/2015   right parotidemomasseteric excision w/ complex repair for melanoma in situ  . THULIUM LASER TURP (TRANSURETHRAL RESECTION OF PROSTATE) N/A 07/29/2016   Procedure: Marcelino Duster LASER TURP (TRANSURETHRAL RESECTION OF PROSTATE)/ TRANSRECTAL ULTRASOUND GUIDED PROSTATE BIOPSY;  Surgeon: Nickie Retort, MD;  Location: [facility];  Service: Urology;  Laterality: N/A;  . TONSILLECTOMY AND ADENOIDECTOMY  child  . TRANSTHORACIC ECHOCARDIOGRAM  04/16/2015   moderate LVH,  ef 82-95%, grade 1 diastolic dysfunction/  trivial AR, MR and PR/  mild dilated ascending aorta/  mild TR  . UPPER  GASTROINTESTINAL ENDOSCOPY  last one 02-15-2014    Allergies  Allergen Reactions  . Augmentin [Amoxicillin-Pot Clavulanate] Diarrhea    *Can take plain Amoxicillin* Has patient had a PCN reaction causing immediate rash, facial/tongue/throat swelling, SOB or lightheadedness with hypotension: No Has patient had a PCN reaction causing severe rash involving mucus membranes or skin necrosis: No Has patient had a PCN reaction that required hospitalization No Has patient had a PCN reaction occurring within the last 10 years: Yes If all of the above answers are "NO", then may proceed with Cephalosporin use.   Marland Kitchen Penicillins Rash    Childhood reaction *Can take Amoxicillin* Has patient had a PCN  reaction causing immediate rash, facial/tongue/throat swelling, SOB or lightheadedness with hypotension: Unknown Has patient had a PCN reaction causing severe rash involving mucus membranes or skin necrosis: Unknown Has patient had a PCN reaction that required hospitalization: Unknown Has patient had a PCN reaction occurring within the last 10 years: Unknown  If all of the above answers are "NO", then may proceed with Cephalosporin use    Social History   Socioeconomic History  . Marital status: Married    Spouse name: Not on file  . Number of children: 2  . Years of education: Not on file  . Highest education level: Not on file  Occupational History  . Occupation: Retired  Scientific laboratory technician  . Financial resource strain: Not on file  . Food insecurity:    Worry: Not on file    Inability: Not on file  . Transportation needs:    Medical: Not on file    Non-medical: Not on file  Tobacco Use  . Smoking status: Former Smoker    Packs/day: 2.00    Years: 30.00    Pack years: 60.00    Types: Cigarettes    Last attempt to quit: 08/29/2006    Years since quitting: 11.0  . Smokeless tobacco: Never Used  Substance and Sexual Activity  . Alcohol use: No  . Drug use: No  . Sexual activity: Yes  Lifestyle  . Physical activity:    Days per week: Not on file    Minutes per session: Not on file  . Stress: Not on file  Relationships  . Social connections:    Talks on phone: Not on file    Gets together: Not on file    Attends religious service: Not on file    Active member of club or organization: Not on file    Attends meetings of clubs or organizations: Not on file    Relationship status: Not on file  . Intimate partner violence:    Fear of current or ex partner: Not on file    Emotionally abused: Not on file    Physically abused: Not on file    Forced sexual activity: Not on file  Other Topics Concern  . Not on file  Social History Narrative   Diet: regular, lots of  vegetables-----exercise : none but active   Caffeine use:  5 cups coffee daily          Family History  Problem Relation Age of Onset  . Coronary artery disease Father   . Colon cancer Father        ? unknown age dx/died at 11  . Heart attack Paternal Grandfather   . Coronary artery disease Maternal Grandmother   . Colon cancer Maternal Grandmother 74  . Cancer Maternal Grandmother        colon  . Melanoma  Mother   . Coronary artery disease Paternal Grandmother   . Heart attack Maternal Grandfather   . Hypertension Maternal Aunt   . Prostate cancer Neg Hx   . Stroke Neg Hx   . Esophageal cancer Neg Hx   . Pancreatic cancer Neg Hx   . Rectal cancer Neg Hx   . Stomach cancer Neg Hx     BP 133/79   Pulse (!) 58   Ht 5\' 7"  (1.702 m)   Wt 175 lb (79.4 kg)   BMI 27.41 kg/m   Review of Systems: See HPI above.     Objective:  Physical Exam:  Gen: NAD, comfortable in exam room  Left shoulder: No swelling, ecchymoses.  No gross deformity. No TTP. FROM with painful arc. Positive Hawkins, negative Neers. Negative Yergasons. Strength 5/5 with empty can and resisted internal/external rotation.  Pain empty can and ER. Negative apprehension. NV intact distally.  Right shoulder: No swelling, ecchymoses.  No gross deformity. No TTP. FROM. Strength 5/5 with empty can and resisted internal/external rotation. NV intact distally.   Assessment & Plan:  1. Left shoulder pain - 2/2 strain of supraspinatus and infraspinatus with impingement.  Start physical therapy and home exercises.  Aleve if needed.  Consider further imaging, injection, nitro patches if not improving.  F/u in 1 month.

## 2017-09-14 ENCOUNTER — Ambulatory Visit: Payer: Medicare Other | Attending: Family Medicine | Admitting: Physical Therapy

## 2017-09-14 ENCOUNTER — Other Ambulatory Visit: Payer: Self-pay

## 2017-09-14 ENCOUNTER — Encounter: Payer: Self-pay | Admitting: Physical Therapy

## 2017-09-14 DIAGNOSIS — M25612 Stiffness of left shoulder, not elsewhere classified: Secondary | ICD-10-CM | POA: Diagnosis not present

## 2017-09-14 DIAGNOSIS — M25512 Pain in left shoulder: Secondary | ICD-10-CM | POA: Diagnosis not present

## 2017-09-14 DIAGNOSIS — M6281 Muscle weakness (generalized): Secondary | ICD-10-CM | POA: Diagnosis not present

## 2017-09-14 DIAGNOSIS — G8929 Other chronic pain: Secondary | ICD-10-CM | POA: Diagnosis not present

## 2017-09-14 NOTE — Therapy (Signed)
Time  6    Period  Weeks    Status  New    Target Date  10/26/17      PT LONG TERM GOAL #5   Title  Patient to report 0/10 pain in L shoulder when donning shirt.     Time  6    Period  Weeks    Status  New    Target Date  10/26/17             Plan - 09/14/17 1512    Clinical Impression Statement  Patient is a 72y/o M presenting to OPPT with L shoulder pain of 9 weeks duration. Reports pain first started after heavy lifting in the yard and is centered around L bicep tendon- worst with shoulder IR and resisted elbow flexion. No imaging done at this time. Patient with limitations in L shoulder IR and flexion AROM/PROM, pain with L elbow flexion, and weakness and pain in resisted shoulder abduction, IR, ER. Patient educated on and received HEP handout for gentle AAROM and scapular strengthening; advised not to push into pain. Would benefit from skilled PT services 2x/week for 6 weeks to improve L shoulder ROM, strength, activity tolerance.     History and Personal Factors relevant to plan of care:  previous L shoulder surgery with excision of a "growth"    Clinical Presentation  Stable    Clinical Decision Making  Low    Rehab Potential  Good    PT Frequency  2x / week    PT Duration  6 weeks    PT Treatment/Interventions  ADLs/Self Care Home Management;Cryotherapy;Electrical Stimulation;Iontophoresis 4mg /ml Dexamethasone;Moist Heat;Ultrasound;Functional mobility training;Therapeutic activities;Therapeutic exercise;Manual  techniques;Patient/family education;Scar mobilization;Passive range of motion;Dry needling;Energy conservation;Splinting;Taping;Vasopneumatic Device    PT Next Visit Plan  reassess HEP       Patient will benefit from skilled therapeutic intervention in order to improve the following deficits and impairments:  Hypomobility, Decreased scar mobility, Pain, Impaired UE functional use, Decreased strength, Decreased activity tolerance, Improper body mechanics, Decreased range of motion, Impaired flexibility, Postural dysfunction  Visit Diagnosis: Chronic left shoulder pain  Stiffness of left shoulder, not elsewhere classified  Muscle weakness (generalized)     Problem List Patient Active Problem List   Diagnosis Date Noted  . Left shoulder pain 09/04/2017  . BPH (benign prostatic hyperplasia) 09/17/2016  . Melanoma of skin ([place]) 09/26/2015  . Mild obesity 06/05/2015  . Hypertensive left ventricular hypertrophy, without heart failure 06/05/2015  . 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  . HTN (hypertension) 09/01/2011  . Elevated PSA 05/07/2011  . GERD (gastroesophageal reflux disease) 01/14/2011  . Family history of colon cancer 01/14/2011  . History of colon polyps 01/14/2011  . Fatty liver disease, nonalcoholic 51/76/1607  . ESOPHAGEAL STRICTURE 11/27/2008  . COLONIC POLYPS, ADENOMATOUS, HX OF 11/27/2008  . ALCOHOL ABUSE, HX OF 09/16/2007  . DIVERTICULOSIS, COLON 07/16/2007  . BACK PAIN, CHRONIC 07/16/2007  . Hyperlipidemia 08/10/2006     Janene Harvey, PT, DPT 09/14/17 4:49 PM   [facility] [address]  [place], [place], [address] Phone: [phone]   Fax:  [phone]  Name: Gerald [name] MRN: [id number] Date of Birth: 10/11/1945  Time  6    Period  Weeks    Status  New    Target Date  10/26/17      PT LONG TERM GOAL #5   Title  Patient to report 0/10 pain in L shoulder when donning shirt.     Time  6    Period  Weeks    Status  New    Target Date  10/26/17             Plan - 09/14/17 1512    Clinical Impression Statement  Patient is a 72y/o M presenting to OPPT with L shoulder pain of 9 weeks duration. Reports pain first started after heavy lifting in the yard and is centered around L bicep tendon- worst with shoulder IR and resisted elbow flexion. No imaging done at this time. Patient with limitations in L shoulder IR and flexion AROM/PROM, pain with L elbow flexion, and weakness and pain in resisted shoulder abduction, IR, ER. Patient educated on and received HEP handout for gentle AAROM and scapular strengthening; advised not to push into pain. Would benefit from skilled PT services 2x/week for 6 weeks to improve L shoulder ROM, strength, activity tolerance.     History and Personal Factors relevant to plan of care:  previous L shoulder surgery with excision of a "growth"    Clinical Presentation  Stable    Clinical Decision Making  Low    Rehab Potential  Good    PT Frequency  2x / week    PT Duration  6 weeks    PT Treatment/Interventions  ADLs/Self Care Home Management;Cryotherapy;Electrical Stimulation;Iontophoresis 4mg /ml Dexamethasone;Moist Heat;Ultrasound;Functional mobility training;Therapeutic activities;Therapeutic exercise;Manual  techniques;Patient/family education;Scar mobilization;Passive range of motion;Dry needling;Energy conservation;Splinting;Taping;Vasopneumatic Device    PT Next Visit Plan  reassess HEP       Patient will benefit from skilled therapeutic intervention in order to improve the following deficits and impairments:  Hypomobility, Decreased scar mobility, Pain, Impaired UE functional use, Decreased strength, Decreased activity tolerance, Improper body mechanics, Decreased range of motion, Impaired flexibility, Postural dysfunction  Visit Diagnosis: Chronic left shoulder pain  Stiffness of left shoulder, not elsewhere classified  Muscle weakness (generalized)     Problem List Patient Active Problem List   Diagnosis Date Noted  . Left shoulder pain 09/04/2017  . BPH (benign prostatic hyperplasia) 09/17/2016  . Melanoma of skin ([place]) 09/26/2015  . Mild obesity 06/05/2015  . Hypertensive left ventricular hypertrophy, without heart failure 06/05/2015  . 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  . HTN (hypertension) 09/01/2011  . Elevated PSA 05/07/2011  . GERD (gastroesophageal reflux disease) 01/14/2011  . Family history of colon cancer 01/14/2011  . History of colon polyps 01/14/2011  . Fatty liver disease, nonalcoholic 51/76/1607  . ESOPHAGEAL STRICTURE 11/27/2008  . COLONIC POLYPS, ADENOMATOUS, HX OF 11/27/2008  . ALCOHOL ABUSE, HX OF 09/16/2007  . DIVERTICULOSIS, COLON 07/16/2007  . BACK PAIN, CHRONIC 07/16/2007  . Hyperlipidemia 08/10/2006     Janene Harvey, PT, DPT 09/14/17 4:49 PM   [facility] [address]  [place], [place], [address] Phone: [phone]   Fax:  [phone]  Name: Gerald [name] MRN: [id number] Date of Birth: 10/11/1945  [facility] [address]  [place], [place], [address] Phone: [phone]   Fax:  [phone]  Physical Therapy Evaluation  Patient Details  Name: Gerald [name] MRN: [id number] Date of Birth: May 19, 1945 Referring Provider: Karlton Lemon, MD   Encounter Date: 09/14/2017  PT End of Session - 09/14/17 1511    Visit Number  1    Number of Visits  13    Date for PT Re-Evaluation  10/26/17    Authorization Type  [employer]    PT Start Time  1416    PT Stop Time  1506    PT Time Calculation (min)  50 min    Activity Tolerance  Patient tolerated treatment well    Behavior During Therapy  [facility] for tasks assessed/performed       Past Medical History:  Diagnosis Date  . Back pain, chronic   . Benign localized prostatic hyperplasia with lower urinary tract symptoms (LUTS)   . Depression   . Diverticulosis of colon   . Elevated PSA   . Fatty liver   . GERD (gastroesophageal reflux disease)   . Hiatal hernia   . History of alcohol abuse    quit 1989  . History of esophageal stricture    2003  S/P  DILATATION  . History of esophagitis   . History of melanoma in situ    06/ 2017  REMOVAL RIGHT CHEEK AREA  . History of sepsis 06/06/2016   admitted for urosepsis , UTI due to pseudomonas-- resolved   . Hx of adenomatous colonic polyps    2003   TUBULAR ADENOMA  . Hyperlipidemia   . Hypertension   . Mild sleep apnea    2004 mild, per sleep study-- no cpap recommended  (pt denies)  . Urinary retention     Past Surgical History:  Procedure Laterality Date  . CARDIOVASCULAR STRESS TEST  04-17-2015  dr Claiborne Billings   Normal stress nulcear study w/ a small, mild, fixed inferior defect consistent with inferior thinning; no ischemia; normal LV function and wall motion , stress ef 56%  . COLONOSCOPY  last one 12-02-2006  . EXCISION LEFT ARM MASS  1988  . MOHS SURGERY  09/06/2015   right parotidemomasseteric  excision w/ complex repair for melanoma in situ  . THULIUM LASER TURP (TRANSURETHRAL RESECTION OF PROSTATE) N/A 07/29/2016   Procedure: Marcelino Duster LASER TURP (TRANSURETHRAL RESECTION OF PROSTATE)/ TRANSRECTAL ULTRASOUND GUIDED PROSTATE BIOPSY;  Surgeon: Nickie Retort, MD;  Location: [facility];  Service: Urology;  Laterality: N/A;  . TONSILLECTOMY AND ADENOIDECTOMY  child  . TRANSTHORACIC ECHOCARDIOGRAM  04/16/2015   moderate LVH,  ef 25-05%, grade 1 diastolic dysfunction/  trivial AR, MR and PR/  mild dilated ascending aorta/  mild TR  . UPPER GASTROINTESTINAL ENDOSCOPY  last one 02-15-2014    There were no vitals filed for this visit.   Subjective Assessment - 09/14/17 1419    Subjective  Patient reports L shoulder pain started 9 weeks ago after lifting up dirt that was too heavy when helping his wife in the garden; 3 days after that he had L shoulder pain with any kind of movement. Went to chiropractor- given ice pack and exercises. 4 weeks later saw MD for a shot- did not received cortisone shot and was instead referred to PT.  Pain at best: 0/10, pain at worst: 10/10. Pain is centered at anterior L shoulder and then radiates  Time  6    Period  Weeks    Status  New    Target Date  10/26/17      PT LONG TERM GOAL #5   Title  Patient to report 0/10 pain in L shoulder when donning shirt.     Time  6    Period  Weeks    Status  New    Target Date  10/26/17             Plan - 09/14/17 1512    Clinical Impression Statement  Patient is a 72y/o M presenting to OPPT with L shoulder pain of 9 weeks duration. Reports pain first started after heavy lifting in the yard and is centered around L bicep tendon- worst with shoulder IR and resisted elbow flexion. No imaging done at this time. Patient with limitations in L shoulder IR and flexion AROM/PROM, pain with L elbow flexion, and weakness and pain in resisted shoulder abduction, IR, ER. Patient educated on and received HEP handout for gentle AAROM and scapular strengthening; advised not to push into pain. Would benefit from skilled PT services 2x/week for 6 weeks to improve L shoulder ROM, strength, activity tolerance.     History and Personal Factors relevant to plan of care:  previous L shoulder surgery with excision of a "growth"    Clinical Presentation  Stable    Clinical Decision Making  Low    Rehab Potential  Good    PT Frequency  2x / week    PT Duration  6 weeks    PT Treatment/Interventions  ADLs/Self Care Home Management;Cryotherapy;Electrical Stimulation;Iontophoresis 4mg /ml Dexamethasone;Moist Heat;Ultrasound;Functional mobility training;Therapeutic activities;Therapeutic exercise;Manual  techniques;Patient/family education;Scar mobilization;Passive range of motion;Dry needling;Energy conservation;Splinting;Taping;Vasopneumatic Device    PT Next Visit Plan  reassess HEP       Patient will benefit from skilled therapeutic intervention in order to improve the following deficits and impairments:  Hypomobility, Decreased scar mobility, Pain, Impaired UE functional use, Decreased strength, Decreased activity tolerance, Improper body mechanics, Decreased range of motion, Impaired flexibility, Postural dysfunction  Visit Diagnosis: Chronic left shoulder pain  Stiffness of left shoulder, not elsewhere classified  Muscle weakness (generalized)     Problem List Patient Active Problem List   Diagnosis Date Noted  . Left shoulder pain 09/04/2017  . BPH (benign prostatic hyperplasia) 09/17/2016  . Melanoma of skin ([place]) 09/26/2015  . Mild obesity 06/05/2015  . Hypertensive left ventricular hypertrophy, without heart failure 06/05/2015  . 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  . HTN (hypertension) 09/01/2011  . Elevated PSA 05/07/2011  . GERD (gastroesophageal reflux disease) 01/14/2011  . Family history of colon cancer 01/14/2011  . History of colon polyps 01/14/2011  . Fatty liver disease, nonalcoholic 51/76/1607  . ESOPHAGEAL STRICTURE 11/27/2008  . COLONIC POLYPS, ADENOMATOUS, HX OF 11/27/2008  . ALCOHOL ABUSE, HX OF 09/16/2007  . DIVERTICULOSIS, COLON 07/16/2007  . BACK PAIN, CHRONIC 07/16/2007  . Hyperlipidemia 08/10/2006     Janene Harvey, PT, DPT 09/14/17 4:49 PM   [facility] [address]  [place], [place], [address] Phone: [phone]   Fax:  [phone]  Name: Gerald [name] MRN: [id number] Date of Birth: 10/11/1945

## 2017-09-15 ENCOUNTER — Ambulatory Visit: Payer: No Typology Code available for payment source | Admitting: Family

## 2017-09-16 ENCOUNTER — Other Ambulatory Visit: Payer: Self-pay | Admitting: Family

## 2017-09-17 DIAGNOSIS — R310 Gross hematuria: Secondary | ICD-10-CM | POA: Diagnosis not present

## 2017-09-19 ENCOUNTER — Other Ambulatory Visit: Payer: Self-pay | Admitting: Family

## 2017-09-21 ENCOUNTER — Ambulatory Visit: Payer: Medicare Other | Admitting: Physical Therapy

## 2017-09-22 ENCOUNTER — Encounter: Payer: Self-pay | Admitting: Physical Therapy

## 2017-09-22 ENCOUNTER — Ambulatory Visit: Payer: Medicare Other | Admitting: Physical Therapy

## 2017-09-22 DIAGNOSIS — G8929 Other chronic pain: Secondary | ICD-10-CM

## 2017-09-22 DIAGNOSIS — M25512 Pain in left shoulder: Secondary | ICD-10-CM | POA: Diagnosis not present

## 2017-09-22 DIAGNOSIS — M25612 Stiffness of left shoulder, not elsewhere classified: Secondary | ICD-10-CM

## 2017-09-22 DIAGNOSIS — M6281 Muscle weakness (generalized): Secondary | ICD-10-CM | POA: Diagnosis not present

## 2017-09-22 NOTE — Therapy (Signed)
[facility] [address]  [place], [place], [address] Phone: [phone]   Fax:  [phone]  Name: Gerald [name] MRN: [id number] Date of Birth: November 02, 1945  [facility] [address]  [place], [place], [address] Phone: [phone]   Fax:  [phone]  Physical Therapy Treatment  Patient Details  Name: [name] MRN: [id number] Date of Birth: 1945-12-11 Referring Provider: Karlton Lemon, MD   Encounter Date: 09/22/2017  PT End of Session - 09/22/17 1610    Visit Number  2    Number of Visits  13    Date for PT Re-Evaluation  10/26/17    Authorization Type  [employer]    PT Start Time  1519    PT Stop Time  1608    PT Time Calculation (min)  49 min    Activity Tolerance  Patient tolerated treatment well    Behavior During Therapy  [facility] for tasks assessed/performed       Past Medical History:  Diagnosis Date  . Back pain, chronic   . Benign localized prostatic hyperplasia with lower urinary tract symptoms (LUTS)   . Depression   . Diverticulosis of colon   . Elevated PSA   . Fatty liver   . GERD (gastroesophageal reflux disease)   . Hiatal hernia   . History of alcohol abuse    quit 1989  . History of esophageal stricture    2003  S/P  DILATATION  . History of esophagitis   . History of melanoma in situ    06/ 2017  REMOVAL RIGHT CHEEK AREA  . History of sepsis 06/06/2016   admitted for urosepsis , UTI due to pseudomonas-- resolved   . Hx of adenomatous colonic polyps    2003   TUBULAR ADENOMA  . Hyperlipidemia   . Hypertension   . Mild sleep apnea    2004 mild, per sleep study-- no cpap recommended  (pt denies)  . Urinary retention     Past Surgical History:  Procedure Laterality Date  . CARDIOVASCULAR STRESS TEST  04-17-2015  dr Claiborne Billings   Normal stress nulcear study w/ a small, mild, fixed inferior defect consistent with inferior thinning; no ischemia; normal LV function and wall motion , stress ef 56%  . COLONOSCOPY  last one 12-02-2006  . EXCISION LEFT ARM MASS  1988  . MOHS SURGERY  09/06/2015   right parotidemomasseteric  excision w/ complex repair for melanoma in situ  . THULIUM LASER TURP (TRANSURETHRAL RESECTION OF PROSTATE) N/A 07/29/2016   Procedure: Marcelino Duster LASER TURP (TRANSURETHRAL RESECTION OF PROSTATE)/ TRANSRECTAL ULTRASOUND GUIDED PROSTATE BIOPSY;  Surgeon: Nickie Retort, MD;  Location: [facility];  Service: Urology;  Laterality: N/A;  . TONSILLECTOMY AND ADENOIDECTOMY  child  . TRANSTHORACIC ECHOCARDIOGRAM  04/16/2015   moderate LVH,  ef 59-56%, grade 1 diastolic dysfunction/  trivial AR, MR and PR/  mild dilated ascending aorta/  mild TR  . UPPER GASTROINTESTINAL ENDOSCOPY  last one 02-15-2014    There were no vitals filed for this visit.  Subjective Assessment - 09/22/17 1519    Subjective  Reports arm isn't hurting much. Can only remember his shoulder stinging him 1-2x since initial eval.     Pertinent History  L shoulder surgery, chronic back pain, GERD, hiatal hernia, Hx of sepsis, HLD, HTN    Diagnostic tests  None    Patient Stated Goals  get rid of this pain    Currently in Pain?  No/denies  [facility] [address]  [place], [place], [address] Phone: [phone]   Fax:  [phone]  Name: Gerald [name] MRN: [id number] Date of Birth: November 02, 1945  [facility] [address]  [place], [place], [address] Phone: [phone]   Fax:  [phone]  Name: Gerald [name] MRN: [id number] Date of Birth: November 02, 1945

## 2017-09-24 ENCOUNTER — Ambulatory Visit: Payer: Medicare Other

## 2017-09-24 DIAGNOSIS — M6281 Muscle weakness (generalized): Secondary | ICD-10-CM

## 2017-09-24 DIAGNOSIS — G8929 Other chronic pain: Secondary | ICD-10-CM | POA: Diagnosis not present

## 2017-09-24 DIAGNOSIS — M25612 Stiffness of left shoulder, not elsewhere classified: Secondary | ICD-10-CM | POA: Diagnosis not present

## 2017-09-24 DIAGNOSIS — M25512 Pain in left shoulder: Secondary | ICD-10-CM | POA: Diagnosis not present

## 2017-09-24 NOTE — Therapy (Signed)
[facility] [address]  [place], [place], [address] Phone: [phone]   Fax:  [phone]  Physical Therapy Treatment  Patient Details  Name: Gerald [name] MRN: [id number] Date of Birth: [birth year] Referring Provider: Karlton Lemon, MD   Encounter Date: 09/24/2017  PT End of Session - 09/24/17 1452    Visit Number  3    Number of Visits  13    Date for PT Re-Evaluation  10/26/17    Authorization Type  [employer]    PT Start Time  [phone]    PT Stop Time  1527    PT Time Calculation (min)  39 min    Activity Tolerance  Patient tolerated treatment well    Behavior During Therapy  [facility] for tasks assessed/performed       Past Medical History:  Diagnosis Date  . Back pain, chronic   . Benign localized prostatic hyperplasia with lower urinary tract symptoms (LUTS)   . Depression   . Diverticulosis of colon   . Elevated PSA   . Fatty liver   . GERD (gastroesophageal reflux disease)   . Hiatal hernia   . History of alcohol abuse    quit 1989  . History of esophageal stricture    2003  S/P  DILATATION  . History of esophagitis   . History of melanoma in situ    06/ 2017  REMOVAL RIGHT CHEEK AREA  . History of sepsis 06/06/2016   admitted for urosepsis , UTI due to pseudomonas-- resolved   . Hx of adenomatous colonic polyps    2003   TUBULAR ADENOMA  . Hyperlipidemia   . Hypertension   . Mild sleep apnea    2004 mild, per sleep study-- no cpap recommended  (pt denies)  . Urinary retention     Past Surgical History:  Procedure Laterality Date  . CARDIOVASCULAR STRESS TEST  04-17-2015  dr Claiborne Billings   Normal stress nulcear study w/ a small, mild, fixed inferior defect consistent with inferior thinning; no ischemia; normal LV function and wall motion , stress ef 56%  . COLONOSCOPY  last one 12-02-2006  . EXCISION LEFT ARM MASS  1988  . MOHS SURGERY  09/06/2015   right parotidemomasseteric  excision w/ complex repair for melanoma in situ  . THULIUM LASER TURP (TRANSURETHRAL RESECTION OF PROSTATE) N/A 07/29/2016   Procedure: Marcelino Duster LASER TURP (TRANSURETHRAL RESECTION OF PROSTATE)/ TRANSRECTAL ULTRASOUND GUIDED PROSTATE BIOPSY;  Surgeon: Nickie Retort, MD;  Location[facility];  Service: Urology;  Laterality: N/A;  . TONSILLECTOMY AND ADENOIDECTOMY  child  . TRANSTHORACIC ECHOCARDIOGRAM  04/16/2015   moderate LVH,  ef 88-50%, grade 1 diastolic dysfunction/  trivial AR, MR and PR/  mild dilated ascending aorta/  mild TR  . UPPER GASTROINTESTINAL ENDOSCOPY  last one 02-15-2014    There were no vitals filed for this visit.  Subjective Assessment - 09/24/17 1449    Subjective  Pt. noting some increased soreness at L shoulder following last visit which recovered today.      Pertinent History  L shoulder surgery, chronic back pain, GERD, hiatal hernia, Hx of sepsis, HLD, HTN    Diagnostic tests  None    Patient Stated Goals  get rid of this pain    Currently in Pain?  No/denies    Pain Score  0-No pain    Multiple Pain Sites  No  Device    Consulted and Agree with Plan of Care  Patient       Patient will benefit from skilled therapeutic intervention in order to improve the following deficits and impairments:  Hypomobility, Decreased scar mobility, Pain, Impaired UE functional use, Decreased strength, Decreased activity tolerance, Improper body mechanics, Decreased range of motion, Impaired flexibility, Postural dysfunction  Visit Diagnosis: Chronic left shoulder pain  Stiffness of left shoulder, not elsewhere classified  Muscle weakness (generalized)     Problem List Patient Active Problem List   Diagnosis Date Noted  . Left shoulder pain 09/04/2017  . BPH (benign prostatic hyperplasia) 09/17/2016  . Melanoma of skin ([place]) 09/26/2015  . Mild obesity 06/05/2015  . Hypertensive left ventricular hypertrophy, without heart failure 06/05/2015  . 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  . HTN (hypertension) 09/01/2011  . Elevated PSA 05/07/2011  . GERD (gastroesophageal reflux disease) 01/14/2011  . Family history of colon cancer 01/14/2011  . History of colon polyps 01/14/2011  . Fatty liver disease, nonalcoholic 01/00/7121  . ESOPHAGEAL STRICTURE 11/27/2008  . COLONIC POLYPS, ADENOMATOUS, HX OF 11/27/2008  . ALCOHOL ABUSE, HX OF 09/16/2007  . DIVERTICULOSIS, COLON 07/16/2007  . BACK PAIN, CHRONIC 07/16/2007  . Hyperlipidemia 08/10/2006    Bess Harvest, PTA 09/24/17 6:12 PM   [facility] [address]  [place], [place], [address] Phone: [phone]   Fax:  [phone]  Name: Gerald [name] MRN: [id number] Date of Birth: 1945-05-17  Device    Consulted and Agree with Plan of Care  Patient       Patient will benefit from skilled therapeutic intervention in order to improve the following deficits and impairments:  Hypomobility, Decreased scar mobility, Pain, Impaired UE functional use, Decreased strength, Decreased activity tolerance, Improper body mechanics, Decreased range of motion, Impaired flexibility, Postural dysfunction  Visit Diagnosis: Chronic left shoulder pain  Stiffness of left shoulder, not elsewhere classified  Muscle weakness (generalized)     Problem List Patient Active Problem List   Diagnosis Date Noted  . Left shoulder pain 09/04/2017  . BPH (benign prostatic hyperplasia) 09/17/2016  . Melanoma of skin ([place]) 09/26/2015  . Mild obesity 06/05/2015  . Hypertensive left ventricular hypertrophy, without heart failure 06/05/2015  . 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  . HTN (hypertension) 09/01/2011  . Elevated PSA 05/07/2011  . GERD (gastroesophageal reflux disease) 01/14/2011  . Family history of colon cancer 01/14/2011  . History of colon polyps 01/14/2011  . Fatty liver disease, nonalcoholic 01/00/7121  . ESOPHAGEAL STRICTURE 11/27/2008  . COLONIC POLYPS, ADENOMATOUS, HX OF 11/27/2008  . ALCOHOL ABUSE, HX OF 09/16/2007  . DIVERTICULOSIS, COLON 07/16/2007  . BACK PAIN, CHRONIC 07/16/2007  . Hyperlipidemia 08/10/2006    Bess Harvest, PTA 09/24/17 6:12 PM   [facility] [address]  [place], [place], [address] Phone: [phone]   Fax:  [phone]  Name: Gerald [name] MRN: [id number] Date of Birth: 1945-05-17

## 2017-09-28 ENCOUNTER — Encounter: Payer: Medicare Other | Admitting: Physical Therapy

## 2017-09-29 ENCOUNTER — Ambulatory Visit: Payer: Medicare Other | Attending: Family Medicine

## 2017-09-29 DIAGNOSIS — M25512 Pain in left shoulder: Secondary | ICD-10-CM | POA: Insufficient documentation

## 2017-09-29 DIAGNOSIS — G8929 Other chronic pain: Secondary | ICD-10-CM | POA: Diagnosis not present

## 2017-09-29 DIAGNOSIS — M6281 Muscle weakness (generalized): Secondary | ICD-10-CM | POA: Diagnosis not present

## 2017-09-29 DIAGNOSIS — M25612 Stiffness of left shoulder, not elsewhere classified: Secondary | ICD-10-CM | POA: Diagnosis not present

## 2017-09-29 NOTE — Therapy (Signed)
[facility] [address]  [place], [place], [address] Phone: [phone]   Fax:  [phone]  Physical Therapy Treatment  Patient Details  Name: Gerald [name] MRN: [id number] Date of Birth: 12-27-1945 Referring Provider: Karlton Lemon, MD   Encounter Date: 09/29/2017  PT End of Session - 09/29/17 1454    Visit Number  4    Number of Visits  13    Date for PT Re-Evaluation  10/26/17    Authorization Type  [employer]    PT Start Time  1453    PT Stop Time  1531    PT Time Calculation (min)  38 min    Activity Tolerance  Patient tolerated treatment well    Behavior During Therapy  [facility] for tasks assessed/performed       Past Medical History:  Diagnosis Date  . Back pain, chronic   . Benign localized prostatic hyperplasia with lower urinary tract symptoms (LUTS)   . Depression   . Diverticulosis of colon   . Elevated PSA   . Fatty liver   . GERD (gastroesophageal reflux disease)   . Hiatal hernia   . History of alcohol abuse    quit 1989  . History of esophageal stricture    2003  S/P  DILATATION  . History of esophagitis   . History of melanoma in situ    06/ 2017  REMOVAL RIGHT CHEEK AREA  . History of sepsis 06/06/2016   admitted for urosepsis , UTI due to pseudomonas-- resolved   . Hx of adenomatous colonic polyps    2003   TUBULAR ADENOMA  . Hyperlipidemia   . Hypertension   . Mild sleep apnea    2004 mild, per sleep study-- no cpap recommended  (pt denies)  . Urinary retention     Past Surgical History:  Procedure Laterality Date  . CARDIOVASCULAR STRESS TEST  04-17-2015  dr Claiborne Billings   Normal stress nulcear study w/ a small, mild, fixed inferior defect consistent with inferior thinning; no ischemia; normal LV function and wall motion , stress ef 56%  . COLONOSCOPY  last one 12-02-2006  . EXCISION LEFT ARM MASS  1988  . MOHS SURGERY  09/06/2015   right parotidemomasseteric  excision w/ complex repair for melanoma in situ  . THULIUM LASER TURP (TRANSURETHRAL RESECTION OF PROSTATE) N/A 07/29/2016   Procedure: Marcelino Duster LASER TURP (TRANSURETHRAL RESECTION OF PROSTATE)/ TRANSRECTAL ULTRASOUND GUIDED PROSTATE BIOPSY;  Surgeon: Nickie Retort, MD;  Location: [facility];  Service: Urology;  Laterality: N/A;  . TONSILLECTOMY AND ADENOIDECTOMY  child  . TRANSTHORACIC ECHOCARDIOGRAM  04/16/2015   moderate LVH,  ef 80-32%, grade 1 diastolic dysfunction/  trivial AR, MR and PR/  mild dilated ascending aorta/  mild TR  . UPPER GASTROINTESTINAL ENDOSCOPY  last one 02-15-2014    There were no vitals filed for this visit.  Subjective Assessment - 09/29/17 1542    Subjective  Notes he has not had sharp shoulder pain in two weeks.      Pertinent History  L shoulder surgery, chronic back pain, GERD, hiatal hernia, Hx of sepsis, HLD, HTN    Diagnostic tests  None    Patient Stated Goals  get rid of this pain    Currently in Pain?  No/denies    Pain Score  0-No pain    Multiple Pain Sites  No         [facility]  has not had "sharp" shoulder pain in over two weeks.  Tolerated progression of scapular/RTC strengthening activities well today with addition of rhythmic stabilization.  Progressing well.      PT Treatment/Interventions  ADLs/Self Care Home Management;Cryotherapy;Electrical Stimulation;Iontophoresis 4mg /ml Dexamethasone;Moist Heat;Ultrasound;Functional mobility training;Therapeutic  activities;Therapeutic exercise;Manual techniques;Patient/family education;Scar mobilization;Passive range of motion;Dry needling;Energy conservation;Splinting;Taping;Vasopneumatic Device    PT Next Visit Plan  Scapular and RTC strengthening    Consulted and Agree with Plan of Care  Patient       Patient will benefit from skilled therapeutic intervention in order to improve the following deficits and impairments:  Hypomobility, Decreased scar mobility, Pain, Impaired UE functional use, Decreased strength, Decreased activity tolerance, Improper body mechanics, Decreased range of motion, Impaired flexibility, Postural dysfunction  Visit Diagnosis: Chronic left shoulder pain  Stiffness of left shoulder, not elsewhere classified  Muscle weakness (generalized)     Problem List Patient Active Problem List   Diagnosis Date Noted  . Left shoulder pain 09/04/2017  . BPH (benign prostatic hyperplasia) 09/17/2016  . Melanoma of skin ([place]) 09/26/2015  . Mild obesity 06/05/2015  . Hypertensive left ventricular hypertrophy, without heart failure 06/05/2015  . 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  . HTN (hypertension) 09/01/2011  . Elevated PSA 05/07/2011  . GERD (gastroesophageal reflux disease) 01/14/2011  . Family history of colon cancer 01/14/2011  . History of colon polyps 01/14/2011  . Fatty liver disease, nonalcoholic 70/14/1030  . ESOPHAGEAL STRICTURE 11/27/2008  . COLONIC POLYPS, ADENOMATOUS, HX OF 11/27/2008  . ALCOHOL ABUSE, HX OF 09/16/2007  . DIVERTICULOSIS, COLON 07/16/2007  . BACK PAIN, CHRONIC 07/16/2007  . Hyperlipidemia 08/10/2006    Bess Harvest, PTA 09/29/17 3:49 PM   [facility] [address]  [place], [place], [address] Phone: [phone]   Fax:  [phone]  Name: Gerald [name] MRN: [id number] Date of Birth: September 01, 1945  has not had "sharp" shoulder pain in over two weeks.  Tolerated progression of scapular/RTC strengthening activities well today with addition of rhythmic stabilization.  Progressing well.      PT Treatment/Interventions  ADLs/Self Care Home Management;Cryotherapy;Electrical Stimulation;Iontophoresis 4mg /ml Dexamethasone;Moist Heat;Ultrasound;Functional mobility training;Therapeutic  activities;Therapeutic exercise;Manual techniques;Patient/family education;Scar mobilization;Passive range of motion;Dry needling;Energy conservation;Splinting;Taping;Vasopneumatic Device    PT Next Visit Plan  Scapular and RTC strengthening    Consulted and Agree with Plan of Care  Patient       Patient will benefit from skilled therapeutic intervention in order to improve the following deficits and impairments:  Hypomobility, Decreased scar mobility, Pain, Impaired UE functional use, Decreased strength, Decreased activity tolerance, Improper body mechanics, Decreased range of motion, Impaired flexibility, Postural dysfunction  Visit Diagnosis: Chronic left shoulder pain  Stiffness of left shoulder, not elsewhere classified  Muscle weakness (generalized)     Problem List Patient Active Problem List   Diagnosis Date Noted  . Left shoulder pain 09/04/2017  . BPH (benign prostatic hyperplasia) 09/17/2016  . Melanoma of skin (Divide) 09/26/2015  . Mild obesity 06/05/2015  . Hypertensive left ventricular hypertrophy, without heart failure 06/05/2015  . 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  . HTN (hypertension) 09/01/2011  . Elevated PSA 05/07/2011  . GERD (gastroesophageal reflux disease) 01/14/2011  . Family history of colon cancer 01/14/2011  . History of colon polyps 01/14/2011  . Fatty liver disease, nonalcoholic 70/14/1030  . ESOPHAGEAL STRICTURE 11/27/2008  . COLONIC POLYPS, ADENOMATOUS, HX OF 11/27/2008  . ALCOHOL ABUSE, HX OF 09/16/2007  . DIVERTICULOSIS, COLON 07/16/2007  . BACK PAIN, CHRONIC 07/16/2007  . Hyperlipidemia 08/10/2006    Bess Harvest, PTA 09/29/17 3:49 PM   [facility] [address]  [place], [place], [address] Phone: [phone]   Fax:  [phone]  Name: Gerald [name] MRN: [id number] Date of Birth: September 01, 1945

## 2017-09-30 ENCOUNTER — Ambulatory Visit: Payer: Medicare Other | Admitting: Physical Therapy

## 2017-09-30 DIAGNOSIS — H527 Unspecified disorder of refraction: Secondary | ICD-10-CM | POA: Diagnosis not present

## 2017-09-30 DIAGNOSIS — H2513 Age-related nuclear cataract, bilateral: Secondary | ICD-10-CM | POA: Diagnosis not present

## 2017-10-07 ENCOUNTER — Encounter: Payer: Self-pay | Admitting: Physical Therapy

## 2017-10-07 ENCOUNTER — Ambulatory Visit: Payer: Medicare Other | Admitting: Physical Therapy

## 2017-10-07 DIAGNOSIS — M6281 Muscle weakness (generalized): Secondary | ICD-10-CM

## 2017-10-07 DIAGNOSIS — G8929 Other chronic pain: Secondary | ICD-10-CM

## 2017-10-07 DIAGNOSIS — M25512 Pain in left shoulder: Secondary | ICD-10-CM | POA: Diagnosis not present

## 2017-10-07 DIAGNOSIS — M25612 Stiffness of left shoulder, not elsewhere classified: Secondary | ICD-10-CM | POA: Diagnosis not present

## 2017-10-07 NOTE — Therapy (Signed)
motion, Impaired flexibility, Postural dysfunction  Visit Diagnosis: Chronic left shoulder pain  Muscle weakness (generalized)  Stiffness of left shoulder, not elsewhere classified     Problem List Patient Active Problem List   Diagnosis Date Noted  . Left shoulder pain 09/04/2017  . BPH (benign prostatic hyperplasia) 09/17/2016  . Melanoma of skin ([place]) 09/26/2015  . Mild obesity 06/05/2015  . Hypertensive left ventricular hypertrophy, without heart failure 06/05/2015  . 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  . HTN (hypertension) 09/01/2011  . Elevated PSA 05/07/2011  . GERD (gastroesophageal reflux disease) 01/14/2011  . Family history of colon cancer 01/14/2011   . History of colon polyps 01/14/2011  . Fatty liver disease, nonalcoholic 95/63/8756  . ESOPHAGEAL STRICTURE 11/27/2008  . COLONIC POLYPS, ADENOMATOUS, HX OF 11/27/2008  . ALCOHOL ABUSE, HX OF 09/16/2007  . DIVERTICULOSIS, COLON 07/16/2007  . BACK PAIN, CHRONIC 07/16/2007  . Hyperlipidemia 08/10/2006    Janene Harvey, PT, DPT 10/07/17 3:44 PM   [facility] [address]  [address] [place], [place], [address] Phone: [phone]   Fax:  [phone]  Name: LIAN TANORI MRN: [id number] Date of Birth: 07-16-1945  [facility] [address]  [address] [place], [place], [address] Phone: [phone]   Fax:  [phone]  Physical Therapy Treatment  Patient Details  Name: Gerald [name] MRN: [id number] Date of Birth: 03/26/1946 Referring Provider: Shane Hudnall, MD   Encounter Date: 10/07/2017  PT End of Session - 10/07/17 1534    Visit Number  5    Number of Visits  13    Date for PT Re-Evaluation  10/26/17    Authorization Type  [employer]    PT Start Time  1445    PT Stop Time  1530    PT Time Calculation (min)  45 min    Activity Tolerance  Patient tolerated treatment well;Patient limited by pain    Behavior During Therapy  WFL for tasks assessed/performed       Past Medical History:  Diagnosis Date  . Back pain, chronic   . Benign localized prostatic hyperplasia with lower urinary tract symptoms (LUTS)   . Depression   . Diverticulosis of colon   . Elevated PSA   . Fatty liver   . GERD (gastroesophageal reflux disease)   . Hiatal hernia   . History of alcohol abuse    quit 1989  . History of esophageal stricture    2003  S/P  DILATATION  . History of esophagitis   . History of melanoma in situ    06/ 2017  REMOVAL RIGHT CHEEK AREA  . History of sepsis 06/06/2016   admitted for urosepsis , UTI due to pseudomonas-- resolved   . Hx of adenomatous colonic polyps    2003   TUBULAR ADENOMA  . Hyperlipidemia   . Hypertension   . Mild sleep apnea    2004 mild, per sleep study-- no cpap recommended  (pt denies)  . Urinary retention     Past Surgical History:  Procedure Laterality Date  . CARDIOVASCULAR STRESS TEST  04-17-2015  dr kelly   Normal stress nulcear study w/ a small, mild, fixed inferior defect consistent with inferior thinning; no ischemia; normal LV function and wall motion , stress ef 56%  . COLONOSCOPY  last one 12-02-2006  . EXCISION LEFT ARM MASS  1988  . MOHS SURGERY  09/06/2015   right  parotidemomasseteric excision w/ complex repair for melanoma in situ  . THULIUM LASER TURP (TRANSURETHRAL RESECTION OF PROSTATE) N/A 07/29/2016   Procedure: THULIUM LASER TURP (TRANSURETHRAL RESECTION OF PROSTATE)/ TRANSRECTAL ULTRASOUND GUIDED PROSTATE BIOPSY;  Surgeon: Brian James Budzyn, MD;  Location: [facility];  Service: Urology;  Laterality: N/A;  . TONSILLECTOMY AND ADENOIDECTOMY  child  . TRANSTHORACIC ECHOCARDIOGRAM  04/16/2015   moderate LVH,  ef 60-65%, grade 1 diastolic dysfunction/  trivial AR, MR and PR/  mild dilated ascending aorta/  mild TR  . UPPER GASTROINTESTINAL ENDOSCOPY  last one 02-15-2014    There were no vitals filed for this visit.  Subjective Assessment - 10/07/17 1450    Subjective  Reports that shoulder pain is better, but has now moved to forearm. Wakes up with dull pain but no sharp pain. Is happy with his progress.    Pertinent History  L shoulder surgery, chronic back pain, GERD, hiatal hernia, Hx of sepsis, HLD, HTN    Diagnostic tests  None    Patient Stated Goals  get rid of this pain    Currently in Pain?  Yes    Pain Score  2       motion, Impaired flexibility, Postural dysfunction  Visit Diagnosis: Chronic left shoulder pain  Muscle weakness (generalized)  Stiffness of left shoulder, not elsewhere classified     Problem List Patient Active Problem List   Diagnosis Date Noted  . Left shoulder pain 09/04/2017  . BPH (benign prostatic hyperplasia) 09/17/2016  . Melanoma of skin ([facility]) 09/26/2015  . Mild obesity 06/05/2015  . Hypertensive left ventricular hypertrophy, without heart failure 06/05/2015  . 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  . HTN (hypertension) 09/01/2011  . Elevated PSA 05/07/2011  . GERD (gastroesophageal reflux disease) 01/14/2011  . Family history of colon cancer 01/14/2011   . History of colon polyps 01/14/2011  . Fatty liver disease, nonalcoholic 01/14/2011  . ESOPHAGEAL STRICTURE 11/27/2008  . COLONIC POLYPS, ADENOMATOUS, HX OF 11/27/2008  . ALCOHOL ABUSE, HX OF 09/16/2007  . DIVERTICULOSIS, COLON 07/16/2007  . BACK PAIN, CHRONIC 07/16/2007  . Hyperlipidemia 08/10/2006    Gerald [name], PT, DPT 10/07/17 3:44 PM   [facility] [address]  [address] [place], [place], [address] Phone: [phone]   Fax:  [phone]  Name: Gerald [name] MRN: [id number] Date of Birth: 12/16/1945     [facility] [address]  [address] [place], [place], [address] Phone: [phone]   Fax:  [phone]  Physical Therapy Treatment  Patient Details  Name: Gerald [name] MRN: [id number] Date of Birth: 03/26/1946 Referring Provider: Shane Hudnall, MD   Encounter Date: 10/07/2017  PT End of Session - 10/07/17 1534    Visit Number  5    Number of Visits  13    Date for PT Re-Evaluation  10/26/17    Authorization Type  [employer]    PT Start Time  1445    PT Stop Time  1530    PT Time Calculation (min)  45 min    Activity Tolerance  Patient tolerated treatment well;Patient limited by pain    Behavior During Therapy  WFL for tasks assessed/performed       Past Medical History:  Diagnosis Date  . Back pain, chronic   . Benign localized prostatic hyperplasia with lower urinary tract symptoms (LUTS)   . Depression   . Diverticulosis of colon   . Elevated PSA   . Fatty liver   . GERD (gastroesophageal reflux disease)   . Hiatal hernia   . History of alcohol abuse    quit 1989  . History of esophageal stricture    2003  S/P  DILATATION  . History of esophagitis   . History of melanoma in situ    06/ 2017  REMOVAL RIGHT CHEEK AREA  . History of sepsis 06/06/2016   admitted for urosepsis , UTI due to pseudomonas-- resolved   . Hx of adenomatous colonic polyps    2003   TUBULAR ADENOMA  . Hyperlipidemia   . Hypertension   . Mild sleep apnea    2004 mild, per sleep study-- no cpap recommended  (pt denies)  . Urinary retention     Past Surgical History:  Procedure Laterality Date  . CARDIOVASCULAR STRESS TEST  04-17-2015  dr kelly   Normal stress nulcear study w/ a small, mild, fixed inferior defect consistent with inferior thinning; no ischemia; normal LV function and wall motion , stress ef 56%  . COLONOSCOPY  last one 12-02-2006  . EXCISION LEFT ARM MASS  1988  . MOHS SURGERY  09/06/2015   right  parotidemomasseteric excision w/ complex repair for melanoma in situ  . THULIUM LASER TURP (TRANSURETHRAL RESECTION OF PROSTATE) N/A 07/29/2016   Procedure: THULIUM LASER TURP (TRANSURETHRAL RESECTION OF PROSTATE)/ TRANSRECTAL ULTRASOUND GUIDED PROSTATE BIOPSY;  Surgeon: Brian James Budzyn, MD;  Location: [facility];  Service: Urology;  Laterality: N/A;  . TONSILLECTOMY AND ADENOIDECTOMY  child  . TRANSTHORACIC ECHOCARDIOGRAM  04/16/2015   moderate LVH,  ef 60-65%, grade 1 diastolic dysfunction/  trivial AR, MR and PR/  mild dilated ascending aorta/  mild TR  . UPPER GASTROINTESTINAL ENDOSCOPY  last one 02-15-2014    There were no vitals filed for this visit.  Subjective Assessment - 10/07/17 1450    Subjective  Reports that shoulder pain is better, but has now moved to forearm. Wakes up with dull pain but no sharp pain. Is happy with his progress.    Pertinent History  L shoulder surgery, chronic back pain, GERD, hiatal hernia, Hx of sepsis, HLD, HTN    Diagnostic tests  None    Patient Stated Goals  get rid of this pain    Currently in Pain?  Yes    Pain Score  2    

## 2017-10-08 ENCOUNTER — Ambulatory Visit: Payer: Medicare Other | Admitting: Family Medicine

## 2017-10-08 DIAGNOSIS — N35919 Unspecified urethral stricture, male, unspecified site: Secondary | ICD-10-CM | POA: Diagnosis not present

## 2017-10-08 DIAGNOSIS — N401 Enlarged prostate with lower urinary tract symptoms: Secondary | ICD-10-CM | POA: Diagnosis not present

## 2017-10-08 DIAGNOSIS — R310 Gross hematuria: Secondary | ICD-10-CM | POA: Diagnosis not present

## 2017-10-08 DIAGNOSIS — R972 Elevated prostate specific antigen [PSA]: Secondary | ICD-10-CM | POA: Diagnosis not present

## 2017-10-08 DIAGNOSIS — R351 Nocturia: Secondary | ICD-10-CM | POA: Diagnosis not present

## 2017-10-09 ENCOUNTER — Other Ambulatory Visit: Payer: Self-pay | Admitting: Urology

## 2017-10-13 ENCOUNTER — Encounter: Payer: Self-pay | Admitting: Family Medicine

## 2017-10-13 ENCOUNTER — Ambulatory Visit (INDEPENDENT_AMBULATORY_CARE_PROVIDER_SITE_OTHER): Payer: Medicare Other | Admitting: Family Medicine

## 2017-10-13 DIAGNOSIS — M25512 Pain in left shoulder: Secondary | ICD-10-CM | POA: Diagnosis not present

## 2017-10-13 NOTE — Patient Instructions (Signed)
Go to your therapy appointment tomorrow then transition to just home exercises. Do home exercises 3 times a week for 4 more weeks. Follow up with me as needed.

## 2017-10-13 NOTE — Assessment & Plan Note (Signed)
2/2 strain of supraspinatus and infraspinatus muscles with impingement.  Clinically improved at this point with physical therapy and home exercises - continue HEP for 4 more weeks.  Finish out physical therapy tomorrow.  Aleve if needed.  F/u prn.

## 2017-10-13 NOTE — Progress Notes (Signed)
PCP and consultation requested by: Debbrah Alar, NP  Subjective:   HPI: Patient is a 72 y.o. male here for left shoulder pain.  6/5: Patient reports he was lifting dirt about 8 weeks ago. No acute injury but after this developed worsening lateral left shoulder pain about 2-3 days later. Worse with overhead motions, lifting things. Pain is 1/10 but up to 10/10 and sharp at times. No skin changes, numbness.  7/16: Patient reports he's doing much better. Has been doing physical therapy and home exercises. Pain level 0/10. Able to reach behind back now. No skin changes. No new injuries.  Past Medical History:  Diagnosis Date  . Back pain, chronic   . Benign localized prostatic hyperplasia with lower urinary tract symptoms (LUTS)   . Depression   . Diverticulosis of colon   . Elevated PSA   . Fatty liver   . GERD (gastroesophageal reflux disease)   . Hiatal hernia   . History of alcohol abuse    quit 1989  . History of esophageal stricture    2003  S/P  DILATATION  . History of esophagitis   . History of melanoma in situ    06/ 2017  REMOVAL RIGHT CHEEK AREA  . History of sepsis 06/06/2016   admitted for urosepsis , UTI due to pseudomonas-- resolved   . Hx of adenomatous colonic polyps    2003   TUBULAR ADENOMA  . Hyperlipidemia   . Hypertension   . Mild sleep apnea    2004 mild, per sleep study-- no cpap recommended  (pt denies)  . Urinary retention     Current Outpatient Medications on File Prior to Visit  Medication Sig Dispense Refill  . aspirin EC 81 MG tablet Take 81 mg by mouth every other day.    . Ciclopirox 1 % shampoo APP TO SCALP UTD AND PRN  2  . ketoconazole (NIZORAL) 2 % shampoo Apply 1 application topically as needed for irritation. For eczema    . losartan (COZAAR) 100 MG tablet TAKE 1 TABLET(100 MG) BY MOUTH DAILY 90 tablet 1  . Magnesium Malate 1250 (141.7 Mg) MG TABS Take 1 tablet by mouth 3 (three) times a week.    . metoprolol tartrate  (LOPRESSOR) 25 MG tablet TAKE 1 TABLET BY MOUTH TWICE DAILY 180 tablet 0  . Multiple Vitamins-Minerals (MULTIVITAMIN ADULTS 50+ PO) Take 1 tablet by mouth daily.    . Omega-3 Fatty Acids (FISH OIL) 1200 MG CAPS Take 2 capsules (2,400 mg total) by mouth 2 (two) times daily.     Current Facility-Administered Medications on File Prior to Visit  Medication Dose Route Frequency Provider Last Rate Last Dose  . 0.9 %  sodium chloride infusion  500 mL Intravenous Continuous Milus Banister, MD        Past Surgical History:  Procedure Laterality Date  . CARDIOVASCULAR STRESS TEST  04-17-2015  dr Claiborne Billings   Normal stress nulcear study w/ a small, mild, fixed inferior defect consistent with inferior thinning; no ischemia; normal LV function and wall motion , stress ef 56%  . COLONOSCOPY  last one 12-02-2006  . EXCISION LEFT ARM MASS  1988  . MOHS SURGERY  09/06/2015   right parotidemomasseteric excision w/ complex repair for melanoma in situ  . THULIUM LASER TURP (TRANSURETHRAL RESECTION OF PROSTATE) N/A 07/29/2016   Procedure: Marcelino Duster LASER TURP (TRANSURETHRAL RESECTION OF PROSTATE)/ TRANSRECTAL ULTRASOUND GUIDED PROSTATE BIOPSY;  Surgeon: Nickie Retort, MD;  Location: [facility];  Service: Urology;  Laterality: N/A;  . TONSILLECTOMY AND ADENOIDECTOMY  child  . TRANSTHORACIC ECHOCARDIOGRAM  04/16/2015   moderate LVH,  ef 37-16%, grade 1 diastolic dysfunction/  trivial AR, MR and PR/  mild dilated ascending aorta/  mild TR  . UPPER GASTROINTESTINAL ENDOSCOPY  last one 02-15-2014    Allergies  Allergen Reactions  . Augmentin [Amoxicillin-Pot Clavulanate] Diarrhea    *Can take plain Amoxicillin* Has patient had a PCN reaction causing immediate rash, facial/tongue/throat swelling, SOB or lightheadedness with hypotension: No Has patient had a PCN reaction causing severe rash involving mucus membranes or skin necrosis: No Has patient had a PCN reaction that required hospitalization  No Has patient had a PCN reaction occurring within the last 10 years: Yes If all of the above answers are "NO", then may proceed with Cephalosporin use.   Marland Kitchen Penicillins Rash    Childhood reaction *Can take Amoxicillin* Has patient had a PCN reaction causing immediate rash, facial/tongue/throat swelling, SOB or lightheadedness with hypotension: Unknown Has patient had a PCN reaction causing severe rash involving mucus membranes or skin necrosis: Unknown Has patient had a PCN reaction that required hospitalization: Unknown Has patient had a PCN reaction occurring within the last 10 years: Unknown  If all of the above answers are "NO", then may proceed with Cephalosporin use    Social History   Socioeconomic History  . Marital status: Married    Spouse name: Not on file  . Number of children: 2  . Years of education: Not on file  . Highest education level: Not on file  Occupational History  . Occupation: Retired  Scientific laboratory technician  . Financial resource strain: Not on file  . Food insecurity:    Worry: Not on file    Inability: Not on file  . Transportation needs:    Medical: Not on file    Non-medical: Not on file  Tobacco Use  . Smoking status: Former Smoker    Packs/day: 2.00    Years: 30.00    Pack years: 60.00    Types: Cigarettes    Last attempt to quit: 08/29/2006    Years since quitting: 11.1  . Smokeless tobacco: Never Used  Substance and Sexual Activity  . Alcohol use: No  . Drug use: No  . Sexual activity: Yes  Lifestyle  . Physical activity:    Days per week: Not on file    Minutes per session: Not on file  . Stress: Not on file  Relationships  . Social connections:    Talks on phone: Not on file    Gets together: Not on file    Attends religious service: Not on file    Active member of club or organization: Not on file    Attends meetings of clubs or organizations: Not on file    Relationship status: Not on file  . Intimate partner violence:    Fear of  current or ex partner: Not on file    Emotionally abused: Not on file    Physically abused: Not on file    Forced sexual activity: Not on file  Other Topics Concern  . Not on file  Social History Narrative   Diet: regular, lots of vegetables-----exercise : none but active   Caffeine use:  5 cups coffee daily          Family History  Problem Relation Age of Onset  . Coronary artery disease Father   . Colon cancer Father        ?  unknown age dx/died at 67  . Heart attack Paternal Grandfather   . Coronary artery disease Maternal Grandmother   . Colon cancer Maternal Grandmother 74  . Cancer Maternal Grandmother        colon  . Melanoma Mother   . Coronary artery disease Paternal Grandmother   . Heart attack Maternal Grandfather   . Hypertension Maternal Aunt   . Prostate cancer Neg Hx   . Stroke Neg Hx   . Esophageal cancer Neg Hx   . Pancreatic cancer Neg Hx   . Rectal cancer Neg Hx   . Stomach cancer Neg Hx     BP (!) 144/72   Pulse (!) 56   Ht 5\' 7"  (1.702 m)   Wt 175 lb (79.4 kg)   BMI 27.41 kg/m   Review of Systems: See HPI above.     Objective:  Physical Exam:  Gen: NAD, comfortable in exam room  Left shoulder: No swelling, ecchymoses.  No gross deformity. No TTP. FROM without painful arc. Negative Hawkins, Neers. Negative Yergasons. Strength 5/5 with empty can and resisted internal/external rotation. Negative apprehension. NV intact distally.   Assessment & Plan:  1. Left shoulder pain - 2/2 strain of supraspinatus and infraspinatus muscles with impingement.  Clinically improved at this point with physical therapy and home exercises - continue HEP for 4 more weeks.  Finish out physical therapy tomorrow.  Aleve if needed.  F/u prn.

## 2017-10-14 ENCOUNTER — Encounter: Payer: Self-pay | Admitting: Physical Therapy

## 2017-10-14 ENCOUNTER — Ambulatory Visit: Payer: Medicare Other | Admitting: Physical Therapy

## 2017-10-14 DIAGNOSIS — M6281 Muscle weakness (generalized): Secondary | ICD-10-CM

## 2017-10-14 DIAGNOSIS — G8929 Other chronic pain: Secondary | ICD-10-CM | POA: Diagnosis not present

## 2017-10-14 DIAGNOSIS — M25612 Stiffness of left shoulder, not elsewhere classified: Secondary | ICD-10-CM

## 2017-10-14 DIAGNOSIS — M25512 Pain in left shoulder: Secondary | ICD-10-CM | POA: Diagnosis not present

## 2017-10-14 NOTE — Therapy (Addendum)
[facility] [address]  [place], [place], [address] Phone: [phone]   Fax:  [phone]  Physical Therapy Treatment  Patient Details  Name: Gerald [name] MRN: [id number] Date of Birth: 24-Apr-1945 Referring Provider: Karlton Lemon, MD   Progress Note Reporting Period 09/14/17 to 10/14/17  See note below for Objective Data and Assessment of Progress/Goals.    Encounter Date: 10/14/2017  PT End of Session - 10/14/17 1441    Visit Number  6    Number of Visits  13    Date for PT Re-Evaluation  10/26/17    Authorization Type  Med[employer] & [employer]    PT Start Time  1349    PT Stop Time  1435    PT Time Calculation (min)  46 min    Activity Tolerance  Patient tolerated treatment well    Behavior During Therapy  WFL for tasks assessed/performed       Past Medical History:  Diagnosis Date  . Back pain, chronic   . Benign localized prostatic hyperplasia with lower urinary tract symptoms (LUTS)   . Depression   . Diverticulosis of colon   . Elevated PSA   . Fatty liver   . GERD (gastroesophageal reflux disease)   . Hiatal hernia   . History of alcohol abuse    quit 1989  . History of esophageal stricture    2003  S/P  DILATATION  . History of esophagitis   . History of melanoma in situ    06/ 2017  REMOVAL RIGHT CHEEK AREA  . History of sepsis 06/06/2016   admitted for urosepsis , UTI due to pseudomonas-- resolved   . Hx of adenomatous colonic polyps    2003   TUBULAR ADENOMA  . Hyperlipidemia   . Hypertension   . Mild sleep apnea    2004 mild, per sleep study-- no cpap recommended  (pt denies)  . Urinary retention     Past Surgical History:  Procedure Laterality Date  . CARDIOVASCULAR STRESS TEST  04-17-2015  dr Claiborne Billings   Normal stress nulcear study w/ a small, mild, fixed inferior defect consistent with inferior thinning; no ischemia; normal LV function and wall motion , stress ef 56%   . COLONOSCOPY  last one 12-02-2006  . EXCISION LEFT ARM MASS  1988  . MOHS SURGERY  09/06/2015   right parotidemomasseteric excision w/ complex repair for melanoma in situ  . THULIUM LASER TURP (TRANSURETHRAL RESECTION OF PROSTATE) N/A 07/29/2016   Procedure: Marcelino Duster LASER TURP (TRANSURETHRAL RESECTION OF PROSTATE)/ TRANSRECTAL ULTRASOUND GUIDED PROSTATE BIOPSY;  Surgeon: Nickie Retort, MD;  Location: [facility];  Service: Urology;  Laterality: N/A;  . TONSILLECTOMY AND ADENOIDECTOMY  child  . TRANSTHORACIC ECHOCARDIOGRAM  04/16/2015   moderate LVH,  ef 80-99%, grade 1 diastolic dysfunction/  trivial AR, MR and PR/  mild dilated ascending aorta/  mild TR  . UPPER GASTROINTESTINAL ENDOSCOPY  last one 02-15-2014    There were no vitals filed for this visit.  Subjective Assessment - 10/14/17 1348    Subjective  Reports he went to the MD yesterda who told him to discontinue PT. Reports 100% improvement in R shoulder since intiial eval. Can do everything he needs to do and can reach up into cabinet. Reports that he dropped his HEP exercises down to yellow band because the resistance was hurting him.     Pertinent History  L shoulder surgery, chronic back  [facility] [address]  [place], [place], [address] Phone: [phone]   Fax:  [phone]  Physical Therapy Treatment  Patient Details  Name: Gerald [name] MRN: [id number] Date of Birth: 24-Apr-1945 Referring Provider: Karlton Lemon, MD   Progress Note Reporting Period 09/14/17 to 10/14/17  See note below for Objective Data and Assessment of Progress/Goals.    Encounter Date: 10/14/2017  PT End of Session - 10/14/17 1441    Visit Number  6    Number of Visits  13    Date for PT Re-Evaluation  10/26/17    Authorization Type  [employer]    PT Start Time  1349    PT Stop Time  1435    PT Time Calculation (min)  46 min    Activity Tolerance  Patient tolerated treatment well    Behavior During Therapy  WFL for tasks assessed/performed       Past Medical History:  Diagnosis Date  . Back pain, chronic   . Benign localized prostatic hyperplasia with lower urinary tract symptoms (LUTS)   . Depression   . Diverticulosis of colon   . Elevated PSA   . Fatty liver   . GERD (gastroesophageal reflux disease)   . Hiatal hernia   . History of alcohol abuse    quit 1989  . History of esophageal stricture    2003  S/P  DILATATION  . History of esophagitis   . History of melanoma in situ    06/ 2017  REMOVAL RIGHT CHEEK AREA  . History of sepsis 06/06/2016   admitted for urosepsis , UTI due to pseudomonas-- resolved   . Hx of adenomatous colonic polyps    2003   TUBULAR ADENOMA  . Hyperlipidemia   . Hypertension   . Mild sleep apnea    2004 mild, per sleep study-- no cpap recommended  (pt denies)  . Urinary retention     Past Surgical History:  Procedure Laterality Date  . CARDIOVASCULAR STRESS TEST  04-17-2015  dr Claiborne Billings   Normal stress nulcear study w/ a small, mild, fixed inferior defect consistent with inferior thinning; no ischemia; normal LV function and wall motion , stress ef 56%   . COLONOSCOPY  last one 12-02-2006  . EXCISION LEFT ARM MASS  1988  . MOHS SURGERY  09/06/2015   right parotidemomasseteric excision w/ complex repair for melanoma in situ  . THULIUM LASER TURP (TRANSURETHRAL RESECTION OF PROSTATE) N/A 07/29/2016   Procedure: Marcelino Duster LASER TURP (TRANSURETHRAL RESECTION OF PROSTATE)/ TRANSRECTAL ULTRASOUND GUIDED PROSTATE BIOPSY;  Surgeon: Nickie Retort, MD;  Location: [facility];  Service: Urology;  Laterality: N/A;  . TONSILLECTOMY AND ADENOIDECTOMY  child  . TRANSTHORACIC ECHOCARDIOGRAM  04/16/2015   moderate LVH,  ef 80-99%, grade 1 diastolic dysfunction/  trivial AR, MR and PR/  mild dilated ascending aorta/  mild TR  . UPPER GASTROINTESTINAL ENDOSCOPY  last one 02-15-2014    There were no vitals filed for this visit.  Subjective Assessment - 10/14/17 1348    Subjective  Reports he went to the MD yesterda who told him to discontinue PT. Reports 100% improvement in R shoulder since intiial eval. Can do everything he needs to do and can reach up into cabinet. Reports that he dropped his HEP exercises down to yellow band because the resistance was hurting him.     Pertinent History  L shoulder surgery, chronic back  handout with updated exercises. Reported understanding. Patient to be placed on 30 day hold at this time d/t meeting or partially meeting all goals. Patient in agreement.     PT Treatment/Interventions  ADLs/Self Care Home Management;Cryotherapy;Electrical Stimulation;Iontophoresis 63m/ml Dexamethasone;Moist Heat;Ultrasound;Functional mobility training;Therapeutic activities;Therapeutic exercise;Manual techniques;Patient/family education;Scar mobilization;Passive range of motion;Dry needling;Energy conservation;Splinting;Taping;Vasopneumatic Device    PT Next Visit Plan  30 day hold at this time    Consulted and Agree with Plan of Care  Patient       Patient will benefit from skilled therapeutic intervention in order to improve the following deficits and impairments:  Hypomobility, Decreased scar mobility, Pain, Impaired UE functional use, Decreased strength, Decreased activity tolerance,  Improper body mechanics, Decreased range of motion, Impaired flexibility, Postural dysfunction  Visit Diagnosis: Chronic left shoulder pain  Muscle weakness (generalized)  Stiffness of left shoulder, not elsewhere classified     Problem List Patient Active Problem List   Diagnosis Date Noted  . Left shoulder pain 09/04/2017  . BPH (benign prostatic hyperplasia) 09/17/2016  . Melanoma of skin (HRingling 09/26/2015  . Mild obesity 06/05/2015  . Hypertensive left ventricular hypertrophy, without heart failure 06/05/2015  . 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  . HTN (hypertension) 09/01/2011  . Elevated PSA 05/07/2011  . GERD (gastroesophageal reflux disease) 01/14/2011  . Family history of colon cancer 01/14/2011  . History of colon polyps 01/14/2011  . Fatty liver disease, nonalcoholic 181/77/1165 . ESOPHAGEAL STRICTURE 11/27/2008  . COLONIC POLYPS, ADENOMATOUS, HX OF 11/27/2008  . ALCOHOL ABUSE, HX OF 09/16/2007  . DIVERTICULOSIS, COLON 07/16/2007  . BACK PAIN, CHRONIC 07/16/2007  . Hyperlipidemia 08/10/2006    YJanene Harvey PT, DPT 10/14/17 2:50 PM   [facility] [address] [address]Phone: [phone]  Fax:  [phone] Name: Gerald CHAVARRIAMRN: [id number] of Birth: [birth year] PHYSICAL THERAPY DISCHARGE SUMMARY  Visits from Start of Care: 6  Current functional level related to goals / functional outcomes: See above; patient did not return after 30 day hold d/t being pleased with progress   Remaining deficits: See above   Education / Equipment: HEP  Plan: Patient agrees to discharge.  Patient goals were partially met. Patient is being discharged due to being pleased with the current functional level.  ?????     YJanene Harvey PT, DPT 11/17/17 3:37 PM  handout with updated exercises. Reported understanding. Patient to be placed on 30 day hold at this time d/t meeting or partially meeting all goals. Patient in agreement.     PT Treatment/Interventions  ADLs/Self Care Home Management;Cryotherapy;Electrical Stimulation;Iontophoresis 63m/ml Dexamethasone;Moist Heat;Ultrasound;Functional mobility training;Therapeutic activities;Therapeutic exercise;Manual techniques;Patient/family education;Scar mobilization;Passive range of motion;Dry needling;Energy conservation;Splinting;Taping;Vasopneumatic Device    PT Next Visit Plan  30 day hold at this time    Consulted and Agree with Plan of Care  Patient       Patient will benefit from skilled therapeutic intervention in order to improve the following deficits and impairments:  Hypomobility, Decreased scar mobility, Pain, Impaired UE functional use, Decreased strength, Decreased activity tolerance,  Improper body mechanics, Decreased range of motion, Impaired flexibility, Postural dysfunction  Visit Diagnosis: Chronic left shoulder pain  Muscle weakness (generalized)  Stiffness of left shoulder, not elsewhere classified     Problem List Patient Active Problem List   Diagnosis Date Noted  . Left shoulder pain 09/04/2017  . BPH (benign prostatic hyperplasia) 09/17/2016  . Melanoma of skin (HRingling 09/26/2015  . Mild obesity 06/05/2015  . Hypertensive left ventricular hypertrophy, without heart failure 06/05/2015  . 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  . HTN (hypertension) 09/01/2011  . Elevated PSA 05/07/2011  . GERD (gastroesophageal reflux disease) 01/14/2011  . Family history of colon cancer 01/14/2011  . History of colon polyps 01/14/2011  . Fatty liver disease, nonalcoholic 181/77/1165 . ESOPHAGEAL STRICTURE 11/27/2008  . COLONIC POLYPS, ADENOMATOUS, HX OF 11/27/2008  . ALCOHOL ABUSE, HX OF 09/16/2007  . DIVERTICULOSIS, COLON 07/16/2007  . BACK PAIN, CHRONIC 07/16/2007  . Hyperlipidemia 08/10/2006    YJanene Harvey PT, DPT 10/14/17 2:50 PM   [facility] [address] [address]Phone: [phone]  Fax:  [phone] Name: Gerald CHAVARRIAMRN: [id number] of Birth: [birth year] PHYSICAL THERAPY DISCHARGE SUMMARY  Visits from Start of Care: 6  Current functional level related to goals / functional outcomes: See above; patient did not return after 30 day hold d/t being pleased with progress   Remaining deficits: See above   Education / Equipment: HEP  Plan: Patient agrees to discharge.  Patient goals were partially met. Patient is being discharged due to being pleased with the current functional level.  ?????     YJanene Harvey PT, DPT 11/17/17 3:37 PM

## 2017-10-16 ENCOUNTER — Encounter: Payer: Medicare Other | Admitting: Physical Therapy

## 2017-10-21 ENCOUNTER — Encounter: Payer: Medicare Other | Admitting: Physical Therapy

## 2017-10-22 ENCOUNTER — Encounter (HOSPITAL_BASED_OUTPATIENT_CLINIC_OR_DEPARTMENT_OTHER): Payer: Self-pay | Admitting: *Deleted

## 2017-10-22 ENCOUNTER — Other Ambulatory Visit: Payer: Self-pay

## 2017-10-22 NOTE — Progress Notes (Signed)
Spoke w/ pt via phone for pre-op interview.  Npo after mn.  Arrive at 0530.  Need istat and ekg.  Will take metoprolol am dos w/ sips of water.  Reviewed [facility] guidelines, to bring mediations.

## 2017-10-25 NOTE — H&P (Signed)
Urology Preoperative H&P   Chief Complaint: Prostatic urethral stricture and enlarged prostate  History of Present Illness: Gerald [name] is a 72 y.o. male with history of an elevated PSA value (s/p PNBx in 2009 and 2018--benign), BPH (previously treated with finasteride--stopped in Oct. 2018--s/p thulium TUR in 07/2016) and gross hematuria.   Last PSA: 5.44 (07/2017). He has a history of elevated PSA with a negative prostate biopsy in August 2009 for PSA of 4.08. He had a negative PCA3 in February 2014.   Prostate volume on TRUSP- 120 cc.   09/03/17:  Gerald [name] is here today with a 4 -day history of painless hematuria that started about 2 weeks ago. Former 1-1.5 ppd smoker, quit 10 years ago. No prior hx of kidney stones. Takes an 81 mg ASA. Remote hx of hematuria that was never evaluated. From a urinary standpoint, he reports a weak FOS with a variable sensation of emptying. Nocturia x2-3. Denies interval UTIs, dysuria or hematuria. Recently restarted his tamsulosin w/o side effects.   10/08/17:  Gerald [name] is back today for a follow-up cystoscopy and to discuss his CT results (listed below) today, he reports persistent urgency/frequency as well as a weak force of stream. He does feel like he is emptying his bladder adequately. Nocturia x3-4. He denies interval urinary tract infections, dysuria or hematuria.     Past Medical History:  Diagnosis Date  . Back pain, chronic   . Benign localized prostatic hyperplasia with lower urinary tract symptoms (LUTS)   . Bladder outlet obstruction   . Depression   . Diverticulosis of colon   . Elevated PSA   . Fatty liver   . GERD (gastroesophageal reflux disease)   . Hiatal hernia   . History of alcohol abuse    quit 1989  . History of esophageal stricture    2003  S/P  DILATATION  . History of esophagitis   . History of melanoma in situ    06/ 2017  REMOVAL RIGHT CHEEK AREA  . History of sepsis 06/06/2016   admitted for urosepsis , UTI due to  pseudomonas-- resolved   . Hx of adenomatous colonic polyps    2003   TUBULAR ADENOMA  . Hyperlipidemia   . Hypertension   . Mild sleep apnea    2004 mild, per sleep study-- no cpap recommended  (pt denies)    Past Surgical History:  Procedure Laterality Date  . CARDIOVASCULAR STRESS TEST  04-17-2015  dr Claiborne Billings   Normal stress nulcear study w/ a small, mild, fixed inferior defect consistent with inferior thinning; no ischemia; normal LV function and wall motion , stress ef 56%  . COLONOSCOPY  last one 12-02-2006  . EXCISION LEFT ARM MASS  1988  . MOHS SURGERY  09/06/2015   right parotidemomasseteric excision w/ complex repair for melanoma in situ  . THULIUM LASER TURP (TRANSURETHRAL RESECTION OF PROSTATE) N/A 07/29/2016   Procedure: Marcelino Duster LASER TURP (TRANSURETHRAL RESECTION OF PROSTATE)/ TRANSRECTAL ULTRASOUND GUIDED PROSTATE BIOPSY;  Surgeon: Nickie Retort, MD;  Location: [facility];  Service: Urology;  Laterality: N/A;  . TONSILLECTOMY AND ADENOIDECTOMY  child  . TRANSTHORACIC ECHOCARDIOGRAM  04/16/2015   moderate LVH,  ef 40-98%, grade 1 diastolic dysfunction/  trivial AR, MR and PR/  mild dilated ascending aorta/  mild TR  . UPPER GASTROINTESTINAL ENDOSCOPY  last one 02-15-2014    Allergies:  Allergies  Allergen Reactions  . Augmentin [Amoxicillin-Pot Clavulanate] Diarrhea    *Can take plain Amoxicillin* Has  patient had a PCN reaction causing immediate rash, facial/tongue/throat swelling, SOB or lightheadedness with hypotension: No Has patient had a PCN reaction causing severe rash involving mucus membranes or skin necrosis: No Has patient had a PCN reaction that required hospitalization No Has patient had a PCN reaction occurring within the last 10 years: Yes If all of the above answers are "NO", then may proceed with Cephalosporin use.   Marland Kitchen Penicillins Rash    Childhood reaction *Can take Amoxicillin* Has patient had a PCN reaction causing immediate  rash, facial/tongue/throat swelling, SOB or lightheadedness with hypotension: Unknown Has patient had a PCN reaction causing severe rash involving mucus membranes or skin necrosis: Unknown Has patient had a PCN reaction that required hospitalization: Unknown Has patient had a PCN reaction occurring within the last 10 years: Unknown  If all of the above answers are "NO", then may proceed with Cephalosporin use    Family History  Problem Relation Age of Onset  . Coronary artery disease Father   . Colon cancer Father        ? unknown age dx/died at 13  . Heart attack Paternal Grandfather   . Coronary artery disease Maternal Grandmother   . Colon cancer Maternal Grandmother 74  . Cancer Maternal Grandmother        colon  . Melanoma Mother   . Coronary artery disease Paternal Grandmother   . Heart attack Maternal Grandfather   . Hypertension Maternal Aunt   . Prostate cancer Neg Hx   . Stroke Neg Hx   . Esophageal cancer Neg Hx   . Pancreatic cancer Neg Hx   . Rectal cancer Neg Hx   . Stomach cancer Neg Hx     Social History:  reports that he quit smoking about 11 years ago. His smoking use included cigarettes. He has a 60.00 pack-year smoking history. He has never used smokeless tobacco. He reports that he does not drink alcohol or use drugs.  ROS: A complete review of systems was performed.  All systems are negative except for pertinent findings as noted.  Physical Exam:  Vital signs in last 24 hours:   Constitutional:  Alert and oriented, No acute distress Cardiovascular: Regular rate and rhythm, No JVD Respiratory: Normal respiratory effort, Lungs clear bilaterally GI: Abdomen is soft, nontender, nondistended, no abdominal masses GU: No CVA tenderness Lymphatic: No lymphadenopathy Neurologic: Grossly intact, no focal deficits Psychiatric: Normal mood and affect  Laboratory Data:  No results for input(s): WBC, HGB, HCT, PLT in the last 72 hours.  No results for  input(s): NA, K, CL, GLUCOSE, BUN, CALCIUM, CREATININE in the last 72 hours.  Invalid input(s): CO3   No results found for this or any previous visit (from the past 24 hour(s)). No results found for this or any previous visit (from the past 240 hour(s)).  Renal Function: No results for input(s): CREATININE in the last 168 hours. CrCl cannot be calculated (Patient's most recent lab result is older than the maximum 21 days allowed.).  Radiologic Imaging: No results found.  I independently reviewed the above imaging studies.  Assessment and Plan Gerald [name] is a 72 y.o. male with a prostatic urethral stricture, prostatic enlargement and worsening LUTS   -Cystoscopy revealed a prostatic urethral stricture (likely the result from his laser TUR in 2018) that was unable to be bypassed with the flexible cystoscope and is very likely the source of his ongoing LUTS. The patient was able to void into his bladder adequately following  the procedure. Precautions for urinary retention was discussed with the patient. He voices understanding.  -The risks, benefits and alternatives of cystoscopy with urethral dilation and TURP was discussed with the patient. The risks included, but are not limited to, bleeding, urinary tract infection, bladder perforation requiring prolonged catheterization and/or open bladder repair, ureteral injury, ureteral obstruction, urethral stricture disease, new or worsening voiding dysfunction, retrograde ejaculation, MI, CVA, PE and the inherent risks of general anesthesia. We also discussed the need for Foley catheterization for at least 3 days post-op and the likely need for post-op observation in the hospital following the procedure. The patient voices understanding and wishes to proceed.     Ellison Hughs, MD 10/25/2017, 9:57 AM  [facility] Pager: [phone]

## 2017-10-26 DIAGNOSIS — N368 Other specified disorders of urethra: Secondary | ICD-10-CM | POA: Insufficient documentation

## 2017-10-26 HISTORY — DX: Other specified disorders of urethra: N36.8

## 2017-10-27 ENCOUNTER — Ambulatory Visit (HOSPITAL_BASED_OUTPATIENT_CLINIC_OR_DEPARTMENT_OTHER): Payer: Medicare Other | Admitting: Anesthesiology

## 2017-10-27 ENCOUNTER — Encounter (HOSPITAL_BASED_OUTPATIENT_CLINIC_OR_DEPARTMENT_OTHER)
Admission: RE | Disposition: A | Discharge status: Home / Self Care | Payer: Self-pay | Source: Ambulatory Visit | Attending: Urology

## 2017-10-27 ENCOUNTER — Encounter (HOSPITAL_BASED_OUTPATIENT_CLINIC_OR_DEPARTMENT_OTHER): Payer: Self-pay

## 2017-10-27 ENCOUNTER — Observation Stay (HOSPITAL_BASED_OUTPATIENT_CLINIC_OR_DEPARTMENT_OTHER)
Admission: RE | Admit: 2017-10-27 | Discharge: 2017-10-28 | Disposition: A | Discharge status: Home / Self Care | Payer: Medicare Other | Source: Ambulatory Visit | Attending: Urology | Admitting: Urology

## 2017-10-27 DIAGNOSIS — Z87442 Personal history of urinary calculi: Secondary | ICD-10-CM | POA: Diagnosis not present

## 2017-10-27 DIAGNOSIS — Z9889 Other specified postprocedural states: Secondary | ICD-10-CM | POA: Insufficient documentation

## 2017-10-27 DIAGNOSIS — Z880 Allergy status to penicillin: Secondary | ICD-10-CM | POA: Insufficient documentation

## 2017-10-27 DIAGNOSIS — Z85820 Personal history of malignant melanoma of skin: Secondary | ICD-10-CM | POA: Diagnosis not present

## 2017-10-27 DIAGNOSIS — Z87891 Personal history of nicotine dependence: Secondary | ICD-10-CM | POA: Diagnosis not present

## 2017-10-27 DIAGNOSIS — R3915 Urgency of urination: Secondary | ICD-10-CM | POA: Diagnosis not present

## 2017-10-27 DIAGNOSIS — I10 Essential (primary) hypertension: Secondary | ICD-10-CM | POA: Diagnosis not present

## 2017-10-27 DIAGNOSIS — Z8249 Family history of ischemic heart disease and other diseases of the circulatory system: Secondary | ICD-10-CM | POA: Diagnosis not present

## 2017-10-27 DIAGNOSIS — Z86010 Personal history of colonic polyps: Secondary | ICD-10-CM | POA: Diagnosis not present

## 2017-10-27 DIAGNOSIS — R351 Nocturia: Secondary | ICD-10-CM | POA: Diagnosis not present

## 2017-10-27 DIAGNOSIS — N35919 Unspecified urethral stricture, male, unspecified site: Secondary | ICD-10-CM | POA: Diagnosis not present

## 2017-10-27 DIAGNOSIS — N138 Other obstructive and reflux uropathy: Secondary | ICD-10-CM | POA: Diagnosis not present

## 2017-10-27 DIAGNOSIS — N320 Bladder-neck obstruction: Secondary | ICD-10-CM | POA: Diagnosis not present

## 2017-10-27 DIAGNOSIS — Z79899 Other long term (current) drug therapy: Secondary | ICD-10-CM | POA: Insufficient documentation

## 2017-10-27 DIAGNOSIS — Z7982 Long term (current) use of aspirin: Secondary | ICD-10-CM | POA: Insufficient documentation

## 2017-10-27 DIAGNOSIS — Z800 Family history of malignant neoplasm of digestive organs: Secondary | ICD-10-CM | POA: Diagnosis not present

## 2017-10-27 DIAGNOSIS — N400 Enlarged prostate without lower urinary tract symptoms: Secondary | ICD-10-CM | POA: Diagnosis not present

## 2017-10-27 DIAGNOSIS — N401 Enlarged prostate with lower urinary tract symptoms: Principal | ICD-10-CM | POA: Insufficient documentation

## 2017-10-27 DIAGNOSIS — N4289 Other specified disorders of prostate: Secondary | ICD-10-CM | POA: Diagnosis not present

## 2017-10-27 HISTORY — PX: CYSTOSCOPY WITH URETHRAL DILATATION: SHX5125

## 2017-10-27 HISTORY — DX: Bladder-neck obstruction: N32.0

## 2017-10-27 HISTORY — DX: Other specified disorders of urethra: N36.8

## 2017-10-27 HISTORY — PX: TRANSURETHRAL RESECTION OF PROSTATE: SHX73

## 2017-10-27 LAB — POCT I-STAT 4, (NA,K, GLUC, HGB,HCT)
Glucose, Bld: 88 mg/dL (ref 70–99)
HEMATOCRIT: 44 % (ref 39.0–52.0)
HEMOGLOBIN: 15 g/dL (ref 13.0–17.0)
POTASSIUM: 3.9 mmol/L (ref 3.5–5.1)
Sodium: 142 mmol/L (ref 135–145)

## 2017-10-27 SURGERY — CYSTOSCOPY, WITH URETHRAL DILATION
Anesthesia: General

## 2017-10-27 SURGERY — Surgical Case
Anesthesia: *Unknown

## 2017-10-27 MED ORDER — HYDROCODONE-ACETAMINOPHEN 5-325 MG PO TABS
ORAL_TABLET | ORAL | Status: AC
  Filled 2017-10-27: qty 1

## 2017-10-27 MED ORDER — DIPHENHYDRAMINE HCL 50 MG/ML IJ SOLN
12.5000 mg | Freq: Four times a day (QID) | INTRAMUSCULAR | Status: DC | PRN
  Filled 2017-10-27: qty 0.25

## 2017-10-27 MED ORDER — CIPROFLOXACIN IN D5W 400 MG/200ML IV SOLN
400.0000 mg | Freq: Once | INTRAVENOUS | Status: AC
  Administered 2017-10-27: 400 mg via INTRAVENOUS
  Filled 2017-10-27: qty 200

## 2017-10-27 MED ORDER — BELLADONNA ALKALOIDS-OPIUM 16.2-60 MG RE SUPP
1.0000 | Freq: Four times a day (QID) | RECTAL | Status: DC | PRN
  Filled 2017-10-27: qty 1

## 2017-10-27 MED ORDER — HYDROCODONE-ACETAMINOPHEN 5-325 MG PO TABS
1.0000 | ORAL_TABLET | ORAL | Status: DC | PRN
  Administered 2017-10-27 – 2017-10-28 (×5): 1 via ORAL
  Filled 2017-10-27: qty 2

## 2017-10-27 MED ORDER — ONDANSETRON HCL 4 MG PO TABS: 4.0000 mg | ORAL_TABLET | Freq: Every day | ORAL | 1 refills | Status: DC | PRN

## 2017-10-27 MED ORDER — ONDANSETRON HCL 4 MG/2ML IJ SOLN
4.0000 mg | INTRAMUSCULAR | Status: DC | PRN
  Filled 2017-10-27: qty 2

## 2017-10-27 MED ORDER — FENTANYL CITRATE (PF) 100 MCG/2ML IJ SOLN
INTRAMUSCULAR | Status: AC
  Filled 2017-10-27: qty 2

## 2017-10-27 MED ORDER — LIDOCAINE 2% (20 MG/ML) 5 ML SYRINGE
INTRAMUSCULAR | Status: DC | PRN
  Administered 2017-10-27: 60 mg via INTRAVENOUS

## 2017-10-27 MED ORDER — ACETAMINOPHEN 325 MG PO TABS
650.0000 mg | ORAL_TABLET | ORAL | Status: DC | PRN
  Filled 2017-10-27: qty 2

## 2017-10-27 MED ORDER — OXYBUTYNIN CHLORIDE 5 MG PO TABS
5.0000 mg | ORAL_TABLET | Freq: Three times a day (TID) | ORAL | Status: DC | PRN
  Filled 2017-10-27: qty 1

## 2017-10-27 MED ORDER — OXYCODONE HCL 5 MG PO TABS
5.0000 mg | ORAL_TABLET | Freq: Once | ORAL | Status: DC | PRN
  Filled 2017-10-27: qty 1

## 2017-10-27 MED ORDER — PROPOFOL 10 MG/ML IV BOLUS
INTRAVENOUS | Status: DC | PRN
  Administered 2017-10-27: 150 mg via INTRAVENOUS

## 2017-10-27 MED ORDER — PHENAZOPYRIDINE HCL 200 MG PO TABS: 200.0000 mg | ORAL_TABLET | Freq: Three times a day (TID) | ORAL | 0 refills | Status: DC | PRN

## 2017-10-27 MED ORDER — FENTANYL CITRATE (PF) 100 MCG/2ML IJ SOLN
25.0000 ug | INTRAMUSCULAR | Status: DC | PRN
  Filled 2017-10-27: qty 1

## 2017-10-27 MED ORDER — FENTANYL CITRATE (PF) 100 MCG/2ML IJ SOLN
INTRAMUSCULAR | Status: DC | PRN
  Administered 2017-10-27 (×3): 50 ug via INTRAVENOUS

## 2017-10-27 MED ORDER — STERILE WATER FOR IRRIGATION IR SOLN
Status: DC | PRN
  Administered 2017-10-27 (×8): 3000 mL

## 2017-10-27 MED ORDER — METOPROLOL TARTRATE 25 MG PO TABS
25.0000 mg | ORAL_TABLET | Freq: Two times a day (BID) | ORAL | Status: DC
  Administered 2017-10-27 – 2017-10-28 (×2): 25 mg via ORAL
  Filled 2017-10-27: qty 1

## 2017-10-27 MED ORDER — PROMETHAZINE HCL 25 MG/ML IJ SOLN
6.2500 mg | INTRAMUSCULAR | Status: DC | PRN
  Filled 2017-10-27: qty 1

## 2017-10-27 MED ORDER — LACTATED RINGERS IV SOLN
INTRAVENOUS | Status: DC
  Administered 2017-10-27: 08:00:00 via INTRAVENOUS
  Filled 2017-10-27: qty 1000

## 2017-10-27 MED ORDER — LOSARTAN POTASSIUM 50 MG PO TABS
100.0000 mg | ORAL_TABLET | Freq: Every day | ORAL | Status: DC
  Administered 2017-10-27 – 2017-10-28 (×2): 100 mg via ORAL
  Filled 2017-10-27: qty 2

## 2017-10-27 MED ORDER — DEXAMETHASONE SODIUM PHOSPHATE 10 MG/ML IJ SOLN
INTRAMUSCULAR | Status: DC | PRN
  Administered 2017-10-27: 10 mg via INTRAVENOUS

## 2017-10-27 MED ORDER — ONDANSETRON HCL 4 MG/2ML IJ SOLN
INTRAMUSCULAR | Status: DC | PRN
  Administered 2017-10-27: 4 mg via INTRAVENOUS

## 2017-10-27 MED ORDER — CIPROFLOXACIN HCL 500 MG PO TABS: 500.0000 mg | ORAL_TABLET | Freq: Two times a day (BID) | ORAL | 0 refills | Status: AC

## 2017-10-27 MED ORDER — DIPHENHYDRAMINE HCL 12.5 MG/5ML PO ELIX
12.5000 mg | ORAL_SOLUTION | Freq: Four times a day (QID) | ORAL | Status: DC | PRN
  Filled 2017-10-27: qty 5

## 2017-10-27 MED ORDER — IOHEXOL 350 MG/ML SOLN
INTRAVENOUS | Status: DC | PRN
  Administered 2017-10-27: 20 mL via URETHRAL

## 2017-10-27 MED ORDER — TRAMADOL HCL 50 MG PO TABS: 50.0000 mg | ORAL_TABLET | Freq: Four times a day (QID) | ORAL | 0 refills | Status: AC | PRN

## 2017-10-27 MED ORDER — KETOROLAC TROMETHAMINE 30 MG/ML IJ SOLN
30.0000 mg | Freq: Once | INTRAMUSCULAR | Status: DC | PRN
  Filled 2017-10-27: qty 1

## 2017-10-27 MED ORDER — CIPROFLOXACIN HCL 500 MG PO TABS
500.0000 mg | ORAL_TABLET | Freq: Two times a day (BID) | ORAL | Status: DC
  Administered 2017-10-27 – 2017-10-28 (×2): 500 mg via ORAL
  Filled 2017-10-27 (×3): qty 1

## 2017-10-27 MED ORDER — OXYBUTYNIN CHLORIDE 5 MG PO TABS: 5.0000 mg | ORAL_TABLET | Freq: Three times a day (TID) | ORAL | 1 refills | Status: DC | PRN

## 2017-10-27 MED ORDER — MORPHINE SULFATE (PF) 2 MG/ML IV SOLN
2.0000 mg | INTRAVENOUS | Status: DC | PRN
  Filled 2017-10-27: qty 2

## 2017-10-27 MED ORDER — ZOLPIDEM TARTRATE 5 MG PO TABS
5.0000 mg | ORAL_TABLET | Freq: Once | ORAL | Status: AC
  Administered 2017-10-27: 5 mg via ORAL
  Filled 2017-10-27: qty 1

## 2017-10-27 MED ORDER — SODIUM CHLORIDE 0.9 % IR SOLN
3000.0000 mL | Status: DC
  Filled 2017-10-27: qty 3000

## 2017-10-27 MED ORDER — CIPROFLOXACIN IN D5W 400 MG/200ML IV SOLN
INTRAVENOUS | Status: AC
  Filled 2017-10-27: qty 200

## 2017-10-27 MED ORDER — PROPOFOL 10 MG/ML IV BOLUS
INTRAVENOUS | Status: AC
  Filled 2017-10-27: qty 20

## 2017-10-27 MED ORDER — SODIUM CHLORIDE 0.9 % IV SOLN
INTRAVENOUS | Status: DC
  Administered 2017-10-27: 22:00:00 via INTRAVENOUS
  Filled 2017-10-27: qty 1000

## 2017-10-27 MED ORDER — ZOLPIDEM TARTRATE 5 MG PO TABS
ORAL_TABLET | ORAL | Status: AC
Start: 2017-10-27 — End: ?
  Filled 2017-10-27: qty 1

## 2017-10-27 MED ORDER — EPHEDRINE SULFATE-NACL 50-0.9 MG/10ML-% IV SOSY
PREFILLED_SYRINGE | INTRAVENOUS | Status: DC | PRN
  Administered 2017-10-27 (×3): 5 mg via INTRAVENOUS

## 2017-10-27 MED ORDER — OXYCODONE HCL 5 MG/5ML PO SOLN
5.0000 mg | Freq: Once | ORAL | Status: DC | PRN
  Filled 2017-10-27: qty 5

## 2017-10-27 SURGICAL SUPPLY — 30 items
BAG DRAIN URO-CYSTO SKYTR STRL (DRAIN) ×2 IMPLANT
BAG DRN UROCATH (DRAIN) ×1
BAG URINE DRAINAGE (UROLOGICAL SUPPLIES) ×2 IMPLANT
BALLN NEPHROSTOMY (BALLOONS)
BALLOON NEPHROSTOMY (BALLOONS) IMPLANT
CATH FOLEY 3WAY 30CC 24FR (CATHETERS) ×2
CATH ROBINSON RED A/P 14FR (CATHETERS) IMPLANT
CATH URET 5FR 28IN OPEN ENDED (CATHETERS) ×1 IMPLANT
CATH URTH STD 24FR FL 3W 2 (CATHETERS) IMPLANT
CLOTH BEACON ORANGE TIMEOUT ST (SAFETY) ×2 IMPLANT
ELECT REM PT RETURN 9FT ADLT (ELECTROSURGICAL) ×2
ELECTRODE REM PT RTRN 9FT ADLT (ELECTROSURGICAL) ×1 IMPLANT
GLOVE BIO SURGEON STRL SZ7.5 (GLOVE) ×2 IMPLANT
GOWN STRL REUS W/ TWL XL LVL3 (GOWN DISPOSABLE) ×3 IMPLANT
GOWN STRL REUS W/TWL XL LVL3 (GOWN DISPOSABLE) ×8 IMPLANT
GUIDEWIRE STR DUAL SENSOR (WIRE) IMPLANT
HOLDER FOLEY CATH W/STRAP (MISCELLANEOUS) ×1 IMPLANT
KIT TURNOVER CYSTO (KITS) ×2 IMPLANT
LOOP CUT BIPOLAR 24F LRG (ELECTROSURGICAL) ×1 IMPLANT
MANIFOLD NEPTUNE II (INSTRUMENTS) ×2 IMPLANT
NDL HYPO 18GX1.5 BLUNT FILL (NEEDLE) IMPLANT
NEEDLE HYPO 18GX1.5 BLUNT FILL (NEEDLE) IMPLANT
PACK CYSTO (CUSTOM PROCEDURE TRAY) ×2 IMPLANT
PIN SAFETY STERILE (MISCELLANEOUS) ×2 IMPLANT
RUBBERBAND STERILE (MISCELLANEOUS) ×1 IMPLANT
SYR 30ML LL (SYRINGE) ×1 IMPLANT
SYRINGE IRR TOOMEY STRL 70CC (SYRINGE) ×2 IMPLANT
TUBE CONNECTING 12X1/4 (SUCTIONS) ×2 IMPLANT
TUBING UROLOGY SET (TUBING) ×1 IMPLANT
WATER STERILE IRR 3000ML UROMA (IV SOLUTION) ×2 IMPLANT

## 2017-10-27 NOTE — Anesthesia Procedure Notes (Signed)
Date/Time: 10/27/2017 8:48 AM Performed by: Cynda Familia, CRNA Oxygen Delivery Method: Simple face mask Placement Confirmation: positive ETCO2 and breath sounds checked- equal and bilateral Dental Injury: Teeth and Oropharynx as per pre-operative assessment

## 2017-10-27 NOTE — Transfer of Care (Signed)
Immediate Anesthesia Transfer of Care Note  Patient: Gerald [name]  Procedure(s) Performed: CYSTOSCOPY WITH URETHRAL DILATATION (N/A ) TRANSURETHRAL RESECTION OF THE PROSTATE (TURP) (N/A )  Patient Location: PACU  Anesthesia Type:General  Level of Consciousness: sedated  Airway & Oxygen Therapy: Patient Spontanous Breathing and Patient connected to face mask oxygen  Post-op Assessment: Report given to RN and Post -op Vital signs reviewed and stable  Post vital signs: Reviewed and stable  Last Vitals:  Vitals Value Taken Time  BP    Temp    Pulse 73 10/27/2017  8:56 AM  Resp 13 10/27/2017  8:56 AM  SpO2 95 % 10/27/2017  8:56 AM  Vitals shown include unvalidated device data.  Last Pain:  Vitals:   10/27/17 0616  TempSrc:   PainSc: 0-No pain      Patients Stated Pain Goal: 5 (56/25/63 8937)  Complications: No apparent anesthesia complications

## 2017-10-27 NOTE — Op Note (Signed)
Operative Note  Preoperative diagnosis:  1.  BPH with bladder outlet obstruction  2.  Prostatic urethral stricture  Postoperative diagnosis: 1.  BPH with bladder outlet obstruction  2.  Prostatic urethral stricture  Procedure(s): 1.  Cystoscopy 2.  Cystogram with intraoperative interpretation of fluoroscopic imaging 3.  Dilation of urethral stricture 4.  Bipolar TURP  Surgeon: Ellison Hughs, MD  Assistants: None  Anesthesia: General  Complications: None  EBL: 150 mL  Specimens: 1.  Prostate chips  Drains/Catheters: 1.  24 French three-way Foley catheter with 30 mL in the balloon  Intraoperative findings:   1. A prostatic urethral stricture was identified with a 5 [place] aperture.  A wire was advanced through the aperture and into the bladder.  A cystogram was obtained that showed clear and smooth outlining of the inside of the bladder with no obvious filling defects.  The 23 French rigid cystoscope was then advanced through the area of stricture with no other intravesical pathology identified.  Indication:  Gerald [name] is a 72 y.o. male with a history of BPH status post thulium TUR in May 2018.  He presented to the office with worsening lower urinary tract symptoms.  He had a cystoscopy that revealed a prostatic urethral stricture as well as prostatic regrowth.  He has been consented for the above procedures, voices understanding and wishes to proceed.  Description of procedure:  After informed consent was obtained, the patient was brought to the operating room and general LMA anesthesia was administered. The patient was then placed in the dorsolithotomy position and prepped and draped in usual sterile fashion. A timeout was performed. A 23 French rigid cystoscope was then inserted into the urethral meatus and advanced into the prostatic urethra where his stricture was identified.  A sensor wire was then advanced through the 5 French temperature and into the bladder,  confirming placement by fluoroscopy.  A 5 French open-ended catheter was then advanced over the wire and a cystogram was obtained, with the findings listed above.  The aperture appeared dilated following wire placement and the 23 [place] scope was easily advanced through it, over the wire.  A complete bladder survey revealed no intravesical pathology.  Both ureteral orifices were identified and well away from the bladder neck and area of resection.  The rigid cystoscope was then exchanged for a 26 French resectoscope with a bipolar loop working element.  Starting at the bladder neck and progressing distally to the verumontanum, the prostatic adenoma was systematically resected until a widely patent prostatic urethral channel was created.  All prostate chips were then hand irrigated out of the bladder and sent to pathology for permanent section.  The resectoscope was then removed and exchanged for a 24 [place] three-way Foley catheter.  The three-way Foley catheter was then extensively hand irrigated until the irrigant returned clear to light pink.  The catheter was then placed to continuous bladder irrigation and placed on rubber band traction.  He tolerated the procedure well and was transferred to the postanesthesia unit in stable condition.  Plan:  CBI and traction overnight

## 2017-10-27 NOTE — Anesthesia Procedure Notes (Signed)
Procedure Name: LMA Insertion Date/Time: 10/27/2017 7:42 AM Performed by: Cynda Familia, CRNA Pre-anesthesia Checklist: Patient identified, Emergency Drugs available, Suction available and Patient being monitored Patient Re-evaluated:Patient Re-evaluated prior to induction Oxygen Delivery Method: Circle System Utilized Preoxygenation: Pre-oxygenation with 100% oxygen Induction Type: IV induction Ventilation: Mask ventilation without difficulty LMA: LMA inserted LMA Size: 4.0 Number of attempts: 1 Placement Confirmation: positive ETCO2 Tube secured with: Tape Dental Injury: Teeth and Oropharynx as per pre-operative assessment  Comments: Smooth IV induction Rose-- LMA insertion AM CRNA atraumatic-- tooth chipped left front prior too LMA insertion-- unchanged after LMA insertion-- Rose--- bilat BS

## 2017-10-27 NOTE — Anesthesia Postprocedure Evaluation (Signed)
Anesthesia Post Note  Patient: Gerald [name]  Procedure(s) Performed: CYSTOSCOPY WITH URETHRAL DILATATION (N/A ) TRANSURETHRAL RESECTION OF THE PROSTATE (TURP) (N/A )     Patient location during evaluation: PACU Anesthesia Type: General Level of consciousness: awake and alert Pain management: pain level controlled Vital Signs Assessment: post-procedure vital signs reviewed and stable Respiratory status: spontaneous breathing, nonlabored ventilation, respiratory function stable and patient connected to nasal cannula oxygen Cardiovascular status: blood pressure returned to baseline and stable Postop Assessment: no apparent nausea or vomiting Anesthetic complications: no    Last Vitals:  Vitals:   10/27/17 0549 10/27/17 0856  BP: 139/76 (!) (P) 147/95  Pulse: (!) 48   Resp: 16   Temp: 37 C (!) (P) 36.3 C  SpO2: 98%     Last Pain:  Vitals:   10/27/17 0616  TempSrc:   PainSc: 0-No pain                 Tanmay Halteman S

## 2017-10-27 NOTE — Anesthesia Preprocedure Evaluation (Addendum)
Anesthesia Evaluation  Patient identified by MRN, date of birth, ID band Patient awake    Reviewed: Allergy & Precautions, NPO status , Patient's Chart, lab work & pertinent test results  Airway Mallampati: II  TM Distance: >3 FB Neck ROM: Full    Dental no notable dental hx. (+) Dental Advisory Given, Chipped   Pulmonary neg pulmonary ROS, former smoker,    Pulmonary exam normal breath sounds clear to auscultation       Cardiovascular hypertension, Normal cardiovascular exam Rhythm:Regular Rate:Normal     Neuro/Psych negative neurological ROS  negative psych ROS   GI/Hepatic Neg liver ROS, GERD  ,  Endo/Other  negative endocrine ROS  Renal/GU negative Renal ROS  negative genitourinary   Musculoskeletal negative musculoskeletal ROS (+)   Abdominal   Peds negative pediatric ROS (+)  Hematology negative hematology ROS (+)   Anesthesia Other Findings   Reproductive/Obstetrics negative OB ROS                            Anesthesia Physical Anesthesia Plan  ASA: II  Anesthesia Plan: General   Post-op Pain Management:    Induction: Intravenous  PONV Risk Score and Plan: 2 and Ondansetron, Dexamethasone and Treatment may vary due to age or medical condition  Airway Management Planned: LMA  Additional Equipment:   Intra-op Plan:   Post-operative Plan: Extubation in OR  Informed Consent: I have reviewed the patients History and Physical, chart, labs and discussed the procedure including the risks, benefits and alternatives for the proposed anesthesia with the patient or authorized representative who has indicated his/her understanding and acceptance.   Dental advisory given  Plan Discussed with: CRNA and Surgeon  Anesthesia Plan Comments:         Anesthesia Quick Evaluation

## 2017-10-28 DIAGNOSIS — N35919 Unspecified urethral stricture, male, unspecified site: Secondary | ICD-10-CM | POA: Diagnosis not present

## 2017-10-28 DIAGNOSIS — R3915 Urgency of urination: Secondary | ICD-10-CM | POA: Diagnosis not present

## 2017-10-28 DIAGNOSIS — N401 Enlarged prostate with lower urinary tract symptoms: Secondary | ICD-10-CM | POA: Diagnosis not present

## 2017-10-28 DIAGNOSIS — N138 Other obstructive and reflux uropathy: Secondary | ICD-10-CM | POA: Diagnosis not present

## 2017-10-28 DIAGNOSIS — R351 Nocturia: Secondary | ICD-10-CM | POA: Diagnosis not present

## 2017-10-28 DIAGNOSIS — N320 Bladder-neck obstruction: Secondary | ICD-10-CM | POA: Diagnosis not present

## 2017-10-28 LAB — BASIC METABOLIC PANEL
Anion gap: 7 (ref 5–15)
BUN: 19 mg/dL (ref 8–23)
CHLORIDE: 107 mmol/L (ref 98–111)
CO2: 27 mmol/L (ref 22–32)
CREATININE: 0.79 mg/dL (ref 0.61–1.24)
Calcium: 8.8 mg/dL — ABNORMAL LOW (ref 8.9–10.3)
GFR calc Af Amer: >60 mL/min (ref >60)
GFR calc non Af Amer: >60 mL/min (ref >60)
GLUCOSE: 115 mg/dL — AB (ref 70–99)
Potassium: 4.2 mmol/L (ref 3.5–5.1)
Sodium: 141 mmol/L (ref 135–145)

## 2017-10-28 LAB — HEMOGLOBIN AND HEMATOCRIT, BLOOD
HCT: 41 % (ref 39.0–52.0)
Hemoglobin: 14 g/dL (ref 13.0–17.0)

## 2017-10-28 MED ORDER — HYDROCODONE-ACETAMINOPHEN 5-325 MG PO TABS
ORAL_TABLET | ORAL | Status: AC
  Filled 2017-10-28: qty 1

## 2017-10-28 MED ORDER — BACITRACIN-NEOMYCIN-POLYMYXIN OINTMENT TUBE
TOPICAL_OINTMENT | CUTANEOUS | Status: DC | PRN
  Administered 2017-10-28: 09:00:00 via TOPICAL
  Filled 2017-10-28: qty 14.17

## 2017-10-28 MED ORDER — BACITRACIN-NEOMYCIN-POLYMYXIN 400-5-5000 EX OINT
TOPICAL_OINTMENT | CUTANEOUS | Status: AC
  Filled 2017-10-28: qty 1

## 2017-10-28 MED ORDER — HYDROCODONE-ACETAMINOPHEN 5-325 MG PO TABS
ORAL_TABLET | ORAL | Status: AC
Start: 2017-10-28 — End: ?
  Filled 2017-10-28: qty 1

## 2017-10-29 ENCOUNTER — Telehealth: Payer: Self-pay

## 2017-10-29 NOTE — Telephone Encounter (Signed)
No thanks, urology will follow up with him. Thanks.

## 2017-10-29 NOTE — Telephone Encounter (Signed)
Melissa could you please review this patients Observation notes and let me know if you want to see him for a hospital follow up.

## 2017-10-29 NOTE — Telephone Encounter (Signed)
Noted  

## 2017-10-30 ENCOUNTER — Encounter (HOSPITAL_BASED_OUTPATIENT_CLINIC_OR_DEPARTMENT_OTHER): Payer: Self-pay | Admitting: Urology

## 2017-11-04 NOTE — Discharge Summary (Signed)
Date of admission: 10/27/2017  Date of discharge: 10/28/17  Admission diagnosis: BPH with bladder outlet obstruction  Discharge diagnosis: Same   History and Physical: For full details, please see admission history and physical. Briefly, Gerald [name] is a 72 y.o. year old patient with worsening BPH with bladder outlet obstruction.   Hospital Course: The patient underwent TURP on 10/27/17 and had a routine post-op course.  He as monitored on the floor.  His urine was clear w/o CBI on POD1.  He was tolerating a regular diet and ambulating w/o difficulty.   Physical Exam:  General: Alert and oriented CV: RRR, palpable distal pulses Lungs: CTAB, equal chest rise Abdomen: Soft, NTND, no rebound or guarding GU: Foley draining clear urine  Ext: NT, No erythema  Laboratory values: No results for input(s): HGB, HCT in the last 72 hours. No results for input(s): CREATININE in the last 72 hours.  Disposition: Home  Discharge instruction: The patient was instructed to be ambulatory but told to refrain from heavy lifting, strenuous activity, or driving.  Discharge medications:  Allergies as of 10/28/2017      Reactions   Augmentin [amoxicillin-pot Clavulanate] Diarrhea   *Can take plain Amoxicillin* Has patient had a PCN reaction causing immediate rash, facial/tongue/throat swelling, SOB or lightheadedness with hypotension: No Has patient had a PCN reaction causing severe rash involving mucus membranes or skin necrosis: No Has patient had a PCN reaction that required hospitalization No Has patient had a PCN reaction occurring within the last 10 years: Yes If all of the above answers are "NO", then may proceed with Cephalosporin use.   Penicillins Rash   Childhood reaction *Can take Amoxicillin* Has patient had a PCN reaction causing immediate rash, facial/tongue/throat swelling, SOB or lightheadedness with hypotension: Unknown Has patient had a PCN reaction causing severe rash involving  mucus membranes or skin necrosis: Unknown Has patient had a PCN reaction that required hospitalization: Unknown Has patient had a PCN reaction occurring within the last 10 years: Unknown  If all of the above answers are "NO", then may proceed with Cephalosporin use      Medication List    TAKE these medications   aspirin EC 81 MG tablet Take 81 mg by mouth every other day.   Ciclopirox 1 % shampoo APP TO SCALP UTD AND PRN   Fish Oil 1200 MG Caps Take 2 capsules (2,400 mg total) by mouth 2 (two) times daily. What changed:  when to take this   ketoconazole 2 % shampoo Commonly known as:  NIZORAL Apply 1 application topically as needed for irritation. For eczema   losartan 100 MG tablet Commonly known as:  COZAAR TAKE 1 TABLET(100 MG) BY MOUTH DAILY What changed:  See the new instructions.   Magnesium Malate 1250 (141.7 Mg) MG Tabs Take 1 tablet by mouth 3 (three) times a week.   meclizine 25 MG tablet Commonly known as:  ANTIVERT Take 25 mg by mouth 3 (three) times daily as needed for dizziness.   metoprolol tartrate 25 MG tablet Commonly known as:  LOPRESSOR TAKE 1 TABLET BY MOUTH TWICE DAILY   MULTIVITAMIN ADULTS 50+ PO Take 1 tablet by mouth daily.   ondansetron 4 MG tablet Commonly known as:  ZOFRAN Take 1 tablet (4 mg total) by mouth daily as needed for nausea or vomiting.   oxybutynin 5 MG tablet Commonly known as:  DITROPAN Take 1 tablet (5 mg total) by mouth every 8 (eight) hours as needed for bladder spasms.  phenazopyridine 200 MG tablet Commonly known as:  PYRIDIUM Take 1 tablet (200 mg total) by mouth 3 (three) times daily as needed (for pain with urination).   REFRESH DRY EYE THERAPY OP Apply to eye as needed.     ASK your doctor about these medications   ciprofloxacin 500 MG tablet Commonly known as:  CIPRO Take 1 tablet (500 mg total) by mouth 2 (two) times daily for 3 days. Ask about: Should I take this medication?   traMADol 50 MG  tablet Commonly known as:  ULTRAM Take 1 tablet (50 mg total) by mouth every 6 (six) hours as needed for up to 3 days. Ask about: Should I take this medication?       Followup: 5 days for catheter removal

## 2017-11-16 DIAGNOSIS — R351 Nocturia: Secondary | ICD-10-CM | POA: Diagnosis not present

## 2017-11-16 DIAGNOSIS — N401 Enlarged prostate with lower urinary tract symptoms: Secondary | ICD-10-CM | POA: Diagnosis not present

## 2017-12-21 ENCOUNTER — Other Ambulatory Visit: Payer: Self-pay | Admitting: Family

## 2017-12-22 ENCOUNTER — Encounter: Payer: Self-pay | Admitting: Family

## 2017-12-22 MED ORDER — METOPROLOL TARTRATE 25 MG PO TABS: 25.0000 mg | ORAL_TABLET | Freq: Two times a day (BID) | ORAL | 0 refills | Status: DC

## 2017-12-22 MED ORDER — LOSARTAN POTASSIUM 100 MG PO TABS: ORAL_TABLET | ORAL | 0 refills | Status: DC

## 2018-01-07 DIAGNOSIS — H53483 Generalized contraction of visual field, bilateral: Secondary | ICD-10-CM | POA: Diagnosis not present

## 2018-01-07 DIAGNOSIS — H5361 Abnormal dark adaptation curve: Secondary | ICD-10-CM | POA: Diagnosis not present

## 2018-01-07 DIAGNOSIS — H2513 Age-related nuclear cataract, bilateral: Secondary | ICD-10-CM | POA: Diagnosis not present

## 2018-01-19 DIAGNOSIS — Z23 Encounter for immunization: Secondary | ICD-10-CM | POA: Diagnosis not present

## 2018-03-01 ENCOUNTER — Ambulatory Visit (INDEPENDENT_AMBULATORY_CARE_PROVIDER_SITE_OTHER): Payer: Medicare Other | Admitting: Family

## 2018-03-01 ENCOUNTER — Telehealth: Payer: Self-pay | Admitting: Family

## 2018-03-01 ENCOUNTER — Encounter: Payer: Self-pay | Admitting: Family

## 2018-03-01 VITALS — BP 145/79 | HR 57 | Temp 98.0°F | Resp 16 | Ht 66.0 in | Wt 181.0 lb

## 2018-03-01 DIAGNOSIS — R319 Hematuria, unspecified: Secondary | ICD-10-CM

## 2018-03-01 DIAGNOSIS — I10 Essential (primary) hypertension: Secondary | ICD-10-CM | POA: Diagnosis not present

## 2018-03-01 NOTE — Progress Notes (Signed)
Subjective:    Patient ID: Gerald [name], male    DOB: May 08, 1945, 72 y.o.   MRN: [id number]  HPI  Patient is a 72 year old male who presents today for routine follow-up.  HTN- maintained on losartan 100mg  and metoprolol.  Denies cp/sob/swelling.   BP Readings from Last 3 Encounters:  03/01/18 (!) 145/79  10/28/17 135/65  10/13/17 (!) 144/72   Hematuria-  Had cystoscopy 7/19.  Reportedly negative per patient. He has a follow up in January.     Review of Systems   See HPI  Past Medical History:  Diagnosis Date  . Back pain, chronic   . Benign localized prostatic hyperplasia with lower urinary tract symptoms (LUTS)   . Bladder outlet obstruction   . Bleeding from urethra in male 10/26/2017   3 times over 7 hours  . Depression   . Diverticulosis of colon   . Elevated PSA   . Fatty liver   . GERD (gastroesophageal reflux disease)   . Hiatal hernia   . History of alcohol abuse    quit 1989  . History of esophageal stricture    2003  S/P  DILATATION  . History of esophagitis   . History of melanoma in situ    06/ 2017  REMOVAL RIGHT CHEEK AREA  . History of sepsis 06/06/2016   admitted for urosepsis , UTI due to pseudomonas-- resolved   . Hx of adenomatous colonic polyps    2003   TUBULAR ADENOMA  . Hyperlipidemia   . Hypertension   . Mild sleep apnea    2004 mild, per sleep study-- no cpap recommended  (pt denies)     Social History   Socioeconomic History  . Marital status: Married    Spouse name: Not on file  . Number of children: 2  . Years of education: Not on file  . Highest education level: Not on file  Occupational History  . Occupation: Retired  Engineer, production  . Financial resource strain: Not on file  . Food insecurity:    Worry: Not on file    Inability: Not on file  . Transportation needs:    Medical: Not on file    Non-medical: Not on file  Tobacco Use  . Smoking status: Former Smoker    Packs/day: 2.00    Years: 30.00    Pack  years: 60.00    Types: Cigarettes    Last attempt to quit: 08/29/2006    Years since quitting: 11.5  . Smokeless tobacco: Never Used  Substance and Sexual Activity  . Alcohol use: No  . Drug use: No  . Sexual activity: Yes  Lifestyle  . Physical activity:    Days per week: Not on file    Minutes per session: Not on file  . Stress: Not on file  Relationships  . Social connections:    Talks on phone: Not on file    Gets together: Not on file    Attends religious service: Not on file    Active member of club or organization: Not on file    Attends meetings of clubs or organizations: Not on file    Relationship status: Not on file  . Intimate partner violence:    Fear of current or ex partner: Not on file    Emotionally abused: Not on file    Physically abused: Not on file    Forced sexual activity: Not on file  Other Topics Concern  . Not on file  Social History Narrative   Diet: regular, lots of vegetables-----exercise : none but active   Caffeine use:  5 cups coffee daily          Past Surgical History:  Procedure Laterality Date  . CARDIOVASCULAR STRESS TEST  04-17-2015  dr Tresa Endo   Normal stress nulcear study w/ a small, mild, fixed inferior defect consistent with inferior thinning; no ischemia; normal LV function and wall motion , stress ef 56%  . COLONOSCOPY  last one 12-02-2006  . CYSTOSCOPY WITH URETHRAL DILATATION N/A 10/27/2017   Procedure: CYSTOSCOPY WITH URETHRAL DILATATION;  Surgeon: Rene Paci, MD;  Location: [facility];  Service: Urology;  Laterality: N/A;  ONLY NEEDS 90 MIN FOR ALL PROCEDURES  . EXCISION LEFT ARM MASS  1988  . MOHS SURGERY  09/06/2015   right parotidemomasseteric excision w/ complex repair for melanoma in situ  . THULIUM LASER TURP (TRANSURETHRAL RESECTION OF PROSTATE) N/A 07/29/2016   Procedure: Morton Peters LASER TURP (TRANSURETHRAL RESECTION OF PROSTATE)/ TRANSRECTAL ULTRASOUND GUIDED PROSTATE BIOPSY;  Surgeon: Hildred Laser, MD;  Location: [facility];  Service: Urology;  Laterality: N/A;  . TONSILLECTOMY AND ADENOIDECTOMY  child  . TRANSTHORACIC ECHOCARDIOGRAM  04/16/2015   moderate LVH,  ef 60-65%, grade 1 diastolic dysfunction/  trivial AR, MR and PR/  mild dilated ascending aorta/  mild TR  . TRANSURETHRAL RESECTION OF PROSTATE N/A 10/27/2017   Procedure: TRANSURETHRAL RESECTION OF THE PROSTATE (TURP);  Surgeon: Rene Paci, MD;  Location[facility];  Service: Urology;  Laterality: N/A;  . UPPER GASTROINTESTINAL ENDOSCOPY  last one 02-15-2014    Family History  Problem Relation Age of Onset  . Coronary artery disease Father   . Colon cancer Father        ? unknown age dx/died at 81  . Heart attack Paternal Grandfather   . Coronary artery disease Maternal Grandmother   . Colon cancer Maternal Grandmother 69  . Cancer Maternal Grandmother        colon  . Melanoma Mother   . Coronary artery disease Paternal Grandmother   . Heart attack Maternal Grandfather   . Hypertension Maternal Aunt   . Prostate cancer Neg Hx   . Stroke Neg Hx   . Esophageal cancer Neg Hx   . Pancreatic cancer Neg Hx   . Rectal cancer Neg Hx   . Stomach cancer Neg Hx     Allergies  Allergen Reactions  . Augmentin [Amoxicillin-Pot Clavulanate] Diarrhea    *Can take plain Amoxicillin* Has patient had a PCN reaction causing immediate rash, facial/tongue/throat swelling, SOB or lightheadedness with hypotension: No Has patient had a PCN reaction causing severe rash involving mucus membranes or skin necrosis: No Has patient had a PCN reaction that required hospitalization No Has patient had a PCN reaction occurring within the last 10 years: Yes If all of the above answers are "NO", then may proceed with Cephalosporin use.   Marland Kitchen Penicillins Rash    Childhood reaction *Can take Amoxicillin* Has patient had a PCN reaction causing immediate rash, facial/tongue/throat swelling,  SOB or lightheadedness with hypotension: Unknown Has patient had a PCN reaction causing severe rash involving mucus membranes or skin necrosis: Unknown Has patient had a PCN reaction that required hospitalization: Unknown Has patient had a PCN reaction occurring within the last 10 years: Unknown  If all of the above answers are "NO", then may proceed with Cephalosporin use    Current Outpatient Medications on File  Prior to Visit  Medication Sig Dispense Refill  . aspirin EC 81 MG tablet Take 81 mg by mouth every other day.    . Ciclopirox 1 % shampoo APP TO SCALP UTD AND PRN  2  . Glycerin-Polysorbate 80 (REFRESH DRY EYE THERAPY OP) Apply to eye as needed.    Marland Kitchen ketoconazole (NIZORAL) 2 % shampoo Apply 1 application topically as needed for irritation. For eczema    . losartan (COZAAR) 100 MG tablet TAKE 1 TABLET(100 MG) BY MOUTH DAILY 90 tablet 0  . Magnesium Malate 1250 (141.7 Mg) MG TABS Take 1 tablet by mouth 3 (three) times a week.    . meclizine (ANTIVERT) 25 MG tablet Take 25 mg by mouth 3 (three) times daily as needed for dizziness.    . metoprolol tartrate (LOPRESSOR) 25 MG tablet Take 1 tablet (25 mg total) by mouth 2 (two) times daily. 180 tablet 0  . Multiple Vitamins-Minerals (MULTIVITAMIN ADULTS 50+ PO) Take 1 tablet by mouth daily.    . Omega-3 Fatty Acids (FISH OIL) 1200 MG CAPS Take 2 capsules (2,400 mg total) by mouth 2 (two) times daily. (Patient taking differently: Take 2 capsules by mouth daily. )     No current facility-administered medications on file prior to visit.     BP (!) 145/79 (BP Location: Right Arm, Patient Position: Sitting, Cuff Size: Small)   Pulse (!) 57   Temp 98 F (36.7 C) (Oral)   Resp 16   Ht 5\' 6"  (1.676 m)   Wt 181 lb (82.1 kg)   SpO2 97%   BMI 29.21 kg/m        Objective:   Physical Exam  Constitutional: He is oriented to person, place, and time. He appears well-developed and well-nourished. No distress.  HENT:  Head: Normocephalic  and atraumatic.  Cardiovascular: Normal rate and regular rhythm.  No murmur heard. Pulmonary/Chest: Effort normal and breath sounds normal. No respiratory distress. He has no wheezes. He has no rales.  Musculoskeletal: He exhibits no edema.  Neurological: He is alert and oriented to person, place, and time.  Skin: Skin is warm and dry.  Psychiatric: He has a normal mood and affect. His behavior is normal. Thought content normal.          Assessment & Plan:  HTN- BP is acceptable for his age. Continue current medications, obtain follow up bmet.   Hematuria- will request copy of cystoscopy report. Was reportedly negative per pt.  He has follow up with urology.

## 2018-03-01 NOTE — Telephone Encounter (Signed)
Could you please contact [facility] and request copy of his cystoscopy report?

## 2018-03-01 NOTE — Patient Instructions (Signed)
Please complete lab work prior to leaving.   

## 2018-03-02 LAB — BASIC METABOLIC PANEL
BUN: 22 mg/dL (ref 6–23)
CALCIUM: 9.1 mg/dL (ref 8.4–10.5)
CO2: 28 mEq/L (ref 19–32)
CREATININE: 1 mg/dL (ref 0.40–1.50)
Chloride: 104 mEq/L (ref 96–112)
GFR: 78.19 mL/min (ref >60.00)
Glucose, Bld: 89 mg/dL (ref 70–99)
Potassium: 4.2 mEq/L (ref 3.5–5.1)
Sodium: 140 mEq/L (ref 135–145)

## 2018-03-04 NOTE — Telephone Encounter (Signed)
Requested report be faxed to [phone], attn: Rylee Nuzum. Awaiting report.

## 2018-03-04 NOTE — Telephone Encounter (Signed)
Cystoscopy report received and placed in PCP's red folder for review.

## 2018-03-10 ENCOUNTER — Encounter: Payer: Self-pay | Admitting: Family

## 2018-03-19 ENCOUNTER — Other Ambulatory Visit: Payer: Self-pay | Admitting: Family

## 2018-04-05 DIAGNOSIS — R3912 Poor urinary stream: Secondary | ICD-10-CM | POA: Diagnosis not present

## 2018-04-05 DIAGNOSIS — N401 Enlarged prostate with lower urinary tract symptoms: Secondary | ICD-10-CM | POA: Diagnosis not present

## 2018-04-14 ENCOUNTER — Ambulatory Visit: Payer: Medicare Other | Admitting: *Deleted

## 2018-04-19 NOTE — Progress Notes (Signed)
Subjective:   Gerald [name] is a 73 y.o. male who presents for Medicare Annual/Subsequent preventive examination.  Review of Systems: No ROS.  Medicare Wellness Visit. Additional risk factors are reflected in the social history. Cardiac Risk Factors include: advanced age (>37men, >22 women);dyslipidemia;hypertension;male gender   Sleep patterns: wakes twice to urinate.  Home Safety/Smoke Alarms: Feels safe in home. Smoke alarms in place.  Lives with wife with and son with CP.  Aide comes 8 hrs per week.  Eye: every 6 months  Male:   CCS-  Next due 03/2027    PSA-  Lab Results  Component Value Date   PSA 6.28 (H) 09/01/2011   PSA 5.14 (H) 05/14/2011   PSA 4.32 (H) 10/23/2010   '    Objective:    Vitals: BP 132/78 (BP Location: Left Arm, Patient Position: Sitting, Cuff Size: Normal)   Pulse (!) 56   Ht 5\' 6"  (1.676 m)   Wt 177 lb 9.6 oz (80.6 kg)   SpO2 96%   BMI 28.67 kg/m   Body mass index is 28.67 kg/m.  Advanced Directives 04/20/2018 10/27/2017 09/14/2017 04/13/2017 07/29/2016 06/06/2016 06/06/2016  Does Patient Have a Medical Advance Directive? Yes Yes Yes Yes Yes Yes Yes  Type of Advance Directive Living will;Healthcare Power of [place];Living will [place];Living will [place];Living will -  Does patient want to make changes to medical advance directive? - No - Patient declined No - Patient declined No - Patient declined No - Patient declined Yes (Inpatient - patient defers changing a medical advance directive at this time) -  Copy of [place] in Chart? No - copy requested - - No - copy requested No - copy requested - -    Tobacco Social History   Tobacco Use  Smoking Status Former Smoker  . Packs/day: 2.00  . Years: 30.00  . Pack years: 60.00  . Types: Cigarettes  . Last attempt to quit: 08/29/2006  . Years since quitting: 11.6  Smokeless Tobacco Never Used       Counseling given: Not Answered   Clinical Intake:     Pain : No/denies pain                 Past Medical History:  Diagnosis Date  . Back pain, chronic   . Benign localized prostatic hyperplasia with lower urinary tract symptoms (LUTS)   . Bladder outlet obstruction   . Bleeding from urethra in male 10/26/2017   3 times over 7 hours  . Depression   . Diverticulosis of colon   . Elevated PSA   . Fatty liver   . GERD (gastroesophageal reflux disease)   . Hiatal hernia   . History of alcohol abuse    quit 1989  . History of esophageal stricture    2003  S/P  DILATATION  . History of esophagitis   . History of melanoma in situ    06/ 2017  REMOVAL RIGHT CHEEK AREA  . History of sepsis 06/06/2016   admitted for urosepsis , UTI due to pseudomonas-- resolved   . Hx of adenomatous colonic polyps    2003   TUBULAR ADENOMA  . Hyperlipidemia   . Hypertension   . Mild sleep apnea    2004 mild, per sleep study-- no cpap recommended  (pt denies)   Past Surgical History:  Procedure Laterality Date  . CARDIOVASCULAR STRESS TEST  04-17-2015  dr  kelly   Normal stress nulcear study w/ a small, mild, fixed inferior defect consistent with inferior thinning; no ischemia; normal LV function and wall motion , stress ef 56%  . COLONOSCOPY  last one 12-02-2006  . CYSTOSCOPY WITH URETHRAL DILATATION N/A 10/27/2017   Procedure: CYSTOSCOPY WITH URETHRAL DILATATION;  Surgeon: Ceasar Mons, MD;  [facility];  Service: Urology;  Laterality: N/A;  ONLY NEEDS 90 MIN FOR ALL PROCEDURES  . EXCISION LEFT ARM MASS  1988  . MOHS SURGERY  09/06/2015   right parotidemomasseteric excision w/ complex repair for melanoma in situ  . THULIUM LASER TURP (TRANSURETHRAL RESECTION OF PROSTATE) N/A 07/29/2016   Procedure: Marcelino Duster LASER TURP (TRANSURETHRAL RESECTION OF PROSTATE)/ TRANSRECTAL ULTRASOUND GUIDED PROSTATE BIOPSY;  Surgeon: Nickie Retort, MD;  Location:  [facility];  Service: Urology;  Laterality: N/A;  . TONSILLECTOMY AND ADENOIDECTOMY  child  . TRANSTHORACIC ECHOCARDIOGRAM  04/16/2015   moderate LVH,  ef 12-45%, grade 1 diastolic dysfunction/  trivial AR, MR and PR/  mild dilated ascending aorta/  mild TR  . TRANSURETHRAL RESECTION OF PROSTATE N/A 10/27/2017   Procedure: TRANSURETHRAL RESECTION OF THE PROSTATE (TURP);  Surgeon: Ceasar Mons, MD;  Location: [facility];  Service: Urology;  Laterality: N/A;  . UPPER GASTROINTESTINAL ENDOSCOPY  last one 02-15-2014   Family History  Problem Relation Age of Onset  . Coronary artery disease Father   . Colon cancer Father        ? unknown age dx/died at 42  . Heart attack Paternal Grandfather   . Coronary artery disease Maternal Grandmother   . Colon cancer Maternal Grandmother 74  . Cancer Maternal Grandmother        colon  . Melanoma Mother   . Coronary artery disease Paternal Grandmother   . Heart attack Maternal Grandfather   . Hypertension Maternal Aunt   . Prostate cancer Neg Hx   . Stroke Neg Hx   . Esophageal cancer Neg Hx   . Pancreatic cancer Neg Hx   . Rectal cancer Neg Hx   . Stomach cancer Neg Hx    Social History   Socioeconomic History  . Marital status: Married    Spouse name: Not on file  . Number of children: 2  . Years of education: Not on file  . Highest education level: Not on file  Occupational History  . Occupation: Retired  Scientific laboratory technician  . Financial resource strain: Not on file  . Food insecurity:    Worry: Not on file    Inability: Not on file  . Transportation needs:    Medical: Not on file    Non-medical: Not on file  Tobacco Use  . Smoking status: Former Smoker    Packs/day: 2.00    Years: 30.00    Pack years: 60.00    Types: Cigarettes    Last attempt to quit: 08/29/2006    Years since quitting: 11.6  . Smokeless tobacco: Never Used  Substance and Sexual Activity  . Alcohol use: No  . Drug use:  No  . Sexual activity: Yes  Lifestyle  . Physical activity:    Days per week: Not on file    Minutes per session: Not on file  . Stress: Not on file  Relationships  . Social connections:    Talks on phone: Not on file    Gets together: Not on file    Attends religious service: Not on file  Active member of club or organization: Not on file    Attends meetings of clubs or organizations: Not on file    Relationship status: Not on file  Other Topics Concern  . Not on file  Social History Narrative   Diet: regular, lots of vegetables-----exercise : none but active   Caffeine use:  5 cups coffee daily          Outpatient Encounter Medications as of 04/20/2018  Medication Sig  . aspirin EC 81 MG tablet Take 81 mg by mouth every other day.  . Ciclopirox 1 % shampoo APP TO SCALP UTD AND PRN  . Glycerin-Polysorbate 80 (REFRESH DRY EYE THERAPY OP) Apply to eye as needed.  Marland Kitchen ketoconazole (NIZORAL) 2 % shampoo Apply 1 application topically as needed for irritation. For eczema  . losartan (COZAAR) 100 MG tablet TAKE 1 TABLET(100 MG) BY MOUTH DAILY  . Magnesium Malate 1250 (141.7 Mg) MG TABS Take 1 tablet by mouth 3 (three) times a week.  . meclizine (ANTIVERT) 25 MG tablet Take 25 mg by mouth 3 (three) times daily as needed for dizziness.  . metoprolol tartrate (LOPRESSOR) 25 MG tablet TAKE 1 TABLET(25 MG) BY MOUTH TWICE DAILY  . Multiple Vitamins-Minerals (MULTIVITAMIN ADULTS 50+ PO) Take 1 tablet by mouth daily.  . Omega-3 Fatty Acids (FISH OIL) 1200 MG CAPS Take 2 capsules (2,400 mg total) by mouth 2 (two) times daily. (Patient taking differently: Take 2 capsules by mouth daily. )   No facility-administered encounter medications on file as of 04/20/2018.     Activities of Daily Living In your present state of health, do you have any difficulty performing the following activities: 04/20/2018 10/27/2017  Hearing? N N  Vision? N N  Difficulty concentrating or making decisions? N N    Comment reads nightly -  Walking or climbing stairs? N N  Dressing or bathing? N N  Doing errands, shopping? N -  Preparing Food and eating ? N -  Using the Toilet? N -  In the past six months, have you accidently leaked urine? N -  Do you have problems with loss of bowel control? N -  Managing your Medications? N -  Managing your Finances? N -  Housekeeping or managing your Housekeeping? N -  Some recent data might be hidden    Patient Care Team: Debbrah Alar, NP as PCP - General (Internal Medicine)   Assessment:   This is a routine wellness examination for Gerald [name]. Physical assessment deferred to PCP.  Exercise Activities and Dietary recommendations Current Exercise Habits: The patient does not participate in regular exercise at present, Exercise limited by: None identified   Diet (meal preparation, eat out, water intake, caffeinated beverages, dairy products, fruits and vegetables): in general, a "healthy" diet  , on average, 3 meals per day     Goals    . Increase physical activity     Walk 66min 3 days/ week    . Weight (lb) < 175 lb (79.4 kg)       Fall Risk Fall Risk  04/20/2018 04/13/2017 04/04/2016 09/26/2015 03/19/2015  Falls in the past year? 0 No No No No    Depression Screen PHQ 2/9 Scores 04/20/2018 04/13/2017 04/04/2016 09/26/2015  PHQ - 2 Score 0 0 0 0    Cognitive Function MMSE - Mini Mental State Exam 04/13/2017 04/04/2016  Orientation to time 5 5  Orientation to Place 5 5  Registration 3 3  Attention/ Calculation 5 5  Recall 3  2  Language- name 2 objects 2 2  Language- repeat 1 1  Language- follow 3 step command 3 3  Language- read & follow direction 1 1  Write a sentence 1 1  Copy design 1 1  Total score 30 29        Immunization History  Administered Date(s) Administered  . Influenza Split 12/30/2010, 01/06/2012, 02/03/2017  . Influenza Whole 02/18/2007, 03/13/2009, 01/31/2010  . Influenza, High Dose Seasonal PF 01/29/2017, 12/29/2017   . Influenza-Unspecified 01/29/2013, 12/29/2013, 01/15/2016  . Pneumococcal Conjugate-13 03/15/2014  . Pneumococcal Polysaccharide-23 05/29/2005, 01/06/2012  . Td 04/20/2001, 03/15/2014  . Zoster 08/27/2011    Screening Tests Health Maintenance  Topic Date Due  . TETANUS/TDAP  03/15/2024  . COLONOSCOPY  03/28/2027  . INFLUENZA VACCINE  Completed  . Hepatitis C Screening  Completed  . PNA vac Low Risk Adult  Completed        Plan:    Please schedule your next medicare wellness visit with me in 1 yr.  Continue to eat heart healthy diet (full of fruits, vegetables, whole grains, lean protein, water--limit salt, fat, and sugar intake) and increase physical activity as tolerated.  Continue doing brain stimulating activities to keep memory sharp.   Bring a copy of your living will and/or healthcare power of attorney to your next office visit.   I have personally reviewed and noted the following in the patient's chart:   . Medical and social history . Use of alcohol, tobacco or illicit drugs  . Current medications and supplements . Functional ability and status . Nutritional status . Physical activity . Advanced directives . List of other physicians . Hospitalizations, surgeries, and ER visits in previous 12 months . Vitals . Screenings to include cognitive, depression, and falls . Referrals and appointments  In addition, I have reviewed and discussed with patient certain preventive protocols, quality metrics, and best practice recommendations. A written personalized care plan for preventive services as well as general preventive health recommendations were provided to patient.     Shela Nevin, [place]  04/20/2018

## 2018-04-20 ENCOUNTER — Encounter: Payer: Self-pay | Admitting: *Deleted

## 2018-04-20 ENCOUNTER — Ambulatory Visit (INDEPENDENT_AMBULATORY_CARE_PROVIDER_SITE_OTHER): Payer: Medicare Other | Admitting: *Deleted

## 2018-04-20 VITALS — BP 132/78 | HR 56 | Ht 66.0 in | Wt 177.6 lb

## 2018-04-20 DIAGNOSIS — Z0000 Encounter for general adult medical examination without abnormal findings: Secondary | ICD-10-CM

## 2018-04-20 NOTE — Progress Notes (Signed)
Reviewed  Yvonne R Lowne Chase, DO  

## 2018-04-20 NOTE — Patient Instructions (Signed)
Please schedule your next medicare wellness visit with me in 1 yr.  Continue to eat heart healthy diet (full of fruits, vegetables, whole grains, lean protein, water--limit salt, fat, and sugar intake) and increase physical activity as tolerated.  Continue doing brain stimulating activities to keep memory sharp.   Bring a copy of your living will and/or healthcare power of attorney to your next office visit.  Have a wonderful time on your cruise. Happy early anniversary!!  Gerald [name] , Thank you for taking time to come for your Medicare Wellness Visit. I appreciate your ongoing commitment to your health goals. Please review the following plan we discussed and let me know if I can assist you in the future.   These are the goals we discussed: Goals    . Increase physical activity     Walk 44min 3 days/ week    . Weight (lb) < 175 lb (79.4 kg)       This is a list of the screening recommended for you and due dates:  Health Maintenance  Topic Date Due  . Tetanus Vaccine  03/15/2024  . Colon Cancer Screening  03/28/2027  . Flu Shot  Completed  .  Hepatitis C: One time screening is recommended by Center for Disease Control  (CDC) for  adults born from [birth year] through 1964.   Completed  . Pneumonia vaccines  Completed    Health Maintenance After Age 31 After age 67, you are at a higher risk for certain long-term diseases and infections as well as injuries from falls. Falls are a major cause of broken bones and head injuries in people who are older than age 59. Getting regular preventive care can help to keep you healthy and well. Preventive care includes getting regular testing and making lifestyle changes as recommended by your health care provider. Talk with your health care provider about:  Which screenings and tests you should have. A screening is a test that checks for a disease when you have no symptoms.  A diet and exercise plan that is right for you. What should I know about  screenings and tests to prevent falls? Screening and testing are the best ways to find a health problem early. Early diagnosis and treatment give you the best chance of managing medical conditions that are common after age 66. Certain conditions and lifestyle choices may make you more likely to have a fall. Your health care provider may recommend:  Regular vision checks. Poor vision and conditions such as cataracts can make you more likely to have a fall. If you wear glasses, make sure to get your prescription updated if your vision changes.  Medicine review. Work with your health care provider to regularly review all of the medicines you are taking, including over-the-counter medicines. Ask your health care provider about any side effects that may make you more likely to have a fall. Tell your health care provider if any medicines that you take make you feel dizzy or sleepy.  Osteoporosis screening. Osteoporosis is a condition that causes the bones to get weaker. This can make the bones weak and cause them to break more easily.  Blood pressure screening. Blood pressure changes and medicines to control blood pressure can make you feel dizzy.  Strength and balance checks. Your health care provider may recommend certain tests to check your strength and balance while standing, walking, or changing positions.  Foot health exam. Foot pain and numbness, as well as not wearing proper footwear, can make  you more likely to have a fall.  Depression screening. You may be more likely to have a fall if you have a fear of falling, feel emotionally low, or feel unable to do activities that you used to do.  Alcohol use screening. Using too much alcohol can affect your balance and may make you more likely to have a fall. What actions can I take to lower my risk of falls? General instructions  Talk with your health care provider about your risks for falling. Tell your health care provider if: ? You fall. Be sure  to tell your health care provider about all falls, even ones that seem minor. ? You feel dizzy, sleepy, or off-balance.  Take over-the-counter and prescription medicines only as told by your health care provider. These include any supplements.  Eat a healthy diet and maintain a healthy weight. A healthy diet includes low-fat dairy products, low-fat (lean) meats, and fiber from whole grains, beans, and lots of fruits and vegetables. Home safety  Remove any tripping hazards, such as rugs, cords, and clutter.  Install safety equipment such as grab bars in bathrooms and safety rails on stairs.  Keep rooms and walkways well-lit. Activity   Follow a regular exercise program to stay fit. This will help you maintain your balance. Ask your health care provider what types of exercise are appropriate for you.  If you need a cane or walker, use it as recommended by your health care provider.  Wear supportive shoes that have nonskid soles. Lifestyle  Do not drink alcohol if your health care provider tells you not to drink.  If you drink alcohol, limit how much you have: ? 0-1 drink a day for women. ? 0-2 drinks a day for men.  Be aware of how much alcohol is in your drink. In the [place] one drink equals one typical bottle of beer (12 oz), one-half glass of wine (5 oz), or one shot of hard liquor (1 oz).  Do not use any products that contain nicotine or tobacco, such as cigarettes and e-cigarettes. If you need help quitting, ask your health care provider. Summary  Having a healthy lifestyle and getting preventive care can help to protect your health and wellness after age 31.  Screening and testing are the best way to find a health problem early and help you avoid having a fall. Early diagnosis and treatment give you the best chance for managing medical conditions that are more common for people who are older than age 78.  Falls are a major cause of broken bones and head injuries in people  who are older than age 14. Take precautions to prevent a fall at home.  Work with your health care provider to learn what changes you can make to improve your health and wellness and to prevent falls. This information is not intended to replace advice given to you by your health care provider. Make sure you discuss any questions you have with your health care provider. Document Released: 01/28/2017 Document Revised: 01/28/2017 Document Reviewed: 01/28/2017 Elsevier Interactive Patient Education  2019 [employer].

## 2018-06-09 ENCOUNTER — Encounter: Payer: Self-pay | Admitting: Family

## 2018-06-20 ENCOUNTER — Encounter: Payer: Self-pay | Admitting: Family

## 2018-06-20 ENCOUNTER — Other Ambulatory Visit: Payer: Self-pay | Admitting: Family

## 2018-06-21 MED ORDER — LOSARTAN POTASSIUM 100 MG PO TABS: 100.0000 mg | ORAL_TABLET | Freq: Every day | ORAL | 1 refills | Status: DC

## 2018-06-21 MED ORDER — METOPROLOL TARTRATE 25 MG PO TABS: 25.0000 mg | ORAL_TABLET | Freq: Two times a day (BID) | ORAL | 1 refills | Status: DC

## 2018-07-08 ENCOUNTER — Encounter: Payer: Self-pay | Admitting: Family

## 2018-07-16 ENCOUNTER — Telehealth: Payer: Self-pay

## 2018-07-16 NOTE — Telephone Encounter (Signed)
Patient reports he received a call from the cruise line to cancel, letter not needed anymore.

## 2018-07-16 NOTE — Telephone Encounter (Signed)
Copied from [place] [id number]. Topic: General - Inquiry >> Jul 15, 2018  2:22 PM Virl Axe D wrote: Reason for CRM: Pt stated he needs a letter from PCP stating that he is in good health to go on a cruise in May. Please advise or reach out to pt for questions. CB#[phone]

## 2018-08-31 ENCOUNTER — Other Ambulatory Visit: Payer: Self-pay

## 2018-08-31 ENCOUNTER — Ambulatory Visit: Payer: Medicare Other | Admitting: Family

## 2018-09-04 ENCOUNTER — Encounter: Payer: Self-pay | Admitting: Family

## 2018-09-06 NOTE — Telephone Encounter (Signed)
Patient was scheduled for in office follow up for 09-21-18

## 2018-09-14 DIAGNOSIS — D225 Melanocytic nevi of trunk: Secondary | ICD-10-CM | POA: Diagnosis not present

## 2018-09-14 DIAGNOSIS — D0339 Melanoma in situ of other parts of face: Secondary | ICD-10-CM | POA: Diagnosis not present

## 2018-09-14 DIAGNOSIS — L2089 Other atopic dermatitis: Secondary | ICD-10-CM | POA: Diagnosis not present

## 2018-09-14 DIAGNOSIS — D485 Neoplasm of uncertain behavior of skin: Secondary | ICD-10-CM | POA: Diagnosis not present

## 2018-09-14 DIAGNOSIS — Z85820 Personal history of malignant melanoma of skin: Secondary | ICD-10-CM | POA: Diagnosis not present

## 2018-09-14 DIAGNOSIS — D1801 Hemangioma of skin and subcutaneous tissue: Secondary | ICD-10-CM | POA: Diagnosis not present

## 2018-09-14 DIAGNOSIS — L821 Other seborrheic keratosis: Secondary | ICD-10-CM | POA: Diagnosis not present

## 2018-09-14 DIAGNOSIS — L820 Inflamed seborrheic keratosis: Secondary | ICD-10-CM | POA: Diagnosis not present

## 2018-09-21 ENCOUNTER — Ambulatory Visit (INDEPENDENT_AMBULATORY_CARE_PROVIDER_SITE_OTHER): Payer: Medicare Other | Admitting: Family

## 2018-09-21 ENCOUNTER — Encounter: Payer: Self-pay | Admitting: Family

## 2018-09-21 ENCOUNTER — Other Ambulatory Visit: Payer: Self-pay

## 2018-09-21 VITALS — BP 152/74 | HR 48 | Temp 98.7°F | Resp 16 | Ht 66.5 in | Wt 166.0 lb

## 2018-09-21 DIAGNOSIS — I10 Essential (primary) hypertension: Secondary | ICD-10-CM

## 2018-09-21 DIAGNOSIS — E785 Hyperlipidemia, unspecified: Secondary | ICD-10-CM

## 2018-09-21 DIAGNOSIS — R0789 Other chest pain: Secondary | ICD-10-CM

## 2018-09-21 NOTE — Progress Notes (Signed)
Subjective:    Patient ID: Gerald [name], male    DOB: Oct 30, 1945, 73 y.o.   MRN: [id number]  HPI  Patient is a 73 yr old male who presents today for follow up.  1) HTN- maintained on metoprolol 25mg  bid. Also maintained on losartan. Reports good complaince BP Readings from Last 3 Encounters:  09/21/18 (!) 152/74  04/20/18 132/78  03/01/18 (!) 145/79   2) GERD- reports "indigestion" and belching "a lot."   3) Chest pain- reports intermittent chest pain. Occurs first thing in the AM.  Not worsened by exertion.    Review of Systems Past Medical History:  Diagnosis Date  . Back pain, chronic   . Benign localized prostatic hyperplasia with lower urinary tract symptoms (LUTS)   . Bladder outlet obstruction   . Bleeding from urethra in male 10/26/2017   3 times over 7 hours  . Depression   . Diverticulosis of colon   . Elevated PSA   . Fatty liver   . GERD (gastroesophageal reflux disease)   . Hiatal hernia   . History of alcohol abuse    quit 1989  . History of esophageal stricture    2003  S/P  DILATATION  . History of esophagitis   . History of melanoma in situ    06/ 2017  REMOVAL RIGHT CHEEK AREA  . History of sepsis 06/06/2016   admitted for urosepsis , UTI due to pseudomonas-- resolved   . Hx of adenomatous colonic polyps    2003   TUBULAR ADENOMA  . Hyperlipidemia   . Hypertension   . Mild sleep apnea    2004 mild, per sleep study-- no cpap recommended  (pt denies)     Social History   Socioeconomic History  . Marital status: Married    Spouse name: Not on file  . Number of children: 2  . Years of education: Not on file  . Highest education level: Not on file  Occupational History  . Occupation: Retired  Engineer, production  . Financial resource strain: Not on file  . Food insecurity    Worry: Not on file    Inability: Not on file  . Transportation needs    Medical: Not on file    Non-medical: Not on file  Tobacco Use  . Smoking status: Former  Smoker    Packs/day: 2.00    Years: 30.00    Pack years: 60.00    Types: Cigarettes    Quit date: 08/29/2006    Years since quitting: 12.0  . Smokeless tobacco: Never Used  Substance and Sexual Activity  . Alcohol use: No  . Drug use: No  . Sexual activity: Yes  Lifestyle  . Physical activity    Days per week: Not on file    Minutes per session: Not on file  . Stress: Not on file  Relationships  . Social Musician on phone: Not on file    Gets together: Not on file    Attends religious service: Not on file    Active member of club or organization: Not on file    Attends meetings of clubs or organizations: Not on file    Relationship status: Not on file  . Intimate partner violence    Fear of current or ex partner: Not on file    Emotionally abused: Not on file    Physically abused: Not on file    Forced sexual activity: Not on file  Other Topics  Concern  . Not on file  Social History Narrative   Diet: regular, lots of vegetables-----exercise : none but active   Caffeine use:  5 cups coffee daily          Past Surgical History:  Procedure Laterality Date  . CARDIOVASCULAR STRESS TEST  04-17-2015  dr Tresa Endo   Normal stress nulcear study w/ a small, mild, fixed inferior defect consistent with inferior thinning; no ischemia; normal LV function and wall motion , stress ef 56%  . COLONOSCOPY  last one 12-02-2006  . CYSTOSCOPY WITH URETHRAL DILATATION N/A 10/27/2017   Procedure: CYSTOSCOPY WITH URETHRAL DILATATION;  Surgeon: Rene Paci, MD;  Location: [facility];  Service: Urology;  Laterality: N/A;  ONLY NEEDS 90 MIN FOR ALL PROCEDURES  . EXCISION LEFT ARM MASS  1988  . MOHS SURGERY  09/06/2015   right parotidemomasseteric excision w/ complex repair for melanoma in situ  . THULIUM LASER TURP (TRANSURETHRAL RESECTION OF PROSTATE) N/A 07/29/2016   Procedure: Morton Peters LASER TURP (TRANSURETHRAL RESECTION OF PROSTATE)/ TRANSRECTAL ULTRASOUND  GUIDED PROSTATE BIOPSY;  Surgeon: Hildred Laser, MD;  Location: [facility];  Service: Urology;  Laterality: N/A;  . TONSILLECTOMY AND ADENOIDECTOMY  child  . TRANSTHORACIC ECHOCARDIOGRAM  04/16/2015   moderate LVH,  ef 60-65%, grade 1 diastolic dysfunction/  trivial AR, MR and PR/  mild dilated ascending aorta/  mild TR  . TRANSURETHRAL RESECTION OF PROSTATE N/A 10/27/2017   Procedure: TRANSURETHRAL RESECTION OF THE PROSTATE (TURP);  Surgeon: Rene Paci, MD;  Location: [facility];  Service: Urology;  Laterality: N/A;  . UPPER GASTROINTESTINAL ENDOSCOPY  last one 02-15-2014    Family History  Problem Relation Age of Onset  . Coronary artery disease Father   . Colon cancer Father        ? unknown age dx/died at 13  . Heart attack Paternal Grandfather   . Coronary artery disease Maternal Grandmother   . Colon cancer Maternal Grandmother 24  . Cancer Maternal Grandmother        colon  . Melanoma Mother   . Coronary artery disease Paternal Grandmother   . Heart attack Maternal Grandfather   . Hypertension Maternal Aunt   . Prostate cancer Neg Hx   . Stroke Neg Hx   . Esophageal cancer Neg Hx   . Pancreatic cancer Neg Hx   . Rectal cancer Neg Hx   . Stomach cancer Neg Hx     Allergies  Allergen Reactions  . Augmentin [Amoxicillin-Pot Clavulanate] Diarrhea    *Can take plain Amoxicillin* Has patient had a PCN reaction causing immediate rash, facial/tongue/throat swelling, SOB or lightheadedness with hypotension: No Has patient had a PCN reaction causing severe rash involving mucus membranes or skin necrosis: No Has patient had a PCN reaction that required hospitalization No Has patient had a PCN reaction occurring within the last 10 years: Yes If all of the above answers are "NO", then may proceed with Cephalosporin use.   Marland Kitchen Penicillins Rash    Childhood reaction *Can take Amoxicillin* Has patient had a PCN reaction causing  immediate rash, facial/tongue/throat swelling, SOB or lightheadedness with hypotension: Unknown Has patient had a PCN reaction causing severe rash involving mucus membranes or skin necrosis: Unknown Has patient had a PCN reaction that required hospitalization: Unknown Has patient had a PCN reaction occurring within the last 10 years: Unknown  If all of the above answers are "NO", then may proceed with Cephalosporin use  Current Outpatient Medications on File Prior to Visit  Medication Sig Dispense Refill  . aspirin EC 81 MG tablet Take 81 mg by mouth every other day.    . Ciclopirox 1 % shampoo APP TO SCALP UTD AND PRN  2  . Glycerin-Polysorbate 80 (REFRESH DRY EYE THERAPY OP) Apply to eye as needed.    Marland Kitchen ketoconazole (NIZORAL) 2 % shampoo Apply 1 application topically as needed for irritation. For eczema    . losartan (COZAAR) 100 MG tablet Take 1 tablet (100 mg total) by mouth daily. 90 tablet 1  . Magnesium Malate 1250 (141.7 Mg) MG TABS Take 1 tablet by mouth 3 (three) times a week.    . meclizine (ANTIVERT) 25 MG tablet Take 25 mg by mouth 3 (three) times daily as needed for dizziness.    . metoprolol tartrate (LOPRESSOR) 25 MG tablet Take 1 tablet (25 mg total) by mouth 2 (two) times daily. 180 tablet 1  . Multiple Vitamins-Minerals (MULTIVITAMIN ADULTS 50+ PO) Take 1 tablet by mouth daily.    . Omega-3 Fatty Acids (FISH OIL) 1200 MG CAPS Take 2 capsules (2,400 mg total) by mouth 2 (two) times daily. (Patient taking differently: Take 2 capsules by mouth daily. )     No current facility-administered medications on file prior to visit.     BP (!) 152/74 (BP Location: Left Arm, Patient Position: Sitting, Cuff Size: Small)   Pulse (!) 48   Temp 98.7 F (37.1 C) (Oral)   Resp 16   Ht 5' 6.5" (1.689 m)   Wt 166 lb (75.3 kg)   SpO2 98%   BMI 26.39 kg/m        Objective:   Physical Exam Constitutional:      General: He is not in acute distress.    Appearance: He is  well-developed.  HENT:     Head: Normocephalic and atraumatic.  Cardiovascular:     Rate and Rhythm: Normal rate and regular rhythm.     Heart sounds: No murmur.  Pulmonary:     Effort: Pulmonary effort is normal. No respiratory distress.     Breath sounds: Normal breath sounds. No wheezing or rales.  Skin:    General: Skin is warm and dry.  Neurological:     Mental Status: He is alert and oriented to person, place, and time.  Psychiatric:        Behavior: Behavior normal.        Thought Content: Thought content normal.           Assessment & Plan:  HTN- fair bp. Continue current meds. Will recheck bmet in 3 months.  Hyperlidemia- declines lipid panel today- plan to recheck next visit.  Atypical chest pain-EKG tracing is personally reviewed.  EKG notes NSR (sinus brady with PAC's).  No acute changes. I suspect that his symptoms are gerd related. Offered PPI. He declined. He is agreeable to cardiology consult. He is advised to go to the ER if he develops recurrent chest pain. Pt verbalizes understanding.

## 2018-09-21 NOTE — Patient Instructions (Signed)
You will be contacted about scheduling your appointment with cardiology. Go to the ER if you develop recurrent chest pain.

## 2018-09-22 LAB — BASIC METABOLIC PANEL
BUN: 28 mg/dL — ABNORMAL HIGH (ref 6–23)
CO2: 27 mEq/L (ref 19–32)
Calcium: 8.9 mg/dL (ref 8.4–10.5)
Chloride: 106 mEq/L (ref 96–112)
Creatinine, Ser: 0.93 mg/dL (ref 0.40–1.50)
GFR: 79.86 mL/min (ref >60.00)
Glucose, Bld: 84 mg/dL (ref 70–99)
Potassium: 4.1 mEq/L (ref 3.5–5.1)
Sodium: 140 mEq/L (ref 135–145)

## 2018-09-27 ENCOUNTER — Encounter: Payer: Self-pay | Admitting: Family

## 2018-09-30 ENCOUNTER — Ambulatory Visit: Payer: PRIVATE HEALTH INSURANCE | Admitting: Cardiology

## 2018-10-05 DIAGNOSIS — N401 Enlarged prostate with lower urinary tract symptoms: Secondary | ICD-10-CM | POA: Diagnosis not present

## 2018-10-07 DIAGNOSIS — H5361 Abnormal dark adaptation curve: Secondary | ICD-10-CM | POA: Diagnosis not present

## 2018-10-07 DIAGNOSIS — H2513 Age-related nuclear cataract, bilateral: Secondary | ICD-10-CM | POA: Diagnosis not present

## 2018-10-11 DIAGNOSIS — D0339 Melanoma in situ of other parts of face: Secondary | ICD-10-CM | POA: Diagnosis not present

## 2018-10-11 DIAGNOSIS — L988 Other specified disorders of the skin and subcutaneous tissue: Secondary | ICD-10-CM | POA: Diagnosis not present

## 2018-10-12 DIAGNOSIS — D0339 Melanoma in situ of other parts of face: Secondary | ICD-10-CM | POA: Diagnosis not present

## 2018-10-17 NOTE — Progress Notes (Signed)
Cardiology Office Note:    Date:  10/18/2018   ID:  Gerald [name], DOB Jan 29, 1946, MRN [id number]  PCP:  Sandford Craze, NP  Cardiologist:  Norman Herrlich, MD   Referring MD: Sandford Craze, NP  ASSESSMENT:    1. Chest pain of uncertain etiology   2. Hypertensive left ventricular hypertrophy, without heart failure   3. Mixed hyperlipidemia   4. Chest pain in adult   5. Essential hypertension    PLAN:    In order of problems listed above:  1. Chest pain quite atypical sounds esophageal but increased cardiovascular risk undergo further evaluation with a cardiac CTA after decision made with the patient regarding modalities and noninvasive imaging versus CT. 2. Hypertension stable continue current treatment 3. Hyperlipidemia stable if he has CAD he will require statin  Next appointment 6 weeks after CTA is completed   Medication Adjustments/Labs and Tests Ordered: Current medicines are reviewed at length with the patient today.  Concerns regarding medicines are outlined above.  No orders of the defined types were placed in this encounter.  No orders of the defined types were placed in this encounter.    No chief complaint on file.   History of Present Illness:    Gerald [name] is a 73 y.o. male who is being seen today for the evaluation of chest pain at the request of Sandford Craze, NP.  His EKG 09/21/2018 showed sinus rhythm atrial bigeminy left atrial abnormality.  Cardiovascular risk factors include hypertension and hyperlipidemia.  He also has a history of GERD and esophageal stricture.  About a month ago he is having a pattern of nocturnal chest pain it would be substernal radiates through to the back associated with indigestion heartburn belching and everything was relieved upright posture and walking.  He has no exertional chest pain dyspnea palpitation or syncope his EKG was abnormal with atrial bigeminy.  He was started on a PPI and since that  time his symptoms have resolved.  He has no known history of heart disease congenital rheumatic and had no awareness of atrial arrhythmia. Past Medical History:  Diagnosis Date   Back pain, chronic    Benign localized prostatic hyperplasia with lower urinary tract symptoms (LUTS)    Bladder outlet obstruction    Bleeding from urethra in male 10/26/2017   3 times over 7 hours   Depression    Diverticulosis of colon    Elevated PSA    Fatty liver    GERD (gastroesophageal reflux disease)    Hiatal hernia    History of alcohol abuse    quit 1989   History of esophageal stricture    2003  S/P  DILATATION   History of esophagitis    History of melanoma in situ    06/ 2017  REMOVAL RIGHT CHEEK AREA   History of sepsis 06/06/2016   admitted for urosepsis , UTI due to pseudomonas-- resolved    Hx of adenomatous colonic polyps    2003   TUBULAR ADENOMA   Hyperlipidemia    Hypertension    Mild sleep apnea    2004 mild, per sleep study-- no cpap recommended  (pt denies)    Past Surgical History:  Procedure Laterality Date   CARDIOVASCULAR STRESS TEST  04-17-2015  dr Tresa Endo   Normal stress nulcear study w/ a small, mild, fixed inferior defect consistent with inferior thinning; no ischemia; normal LV function and wall motion , stress ef 56%   COLONOSCOPY  last one 12-02-2006  CYSTOSCOPY WITH URETHRAL DILATATION N/A 10/27/2017   Procedure: CYSTOSCOPY WITH URETHRAL DILATATION;  Surgeon: Rene Paci, MD;  Location: [facility];  Service: Urology;  Laterality: N/A;  ONLY NEEDS 90 MIN FOR ALL PROCEDURES   EXCISION LEFT ARM MASS  1988   MOHS SURGERY  09/06/2015   right parotidemomasseteric excision w/ complex repair for melanoma in situ   THULIUM LASER TURP (TRANSURETHRAL RESECTION OF PROSTATE) N/A 07/29/2016   Procedure: Morton Peters LASER TURP (TRANSURETHRAL RESECTION OF PROSTATE)/ TRANSRECTAL ULTRASOUND GUIDED PROSTATE BIOPSY;  Surgeon: Hildred Laser, MD;  Location: [facility];  Service: Urology;  Laterality: N/A;   TONSILLECTOMY AND ADENOIDECTOMY  child   TRANSTHORACIC ECHOCARDIOGRAM  04/16/2015   moderate LVH,  ef 60-65%, grade 1 diastolic dysfunction/  trivial AR, MR and PR/  mild dilated ascending aorta/  mild TR   TRANSURETHRAL RESECTION OF PROSTATE N/A 10/27/2017   Procedure: TRANSURETHRAL RESECTION OF THE PROSTATE (TURP);  Surgeon: Rene Paci, MD;  Location: [facility];  Service: Urology;  Laterality: N/A;   UPPER GASTROINTESTINAL ENDOSCOPY  last one 02-15-2014    Current Medications: Current Meds  Medication Sig   Ciclopirox 1 % shampoo APP TO SCALP UTD AND PRN   Glycerin-Polysorbate 80 (REFRESH DRY EYE THERAPY OP) Apply to eye as needed.   ketoconazole (NIZORAL) 2 % shampoo Apply 1 application topically as needed for irritation. For eczema   losartan (COZAAR) 100 MG tablet Take 1 tablet (100 mg total) by mouth daily.   Magnesium Malate 1250 (141.7 Mg) MG TABS Take 1 tablet by mouth 3 (three) times a week.   meclizine (ANTIVERT) 25 MG tablet Take 25 mg by mouth 3 (three) times daily as needed for dizziness.   metoprolol tartrate (LOPRESSOR) 25 MG tablet Take 1 tablet (25 mg total) by mouth 2 (two) times daily.   Multiple Vitamins-Minerals (MULTIVITAMIN ADULTS 50+ PO) Take 1 tablet by mouth daily.   Omega-3 Fatty Acids (FISH OIL) 1200 MG CAPS Take 1 capsule by mouth daily.     Allergies:   Augmentin [amoxicillin-pot clavulanate] and Penicillins   Social History   Socioeconomic History   Marital status: Married    Spouse name: Not on file   Number of children: 2   Years of education: Not on file   Highest education level: Not on file  Occupational History   Occupation: Retired  Ecologist strain: Not on file   Food insecurity    Worry: Not on file    Inability: Not on Occupational hygienist needs    Medical: Not on  file    Non-medical: Not on file  Tobacco Use   Smoking status: Former Smoker    Packs/day: 2.00    Years: 30.00    Pack years: 60.00    Types: Cigarettes    Quit date: 08/29/2006    Years since quitting: 12.1   Smokeless tobacco: Never Used  Substance and Sexual Activity   Alcohol use: No   Drug use: No   Sexual activity: Yes  Lifestyle   Physical activity    Days per week: Not on file    Minutes per session: Not on file   Stress: Not on file  Relationships   Social connections    Talks on phone: Not on file    Gets together: Not on file    Attends religious service: Not on file    Active member of club or organization:  Not on file    Attends meetings of clubs or organizations: Not on file    Relationship status: Not on file  Other Topics Concern   Not on file  Social History Narrative   Diet: regular, lots of vegetables-----exercise : none but active   Caffeine use:  5 cups coffee daily           Family History: The patient's family history includes Cancer in his maternal grandmother; Colon cancer in his father; Colon cancer (age of onset: 48) in his maternal grandmother; Coronary artery disease in his father, maternal grandmother, and paternal grandmother; Heart attack in his maternal grandfather and paternal grandfather; Hypertension in his maternal aunt; Melanoma in his mother. There is no history of Prostate cancer, Stroke, Esophageal cancer, Pancreatic cancer, Rectal cancer, or Stomach cancer.  ROS:   Review of Systems  Constitution: Negative.  HENT: Negative.   Eyes: Negative.   Cardiovascular: Positive for chest pain.  Respiratory: Negative.   Endocrine: Negative.   Hematologic/Lymphatic: Negative.   Skin: Positive for skin cancer.  Musculoskeletal: Negative.   Gastrointestinal: Positive for heartburn.  Genitourinary: Negative.   Neurological: Negative.   Psychiatric/Behavioral: Negative.   Allergic/Immunologic: Negative.    Please see the  history of present illness.     All other systems reviewed and are negative.  EKGs/Labs/Other Studies Reviewed:    The following studies were reviewed today:   EKG:  EKG is  ordered today.  The ekg ordered today is personally reviewed and demonstrates sinus rhythm with occasional APCs  Recent Labs: 10/28/2017: Hemoglobin 14.0 09/21/2018: BUN 28; Creatinine, Ser 0.93; Potassium 4.1; Sodium 140  Recent Lipid Panel    Component Value Date/Time   CHOL 164 03/17/2017 0914   TRIG 121.0 03/17/2017 0914   HDL 40.40 03/17/2017 0914   CHOLHDL 4 03/17/2017 0914   VLDL 24.2 03/17/2017 0914   LDLCALC 100 (H) 03/17/2017 0914   LDLDIRECT 88.0 09/17/2016 1233    Physical Exam:    VS:  BP 124/68    Pulse (!) 54    Temp 97.7 F (36.5 C)    Ht 5' 6.5" (1.689 m)    Wt 176 lb 1.9 oz (79.9 kg)    SpO2 97%    BMI 28.00 kg/m     Wt Readings from Last 3 Encounters:  10/18/18 176 lb 1.9 oz (79.9 kg)  09/21/18 166 lb (75.3 kg)  04/20/18 177 lb 9.6 oz (80.6 kg)     GEN:  Well nourished, well developed in no acute distress HEENT: Normal NECK: No JVD; No carotid bruits LYMPHATICS: No lymphadenopathy CARDIAC: RRR, no murmurs, rubs, gallops RESPIRATORY:  Clear to auscultation without rales, wheezing or rhonchi  ABDOMEN: Soft, non-tender, non-distended MUSCULOSKELETAL:  No edema; No deformity  SKIN: Warm and dry NEUROLOGIC:  Alert and oriented x 3 PSYCHIATRIC:  Normal affect     Signed, Norman Herrlich, MD  10/18/2018 2:16 PM    [facility]

## 2018-10-18 ENCOUNTER — Ambulatory Visit (INDEPENDENT_AMBULATORY_CARE_PROVIDER_SITE_OTHER): Payer: Medicare Other | Admitting: Cardiology

## 2018-10-18 ENCOUNTER — Other Ambulatory Visit: Payer: Self-pay

## 2018-10-18 ENCOUNTER — Encounter: Payer: Self-pay | Admitting: Cardiology

## 2018-10-18 VITALS — BP 126/70 | HR 54 | Temp 97.7°F | Ht 66.5 in | Wt 176.1 lb

## 2018-10-18 DIAGNOSIS — R079 Chest pain, unspecified: Secondary | ICD-10-CM

## 2018-10-18 DIAGNOSIS — R072 Precordial pain: Secondary | ICD-10-CM | POA: Diagnosis not present

## 2018-10-18 DIAGNOSIS — I491 Atrial premature depolarization: Secondary | ICD-10-CM | POA: Insufficient documentation

## 2018-10-18 DIAGNOSIS — E782 Mixed hyperlipidemia: Secondary | ICD-10-CM | POA: Diagnosis not present

## 2018-10-18 DIAGNOSIS — R351 Nocturia: Secondary | ICD-10-CM | POA: Diagnosis not present

## 2018-10-18 DIAGNOSIS — R972 Elevated prostate specific antigen [PSA]: Secondary | ICD-10-CM | POA: Diagnosis not present

## 2018-10-18 DIAGNOSIS — I119 Hypertensive heart disease without heart failure: Secondary | ICD-10-CM

## 2018-10-18 DIAGNOSIS — I10 Essential (primary) hypertension: Secondary | ICD-10-CM

## 2018-10-18 DIAGNOSIS — N401 Enlarged prostate with lower urinary tract symptoms: Secondary | ICD-10-CM | POA: Diagnosis not present

## 2018-10-18 DIAGNOSIS — N528 Other male erectile dysfunction: Secondary | ICD-10-CM | POA: Diagnosis not present

## 2018-10-18 HISTORY — DX: Chest pain, unspecified: R07.9

## 2018-10-18 HISTORY — DX: Atrial premature depolarization: I49.1

## 2018-10-18 NOTE — Patient Instructions (Signed)
Medication Instructions:  Your physician recommends that you continue on your current medications as directed. Please refer to the Current Medication list given to you today.  If you need a refill on your cardiac medications before your next appointment, please call your pharmacy.   Lab work: Your physician recommends that you return for lab work in:  3-7 days prior to CT; BMP  If you have labs (blood work) drawn today and your tests are completely normal, you will receive your results only by: Marland Kitchen MyChart Message (if you have MyChart) OR . A paper copy in the mail If you have any lab test that is abnormal or we need to change your treatment, we will call you to review the results.  Testing/Procedures: Your physician has requested that you have cardiac CT. Cardiac computed tomography (CT) is a painless test that uses an x-ray machine to take clear, detailed pictures of your heart. For further information please visit [email or web address][employer][email or web address]. Please follow instruction sheet as given.  Please arrive at the [facility] main entrance of [facility] at xx:xx AM (30-45 minutes prior to test start time)  [facility] [address] [place], [place] [address] [phone]  Proceed to the [facility] Radiology Department (First Floor).  Please follow these instructions carefully (unless otherwise directed):  Hold all erectile dysfunction medications at least 48 hours prior to test.  On the Night Before the Test: . Be sure to Drink plenty of water. . Do not consume any caffeinated/decaffeinated beverages or chocolate 12 hours prior to your test. . Do not take any antihistamines 12 hours prior to your test. .   On the Day of the Test: . Drink plenty of water. Do not drink any water within one hour of the test. . Do not eat any food 4 hours prior to the test. . You may take your regular medications prior to the test.  . Take metoprolol (Lopressor) two hours prior to test.                   -If HR is less than 55 BPM- No Beta Blocker           After the Test: . Drink plenty of water. . After receiving IV contrast, you may experience a mild flushed feeling. This is normal. . On occasion, you may experience a mild rash up to 24 hours after the test. This is not dangerous. If this occurs, you can take Benadryl 25 mg and increase your fluid intake. . If you experience trouble breathing, this can be serious. If it is severe call 911 IMMEDIATELY. If it is mild, please call our office. . If you take any of these medications: Glipizide/Metformin, Avandament, Glucavance, please do not take 48 hours after completing test.   Follow-Up: At [facility], you and your health needs are our priority.  As part of our continuing mission to provide you with exceptional heart care, we have created designated Provider Care Teams.  These Care Teams include your primary Cardiologist (physician) and Advanced Practice Providers (APPs -  Physician Assistants and Nurse Practitioners) who all work together to provide you with the care you need, when you need it. You will need a follow up appointment in 6 weeks.   Any Other Special Instructions Will Be Listed Below (If Applicable).

## 2018-10-29 DIAGNOSIS — I119 Hypertensive heart disease without heart failure: Secondary | ICD-10-CM | POA: Diagnosis not present

## 2018-10-30 LAB — BASIC METABOLIC PANEL
BUN/Creatinine Ratio: 33 — ABNORMAL HIGH (ref 10–24)
BUN: 27 mg/dL (ref 8–27)
CO2: 22 mmol/L (ref 20–29)
Calcium: 9.2 mg/dL (ref 8.6–10.2)
Chloride: 107 mmol/L — ABNORMAL HIGH (ref 96–106)
Creatinine, Ser: 0.82 mg/dL (ref 0.76–1.27)
GFR calc Af Amer: 102 mL/min/{1.73_m2} (ref >59)
GFR calc non Af Amer: 88 mL/min/{1.73_m2} (ref >59)
Glucose: 88 mg/dL (ref 65–99)
Potassium: 4.3 mmol/L (ref 3.5–5.2)
Sodium: 146 mmol/L — ABNORMAL HIGH (ref 134–144)

## 2018-11-02 ENCOUNTER — Telehealth (HOSPITAL_COMMUNITY): Payer: Self-pay | Admitting: Emergency Medicine

## 2018-11-02 NOTE — Telephone Encounter (Signed)
Left message on voicemail with name and callback number Daisean Brodhead RN Navigator Cardiac Imaging [facility] [phone] Office [phone] Cell  

## 2018-11-03 ENCOUNTER — Other Ambulatory Visit: Payer: Self-pay

## 2018-11-03 ENCOUNTER — Ambulatory Visit (HOSPITAL_COMMUNITY)
Admission: RE | Admit: 2018-11-03 | Discharge: 2018-11-03 | Disposition: A | Discharge status: Home / Self Care | Payer: Medicare Other | Source: Ambulatory Visit | Attending: Cardiology | Admitting: Cardiology

## 2018-11-03 ENCOUNTER — Encounter (HOSPITAL_COMMUNITY): Payer: Self-pay

## 2018-11-03 ENCOUNTER — Telehealth: Payer: Self-pay | Admitting: *Deleted

## 2018-11-03 DIAGNOSIS — R079 Chest pain, unspecified: Secondary | ICD-10-CM | POA: Insufficient documentation

## 2018-11-03 DIAGNOSIS — I700 Atherosclerosis of aorta: Secondary | ICD-10-CM | POA: Diagnosis not present

## 2018-11-03 DIAGNOSIS — R072 Precordial pain: Secondary | ICD-10-CM | POA: Insufficient documentation

## 2018-11-03 IMAGING — CT CT HEAR MORPH WITH CTA COR WITH SCORE WITH CA WITH CONTRAST AND
4 of 7 series · 8 of 20 positions shown, 9 images · IV contrast (APPLIED)
Comparison: 06/06/2016 chest radiograph.  No prior chest CT.
COMPARISON: 06/06/2016 chest radiograph.  No prior chest CT.

Addendum:
EXAM:
OVER-READ INTERPRETATION  CT CHEST

The following report is an over-read performed by radiologist Dr.
Elfigo Seibel [REDACTED] on 11/03/2018. This over-read
does not include interpretation of cardiac or coronary anatomy or
pathology. The coronary CTA interpretation by the cardiologist is
attached.
TECHNIQUE: The patient was scanned on a [employer] Force scanner.

[Series 6: best diast 75 % · axial · 0.39mm/px · z∈[-455,-404]mm · 2 of 382 slices shown, 3 images]
[im 128/382  vessel]
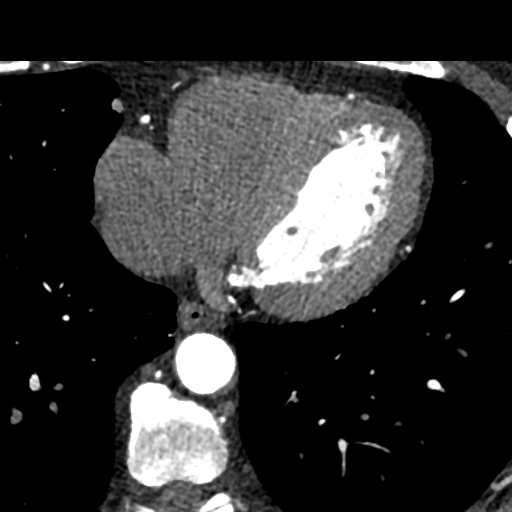
[im 128/382  lung]
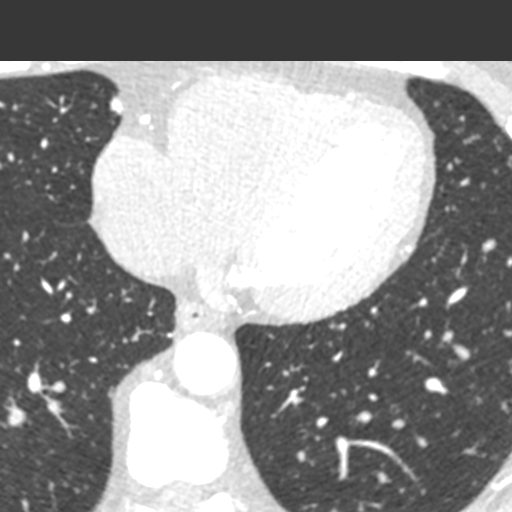
[im 255/382  vessel]
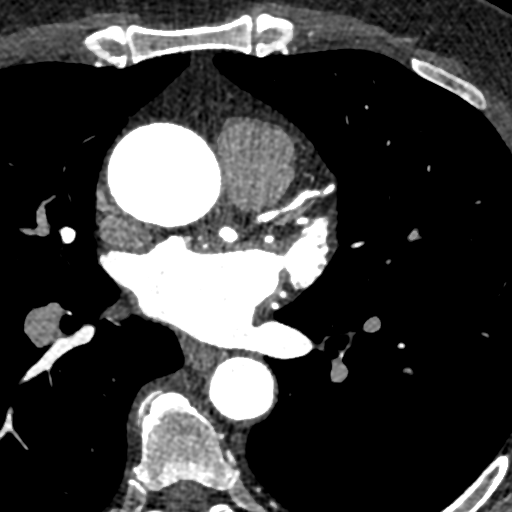

[Series 7: best syst 34 % · axial · 0.39mm/px · z∈[-455,-404]mm · 2 of 382 slices shown]
[im 128/382  vessel]
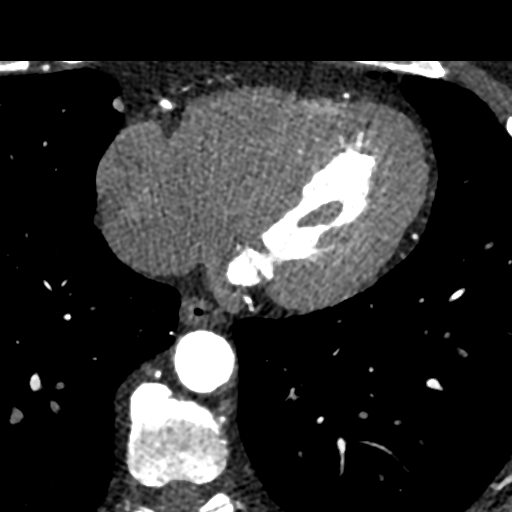
[im 255/382  vessel]
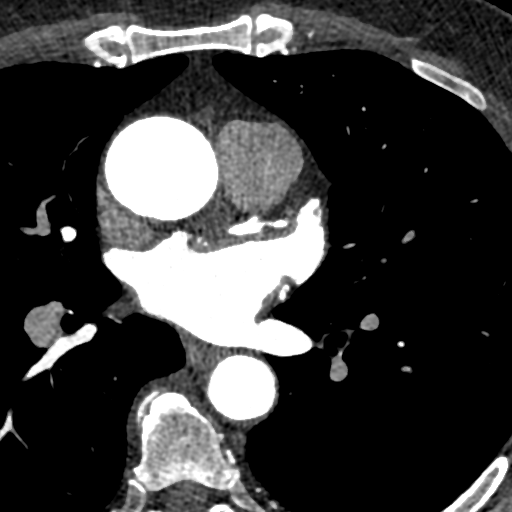

[Series 8: ts diast sharp 75 % · axial · 0.39mm/px · z∈[-455,-404]mm · 2 of 382 slices shown]
[im 128/382  lung]
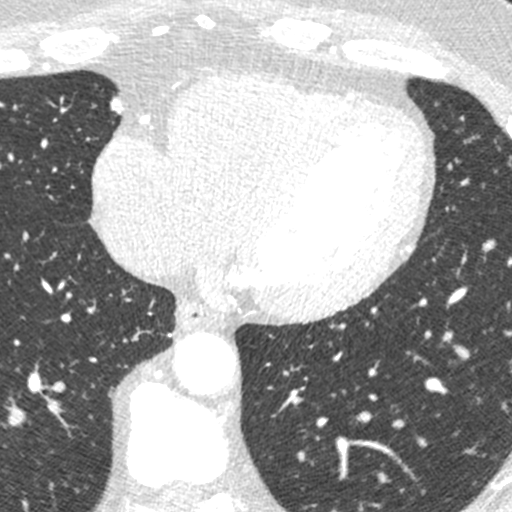
[im 255/382  lung]
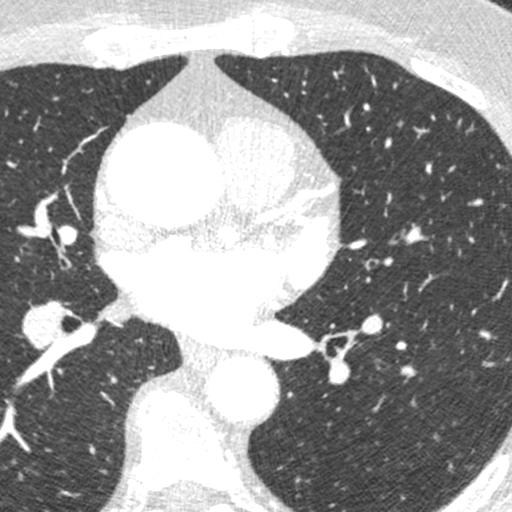

[Series 9: ts syst sharp 34 % · axial · 0.39mm/px · z∈[-455,-404]mm · 2 of 382 slices shown]
[im 128/382  lung]
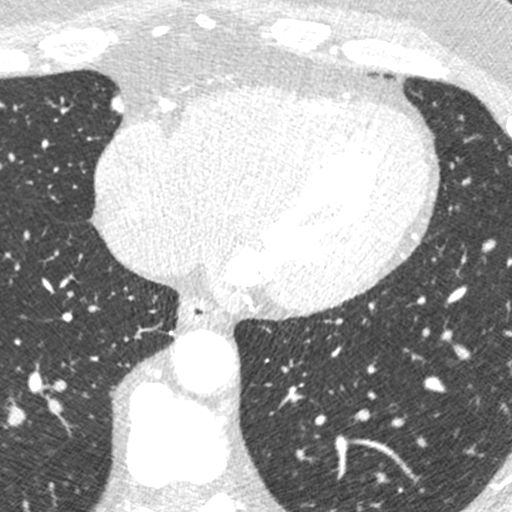
[im 255/382  lung]
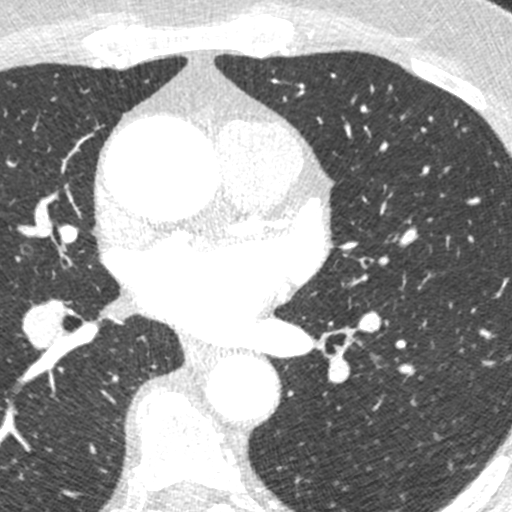

[8 of 20 positions shown; findings below may reference images not displayed]

FINDINGS: Vascular: Aortic atherosclerosis. No evidence of dissection. No
central pulmonary embolism, on this non-dedicated study.

Mediastinum/Nodes: No imaged thoracic adenopathy. Tiny hiatal
hernia. Distal esophageal wall thickening is mild, including on
46/11.

Lungs/Pleura: No pleural fluid. Bibasilar calcified granulomas,
including within the right lower lobe on 39/12 and the left lower
lobe on [DATE].

Upper Abdomen: Normal imaged portions of the liver, spleen, stomach.

Musculoskeletal: Presumed bone island within the posterior medial
left-sided rib. Moderate midthoracic spondylosis.
IMPRESSION: 1.  No acute findings in the imaged extracardiac chest.
2.  Aortic Atherosclerosis (EQDOA-P7I.I).
3. Distal esophageal wall thickening, suspicious for esophagitis.

EXAM:
Cardiac/Coronary  CT
FINDINGS: A 120 kV prospective scan was triggered in the descending thoracic
aorta at 111 HU's. Axial non-contrast 3 mm slices were carried out
through the heart. The data set was analyzed on a dedicated work
station and scored using the Agatson method. Gantry rotation speed
was 250 msecs and collimation was .6 mm. No beta blockade and 0.8 mg
of sl NTG was given. The 3D data set was reconstructed in 5%
intervals of the 67-82 % of the R-R cycle. Diastolic phases were
analyzed on a dedicated work station using MPR, MIP and VRT modes.
The patient received 80 cc of contrast.

Aorta: Dilated ascending aorta with maximum diameter 40mm. Aortic
calcifications. No dissection.

Aortic Valve:  Trileaflet.  Aortic annular calcifications.

Coronary Arteries:  Normal coronary origin.  Right dominance.

RCA is a large dominant artery that gives rise to PDA and PLVB.
There is mild non calcified plaque in the proximal RCA with 25-49%
stenosis. There is a moderate calcified plaque in the mid RCA 50-69%
stenosis.

Left main is a large artery that gives rise to LAD, Ramus and LCX
arteries. There is minimal calcified plaque throughout the LM with 0
to 24% stenosis.

LAD is a large vessel that has no plaque. There is minimal calcified
plaque in the proximal LAD with 0 to 24% stenosis. There is mild
mixed calcified plaque in the mid LAD 25-49% stenosis. There is mild
calcified plaque in the mid diagonal branch of 25-49%.

Ramus is a large artery with no plaque.

LCX is a non-dominant artery that gives rise to one moderate sized
OM1 branch. There is no plaque.

Other findings:

Normal pulmonary vein drainage into the left atrium.

Normal let atrial appendage without a thrombus.

Normal size of the pulmonary artery.
IMPRESSION: 1. Coronary calcium score of 119. This was 63rd percentile for age
and sex matched control.

2. Normal coronary origin with right dominance.

3. There is mild calcified plaque in the mid LAD 25-49% stenosis.

4. There is moderate calcified plaque in the mid RCA 50-69%
stenosis. CAD RADS 3.

5. Recommend aggressive risk factor modification.

6. This study has been sent for FFR analysis.

Zaira Irizarry

*** End of Addendum ***
EXAM:
OVER-READ INTERPRETATION  CT CHEST

The following report is an over-read performed by radiologist Dr.
Elfigo Seibel [REDACTED] on 11/03/2018. This over-read
does not include interpretation of cardiac or coronary anatomy or
pathology. The coronary CTA interpretation by the cardiologist is
attached.
FINDINGS: Vascular: Aortic atherosclerosis. No evidence of dissection. No
central pulmonary embolism, on this non-dedicated study.

Mediastinum/Nodes: No imaged thoracic adenopathy. Tiny hiatal
hernia. Distal esophageal wall thickening is mild, including on
46/11.

Lungs/Pleura: No pleural fluid. Bibasilar calcified granulomas,
including within the right lower lobe on 39/12 and the left lower
lobe on [DATE].

Upper Abdomen: Normal imaged portions of the liver, spleen, stomach.

Musculoskeletal: Presumed bone island within the posterior medial
left-sided rib. Moderate midthoracic spondylosis.
IMPRESSION: 1.  No acute findings in the imaged extracardiac chest.
2.  Aortic Atherosclerosis (EQDOA-P7I.I).
3. Distal esophageal wall thickening, suspicious for esophagitis.

## 2018-11-03 MED ORDER — NITROGLYCERIN 0.4 MG SL SUBL
0.8000 mg | SUBLINGUAL_TABLET | SUBLINGUAL | Status: DC | PRN
  Administered 2018-11-03: 0.8 mg via SUBLINGUAL
  Filled 2018-11-03 (×2): qty 25

## 2018-11-03 MED ORDER — NITROGLYCERIN 0.4 MG SL SUBL
SUBLINGUAL_TABLET | SUBLINGUAL | Status: AC
  Filled 2018-11-03: qty 2

## 2018-11-03 MED ORDER — IOHEXOL 350 MG/ML SOLN
100.0000 mL | Freq: Once | INTRAVENOUS | Status: AC | PRN
  Administered 2018-11-03: 100 mL via INTRAVENOUS

## 2018-11-03 NOTE — Progress Notes (Signed)
Pt tolerated exam without incident.  PIV removed and dressing applied.  Pt provided with caffeinated beverage.  Discharge instructions discussed.  Pt discharged

## 2018-11-03 NOTE — Discharge Instructions (Signed)
Testing With IV Contrast Material °IV contrast material is a fluid that is used with some imaging tests. It is injected into your body through a vein. Contrast material is used when your health care providers need a detailed look at organs, tissues, or blood vessels that may not show up with the standard test. The material may be used when an X-ray, an MRI, a CT scan, or an ultrasound is done. °IV contrast material may be used for imaging tests that check: °· Muscles, skin, and fat. °· Breasts. °· Brain. °· Digestive tract. °· Heart. °· Organs such as the liver, kidneys, lungs, bladder, and many others. °· Arteries and veins. °Tell a health care provider about: °· Any allergies you have, especially an allergy to contrast material. °· All medicines you are taking, including metformin, beta blockers, NSAIDs (such as ibuprofen), interleukin-2, vitamins, herbs, eye drops, creams, and over-the-counter medicines. °· Any problems you or family members have had with the use of contrast material. °· Any blood disorders you have, such as sickle cell anemia. °· Any surgeries you have had. °· Any medical conditions you have or have had, especially alcohol abuse, dehydration, asthma, or kidney, liver, or heart problems. °· Whether you are pregnant or may be pregnant. °· Whether you are breastfeeding. Most contrast materials are safe for use in breastfeeding women. °What are the risks? °Generally, this is a safe procedure. However, problems may occur, including: °· Headache. °· Itching, skin rash, and hives. °· Nausea and vomiting. °· Allergic reactions. °· Wheezing or difficulty breathing. °· Abnormal heart rate. °· Changes in blood pressure. °· Throat swelling. °· Kidney damage. °What happens before the procedure? °Medicines °Ask your health care provider about: °· Changing or stopping your regular medicines. This is especially important if you are taking diabetes medicines or blood thinners. °· Taking medicines such as aspirin  and ibuprofen. These medicines can thin your blood. Do not take these medicines unless your health care provider tells you to take them. °· Taking over-the-counter medicines, vitamins, herbs, and supplements. °If you are at risk of having a reaction to the IV contrast material, you may be asked to take medicine before the procedure to prevent a reaction. °General instructions °· Follow instructions from your health care provider about eating or drinking restrictions. °· You may have an exam or lab tests to make sure that you can safely get IV contrast material. °· Ask if you will be given a medicine to help you relax (sedative) during the procedure. If so, plan to have someone take you home from the hospital or clinic. °What happens during the procedure? °· You may be given a sedative to help you relax. °· An IV will be inserted into one of your veins. °· Contrast material will be injected into your IV. °· You may feel warmth or flushing as the contrast material enters your bloodstream. °· You may have a metallic taste in your mouth for a few minutes. °· The needle may cause some discomfort and bruising. °· After the contrast material is in your body, the imaging test will be done. °The procedure may vary among health care providers and hospitals. °What can I expect after the procedure? °· The IV will be removed. °· You may be taken to a recovery area if sedation medicines were used. Your blood pressure, heart rate, breathing rate, and blood oxygen level will be monitored until you leave the hospital or clinic. °Follow these instructions at home: ° °· Take over-the-counter and   prescription medicines only as told by your health care provider. °? Your health care provider may tell you to not take certain medicines for a couple of days after the procedure. This is especially important if you are taking diabetes medicines. °· If you are told, drink enough fluid to keep your urine pale yellow. This will help to remove  the contrast material out of your body. °· Do not drive for 24 hours if you were given a sedative during your procedure. °· It is up to you to get the results of your procedure. Ask your health care provider, or the department that is doing the procedure, when your results will be ready. °· Keep all follow-up visits as told by your health care provider. This is important. °Contact a health care provider if: °· You have redness, swelling, or pain near your IV site. °Get help right away if: °· You have an abnormal heart rhythm. °· You have trouble breathing. °· You have: °? Chest pain. °? Pain in your back, neck, arm, jaw, or stomach. °? Nausea or sweating. °? Hives or a rash. °· You start shaking and cannot stop. °These symptoms may represent a serious problem that is an emergency. Do not wait to see if the symptoms will go away. Get medical help right away. Call your local emergency services (911 in the [place] Do not drive yourself to the hospital. °Summary °· IV contrast material may be used for imaging tests to help your health care providers see your organs and tissues more clearly. °· Tell your health care provider if you are pregnant or may be pregnant. °· During the procedure, you may feel warmth or flushing as the contrast material enters your bloodstream. °· After the procedure, drink enough fluid to keep your urine pale yellow. °This information is not intended to replace advice given to you by your health care provider. Make sure you discuss any questions you have with your health care provider. °Document Released: 03/05/2009 Document Revised: 06/03/2018 Document Reviewed: 06/03/2018 °Elsevier Patient Education © 2020 [employer] ° ° °Cardiac CT Angiogram ° °A cardiac CT angiogram is a procedure to look at the heart and the area around the heart. It may be done to help find the cause of chest pains or other symptoms of heart disease. During this procedure, a large X-ray machine, called a CT scanner,  takes detailed pictures of the heart and the surrounding area after a dye (contrast material) has been injected into blood vessels in the area. The procedure is also sometimes called a coronary CT angiogram, coronary artery scanning, or CTA. °A cardiac CT angiogram allows the health care provider to see how well blood is flowing to and from the heart. The health care provider will be able to see if there are any problems, such as: °· Blockage or narrowing of the coronary arteries in the heart. °· Fluid around the heart. °· Signs of weakness or disease in the muscles, valves, and tissues of the heart. °Tell a health care provider about: °· Any allergies you have. This is especially important if you have had a previous allergic reaction to contrast dye. °· All medicines you are taking, including vitamins, herbs, eye drops, creams, and over-the-counter medicines. °· Any blood disorders you have. °· Any surgeries you have had. °· Any medical conditions you have. °· Whether you are pregnant or may be pregnant. °· Any anxiety disorders, chronic pain, or other conditions you have that may increase your stress or prevent   you from lying still. °What are the risks? °Generally, this is a safe procedure. However, problems may occur, including: °· Bleeding. °· Infection. °· Allergic reactions to medicines or dyes. °· Damage to other structures or organs. °· Kidney damage from the dye or contrast that is used. °· Increased risk of cancer from radiation exposure. This risk is low. Talk with your health care provider about: °? The risks and benefits of testing. °? How you can receive the lowest dose of radiation. °What happens before the procedure? °· Wear comfortable clothing and remove any jewelry, glasses, dentures, and hearing aids. °· Follow instructions from your health care provider about eating and drinking. This may include: °? For 12 hours before the test -- avoid caffeine. This includes tea, coffee, soda, energy drinks,  and diet pills. Drink plenty of water or other fluids that do not have caffeine in them. Being well-hydrated can prevent complications. °? For 4-6 hours before the test -- stop eating and drinking. The contrast dye can cause nausea, but this is less likely if your stomach is empty. °· Ask your health care provider about changing or stopping your regular medicines. This is especially important if you are taking diabetes medicines, blood thinners, or medicines to treat erectile dysfunction. °What happens during the procedure? °· Hair on your chest may need to be removed so that small sticky patches called electrodes can be placed on your chest. These will transmit information that helps to monitor your heart during the test. °· An IV tube will be inserted into one of your veins. °· You might be given a medicine to control your heart rate during the test. This will help to ensure that good images are obtained. °· You will be asked to lie on an exam table. This table will slide in and out of the CT machine during the procedure. °· Contrast dye will be injected into the IV tube. You might feel warm, or you may get a metallic taste in your mouth. °· You will be given a medicine (nitroglycerin) to relax (dilate) the arteries in your heart. °· The table that you are lying on will move into the CT machine tunnel for the scan. °· The person running the machine will give you instructions while the scans are being done. You may be asked to: °? Keep your arms above your head. °? Hold your breath. °? Stay very still, even if the table is moving. °· When the scanning is complete, you will be moved out of the machine. °· The IV tube will be removed. °The procedure may vary among health care providers and hospitals. °What happens after the procedure? °· You might feel warm, or you may get a metallic taste in your mouth from the contrast dye. °· You may have a headache from the nitroglycerin. °· After the procedure, drink water or  other fluids to wash (flush) the contrast material out of your body. °· Contact a health care provider if you have any symptoms of allergy to the contrast. These symptoms include: °? Shortness of breath. °? Rash or hives. °? A racing heartbeat. °· Most people can return to their normal activities right after the procedure. Ask your health care provider what activities are safe for you. °· It is up to you to get the results of your procedure. Ask your health care provider, or the department that is doing the procedure, when your results will be ready. °Summary °· A cardiac CT angiogram is a procedure to   look at the heart and the area around the heart. It may be done to help find the cause of chest pains or other symptoms of heart disease. °· During this procedure, a large X-ray machine, called a CT scanner, takes detailed pictures of the heart and the surrounding area after a dye (contrast material) has been injected into blood vessels in the area. °· Ask your health care provider about changing or stopping your regular medicines before the procedure. This is especially important if you are taking diabetes medicines, blood thinners, or medicines to treat erectile dysfunction. °· After the procedure, drink water or other fluids to wash (flush) the contrast material out of your body. °This information is not intended to replace advice given to you by your health care provider. Make sure you discuss any questions you have with your health care provider. °Document Released: 02/28/2008 Document Revised: 02/27/2017 Document Reviewed: 02/04/2016 °Elsevier Patient Education © 2020 [employer] ° °

## 2018-11-03 NOTE — Telephone Encounter (Signed)
-----   Message from Richardo Priest, MD sent at 11/03/2018  1:18 PM EDT ----- Normal or stable result  Mildly abnormal I can discuss at the office follow-up

## 2018-11-03 NOTE — Telephone Encounter (Signed)
Telephone call to patient. Informed of mildly abnormal CTa. Dr. Bettina Gavia says he will discuss at next office visit. nO further questions

## 2018-12-05 NOTE — Progress Notes (Signed)
Cardiology Office Note:    Date:  12/07/2018   ID:  Gerald [name], DOB 1946-02-15, MRN [id number]  PCP:  Sandford Craze, NP  Cardiologist:  Norman Herrlich, MD    Referring MD: Sandford Craze, NP    ASSESSMENT:    1. Coronary artery disease of native artery of native heart with stable angina pectoris (HCC)   2. Mixed hyperlipidemia   3. Essential hypertension   4. Enlarged thoracic aorta (HCC)    PLAN:    In order of problems listed above:  1. Stable CAD continue medical treatment beta-blocker add statin and low-dose aspirin.  I will give a prescription for nitroglycerin if needed and to see in my office in 1 year or sooner if he is having frequent angina 2. Start statin he will follow-up labs with his PCP 1 month 3. Stable continue ARB 4. Secondary to hypertension relatively mild we will plan to reimage his aorta in 1 year and the primary treatment of this is antihypertensive therapy beta-blocker ARB   Next appointment: Near   Medication Adjustments/Labs and Tests Ordered: Current medicines are reviewed at length with the patient today.  Concerns regarding medicines are outlined above.  No orders of the defined types were placed in this encounter.  No orders of the defined types were placed in this encounter.   Chief Complaint  Patient presents with  . Follow-up    after cardiac CTA    History of Present Illness:    Gerald [name] is a 72 y.o. male with a hx of hypertension, hyperlipidemia and chest pain last seen 10/18/2018. Compliance with diet, lifestyle and medications: Yes  Good healthcare literacy and I walked him through his cardiac CTA report.  He has thickening of the distal esophagus and has a background history of reflux presently not having symptoms I encouraged him to discuss this with his PCP.  He is not having typical angina we reviewed the benefits of medical therapy and initiate aspirin lipid-lowering therapy and plan to see in the  office in 1 year  Cardiac CTA 11/03/2018: IMPRESSION: 1. Coronary calcium score of 119. This was 63rd percentile for age and sex matched control.  2. Normal coronary origin with right dominance.  3. There is mild calcified plaque in the mid LAD 25-49% stenosis.  4. There is moderate calcified plaque in the mid RCA 50-69% stenosis. CAD RADS 3.  5. Recommend aggressive risk factor modification.  CT FFR analysis demonstrates no hemodynamically significant lesions. FFR is < 0.8 in very distal RCA and LAD that likely represents small vessel disease or normal tapering of distal vessel. Consider aggressive medical management for nonobstructive CAD Past Medical History:  Diagnosis Date  . Back pain, chronic   . Benign localized prostatic hyperplasia with lower urinary tract symptoms (LUTS)   . Bladder outlet obstruction   . Bleeding from urethra in male 10/26/2017   3 times over 7 hours  . Depression   . Diverticulosis of colon   . Elevated PSA   . Fatty liver   . GERD (gastroesophageal reflux disease)   . Hiatal hernia   . History of alcohol abuse    quit 1989  . History of esophageal stricture    2003  S/P  DILATATION  . History of esophagitis   . History of melanoma in situ    06/ 2017  REMOVAL RIGHT CHEEK AREA  . History of sepsis 06/06/2016   admitted for urosepsis , UTI due to pseudomonas-- resolved   .  Hx of adenomatous colonic polyps    2003   TUBULAR ADENOMA  . Hyperlipidemia   . Hypertension   . Mild sleep apnea    2004 mild, per sleep study-- no cpap recommended  (pt denies)    Past Surgical History:  Procedure Laterality Date  . CARDIOVASCULAR STRESS TEST  04-17-2015  dr Tresa Endo   Normal stress nulcear study w/ a small, mild, fixed inferior defect consistent with inferior thinning; no ischemia; normal LV function and wall motion , stress ef 56%  . COLONOSCOPY  last one 12-02-2006  . CYSTOSCOPY WITH URETHRAL DILATATION N/A 10/27/2017   Procedure: CYSTOSCOPY  WITH URETHRAL DILATATION;  Surgeon: Rene Paci, MD;  Location: [facility];  Service: Urology;  Laterality: N/A;  ONLY NEEDS 90 MIN FOR ALL PROCEDURES  . EXCISION LEFT ARM MASS  1988  . MELANOMA EXCISION    . MOHS SURGERY  09/06/2015   right parotidemomasseteric excision w/ complex repair for melanoma in situ  . THULIUM LASER TURP (TRANSURETHRAL RESECTION OF PROSTATE) N/A 07/29/2016   Procedure: Morton Peters LASER TURP (TRANSURETHRAL RESECTION OF PROSTATE)/ TRANSRECTAL ULTRASOUND GUIDED PROSTATE BIOPSY;  Surgeon: Hildred Laser, MD;  Location[facility];  Service: Urology;  Laterality: N/A;  . TONSILLECTOMY AND ADENOIDECTOMY  child  . TRANSTHORACIC ECHOCARDIOGRAM  04/16/2015   moderate LVH,  ef 60-65%, grade 1 diastolic dysfunction/  trivial AR, MR and PR/  mild dilated ascending aorta/  mild TR  . TRANSURETHRAL RESECTION OF PROSTATE N/A 10/27/2017   Procedure: TRANSURETHRAL RESECTION OF THE PROSTATE (TURP);  Surgeon: Rene Paci, MD;  Location: [facility];  Service: Urology;  Laterality: N/A;  . UPPER GASTROINTESTINAL ENDOSCOPY  last one 02-15-2014    Current Medications: Current Meds  Medication Sig  . Ciclopirox 1 % shampoo APP TO SCALP UTD AND PRN  . Glycerin-Polysorbate 80 (REFRESH DRY EYE THERAPY OP) Apply to eye as needed.  Marland Kitchen ketoconazole (NIZORAL) 2 % shampoo Apply 1 application topically as needed for irritation. For eczema  . losartan (COZAAR) 100 MG tablet Take 1 tablet (100 mg total) by mouth daily.  . Magnesium Malate 1250 (141.7 Mg) MG TABS Take 1 tablet by mouth 3 (three) times a week.  . meclizine (ANTIVERT) 25 MG tablet Take 25 mg by mouth 3 (three) times daily as needed for dizziness.  . metoprolol tartrate (LOPRESSOR) 25 MG tablet Take 1 tablet (25 mg total) by mouth 2 (two) times daily.  . Multiple Vitamins-Minerals (MULTIVITAMIN ADULTS 50+ PO) Take 1 tablet by mouth daily.  . Omega-3 Fatty Acids  (FISH OIL) 1200 MG CAPS Take 1 capsule by mouth daily.     Allergies:   Augmentin [amoxicillin-pot clavulanate] and Penicillins   Social History   Socioeconomic History  . Marital status: Married    Spouse name: Not on file  . Number of children: 2  . Years of education: Not on file  . Highest education level: Not on file  Occupational History  . Occupation: Retired  Engineer, production  . Financial resource strain: Not on file  . Food insecurity    Worry: Not on file    Inability: Not on file  . Transportation needs    Medical: Not on file    Non-medical: Not on file  Tobacco Use  . Smoking status: Former Smoker    Packs/day: 2.00    Years: 30.00    Pack years: 60.00    Types: Cigarettes    Quit date: 08/29/2006  Years since quitting: 12.2  . Smokeless tobacco: Never Used  Substance and Sexual Activity  . Alcohol use: No  . Drug use: No  . Sexual activity: Yes  Lifestyle  . Physical activity    Days per week: Not on file    Minutes per session: Not on file  . Stress: Not on file  Relationships  . Social Musician on phone: Not on file    Gets together: Not on file    Attends religious service: Not on file    Active member of club or organization: Not on file    Attends meetings of clubs or organizations: Not on file    Relationship status: Not on file  Other Topics Concern  . Not on file  Social History Narrative   Diet: regular, lots of vegetables-----exercise : none but active   Caffeine use:  5 cups coffee daily           Family History: The patient's family history includes Cancer in his maternal grandmother; Colon cancer in his father; Colon cancer (age of onset: 54) in his maternal grandmother; Coronary artery disease in his father, maternal grandmother, and paternal grandmother; Heart attack in his maternal grandfather and paternal grandfather; Hypertension in his maternal aunt; Melanoma in his mother. There is no history of Prostate cancer,  Stroke, Esophageal cancer, Pancreatic cancer, Rectal cancer, or Stomach cancer. ROS:   Please see the history of present illness.    All other systems reviewed and are negative.  EKGs/Labs/Other Studies Reviewed:    The following studies were reviewed today  Recent Labs: 10/29/2018: BUN 27; Creatinine, Ser 0.82; Potassium 4.3; Sodium 146  Recent Lipid Panel    Component Value Date/Time   CHOL 164 03/17/2017 0914   TRIG 121.0 03/17/2017 0914   HDL 40.40 03/17/2017 0914   CHOLHDL 4 03/17/2017 0914   VLDL 24.2 03/17/2017 0914   LDLCALC 100 (H) 03/17/2017 0914   LDLDIRECT 88.0 09/17/2016 1233    Physical Exam:    VS:  BP 122/70 (BP Location: Right Arm, Patient Position: Sitting, Cuff Size: Normal)   Pulse (!) 52   Ht 5' 6.5" (1.689 m)   Wt 176 lb 12.8 oz (80.2 kg)   SpO2 97%   BMI 28.11 kg/m     Wt Readings from Last 3 Encounters:  12/07/18 176 lb 12.8 oz (80.2 kg)  10/18/18 176 lb 1.9 oz (79.9 kg)  09/21/18 166 lb (75.3 kg)     GEN:  Well nourished, well developed in no acute distress HEENT: Normal NECK: No JVD; No carotid bruits LYMPHATICS: No lymphadenopathy CARDIAC: RRR, no murmurs, rubs, gallops RESPIRATORY:  Clear to auscultation without rales, wheezing or rhonchi  ABDOMEN: Soft, non-tender, non-distended MUSCULOSKELETAL:  No edema; No deformity  SKIN: Warm and dry NEUROLOGIC:  Alert and oriented x 3 PSYCHIATRIC:  Normal affect    Signed, Norman Herrlich, MD  12/07/2018 1:33 PM    [facility]

## 2018-12-07 ENCOUNTER — Encounter: Payer: Self-pay | Admitting: Cardiology

## 2018-12-07 ENCOUNTER — Other Ambulatory Visit: Payer: Self-pay

## 2018-12-07 ENCOUNTER — Ambulatory Visit (INDEPENDENT_AMBULATORY_CARE_PROVIDER_SITE_OTHER): Payer: Medicare Other | Admitting: Cardiology

## 2018-12-07 VITALS — BP 122/70 | HR 52 | Temp 95.7°F | Ht 66.5 in | Wt 176.8 lb

## 2018-12-07 DIAGNOSIS — I25118 Atherosclerotic heart disease of native coronary artery with other forms of angina pectoris: Secondary | ICD-10-CM | POA: Diagnosis not present

## 2018-12-07 DIAGNOSIS — I10 Essential (primary) hypertension: Secondary | ICD-10-CM | POA: Diagnosis not present

## 2018-12-07 DIAGNOSIS — E782 Mixed hyperlipidemia: Secondary | ICD-10-CM | POA: Diagnosis not present

## 2018-12-07 DIAGNOSIS — I7789 Other specified disorders of arteries and arterioles: Secondary | ICD-10-CM

## 2018-12-07 MED ORDER — NITROGLYCERIN 0.4 MG SL SUBL: 0.4000 mg | SUBLINGUAL_TABLET | SUBLINGUAL | 11 refills | Status: DC | PRN

## 2018-12-07 MED ORDER — ASPIRIN EC 81 MG PO TBEC: 81.0000 mg | DELAYED_RELEASE_TABLET | Freq: Every day | ORAL | 3 refills | Status: AC

## 2018-12-07 MED ORDER — ROSUVASTATIN CALCIUM 5 MG PO TABS: 5.0000 mg | ORAL_TABLET | Freq: Every day | ORAL | 3 refills | Status: DC

## 2018-12-07 NOTE — Patient Instructions (Signed)
Medication Instructions:  Your physician has recommended you make the following change in your medication:   START Rosuvastatin 5 mg (one tablet) daily.   START Aspirin 80mg  EC (enteric coated) daily.  START Nitroglycerin as needed for chest pain.    Nitroglycerin directions: Take as needed for chest pain. Please sit down prior to taking this medication as it can decrease your blood pressure. For chest pain, take 1 tablet under the tongue. Wait 5 minutes. If chest pain persists, take a 2nd tablet. Wait 5 minutes. If chest pain persists, place a third tablet under your tongue and call 911 for transportation to an emergency department.  If you need a refill on your cardiac medications before your next appointment, please call your pharmacy.   Lab work: None today If you have labs (blood work) drawn today and your tests are completely normal, you will receive your results only by: Marland Kitchen MyChart Message (if you have MyChart) OR . A paper copy in the mail If you have any lab test that is abnormal or we need to change your treatment, we will call you to review the results.  Testing/Procedures: None today.  Follow-Up: At [facility], you and your health needs are our priority.  As part of our continuing mission to provide you with exceptional heart care, we have created designated Provider Care Teams.  These Care Teams include your primary Cardiologist (physician) and Advanced Practice Providers (APPs -  Physician Assistants and Nurse Practitioners) who all work together to provide you with the care you need, when you need it. You will need a follow up appointment in 1 years.   Gerald Heinz, MD  Any Other Special Instructions Will Be Listed Below (If Applicable).  Nitroglycerin sublingual tablets What is this medicine? NITROGLYCERIN (nye troe GLI ser in) is a type of vasodilator. It relaxes blood vessels, increasing the blood and oxygen supply to your heart. This medicine is used to  relieve chest pain caused by angina. It is also used to prevent chest pain before activities like climbing stairs, going outdoors in cold weather, or sexual activity. This medicine may be used for other purposes; ask your health care provider or pharmacist if you have questions. COMMON BRAND NAME(S): Nitroquick, Nitrostat, Nitrotab What should I tell my health care provider before I take this medicine? They need to know if you have any of these conditions:  anemia  head injury, recent stroke, or bleeding in the brain  liver disease  previous heart attack  an unusual or allergic reaction to nitroglycerin, other medicines, foods, dyes, or preservatives  pregnant or trying to get pregnant  breast-feeding How should I use this medicine? Take this medicine by mouth as needed. At the first sign of an angina attack (chest pain or tightness) place one tablet under your tongue. You can also take this medicine 5 to 10 minutes before an event likely to produce chest pain. Follow the directions on the prescription label. Let the tablet dissolve under the tongue. Do not swallow whole. Replace the dose if you accidentally swallow it. It will help if your mouth is not dry. Saliva around the tablet will help it to dissolve more quickly. Do not eat or drink, smoke or chew tobacco while a tablet is dissolving. If you are not better within 5 minutes after taking ONE dose of nitroglycerin, call 9-1-1 immediately to seek emergency medical care. Do not take more than 3 nitroglycerin tablets over 15 minutes. If you take this medicine often  to relieve symptoms of angina, your doctor or health care professional may provide you with different instructions to manage your symptoms. If symptoms do not go away after following these instructions, it is important to call 9-1-1 immediately. Do not take more than 3 nitroglycerin tablets over 15 minutes. Talk to your pediatrician regarding the use of this medicine in children.  Special care may be needed. Overdosage: If you think you have taken too much of this medicine contact a poison control center or emergency room at once. NOTE: This medicine is only for you. Do not share this medicine with others. What if I miss a dose? This does not apply. This medicine is only used as needed. What may interact with this medicine? Do not take this medicine with any of the following medications:  certain migraine medicines like ergotamine and dihydroergotamine (DHE)  medicines used to treat erectile dysfunction like sildenafil, tadalafil, and vardenafil  riociguat This medicine may also interact with the following medications:  alteplase  aspirin  heparin  medicines for high blood pressure  medicines for mental depression  other medicines used to treat angina  phenothiazines like chlorpromazine, mesoridazine, prochlorperazine, thioridazine This list may not describe all possible interactions. Give your health care provider a list of all the medicines, herbs, non-prescription drugs, or dietary supplements you use. Also tell them if you smoke, drink alcohol, or use illegal drugs. Some items may interact with your medicine. What should I watch for while using this medicine? Tell your doctor or health care professional if you feel your medicine is no longer working. Keep this medicine with you at all times. Sit or lie down when you take your medicine to prevent falling if you feel dizzy or faint after using it. Try to remain calm. This will help you to feel better faster. If you feel dizzy, take several deep breaths and lie down with your feet propped up, or bend forward with your head resting between your knees. You may get drowsy or dizzy. Do not drive, use machinery, or do anything that needs mental alertness until you know how this drug affects you. Do not stand or sit up quickly, especially if you are an older patient. This reduces the risk of dizzy or fainting  spells. Alcohol can make you more drowsy and dizzy. Avoid alcoholic drinks. Do not treat yourself for coughs, colds, or pain while you are taking this medicine without asking your doctor or health care professional for advice. Some ingredients may increase your blood pressure. What side effects may I notice from receiving this medicine? Side effects that you should report to your doctor or health care professional as soon as possible:  blurred vision  dry mouth  skin rash  sweating  the feeling of extreme pressure in the head  unusually weak or tired Side effects that usually do not require medical attention (report to your doctor or health care professional if they continue or are bothersome):  flushing of the face or neck  headache  irregular heartbeat, palpitations  nausea, vomiting This list may not describe all possible side effects. Call your doctor for medical advice about side effects. You may report side effects to FDA at 1-800-FDA-1088. Where should I keep my medicine? Keep out of the reach of children. Store at room temperature between 20 and 25 degrees C (68 and 77 degrees F). Store in Chief of Staff. Protect from light and moisture. Keep tightly closed. Throw away any unused medicine after the expiration date. NOTE: This  sheet is a summary. It may not cover all possible information. If you have questions about this medicine, talk to your doctor, pharmacist, or health care provider.  2020 Elsevier/Gold Standard (2013-01-13 17:57:36) Rosuvastatin Tablets What is this medicine? ROSUVASTATIN (roe SOO va sta tin) is known as a HMG-CoA reductase inhibitor or 'statin'. It lowers cholesterol and triglycerides in the blood. This drug may also reduce the risk of heart attack, stroke, or other health problems in patients with risk factors for heart disease. Diet and lifestyle changes are often used with this drug. This medicine may be used for other purposes; ask your health  care provider or pharmacist if you have questions. COMMON BRAND NAME(S): Crestor What should I tell my health care provider before I take this medicine? They need to know if you have any of these conditions:  diabetes  if you often drink alcohol  history of stroke  kidney disease  liver disease  muscle aches or weakness  thyroid disease  an unusual or allergic reaction to rosuvastatin, other medicines, foods, dyes, or preservatives  pregnant or trying to get pregnant  breast-feeding How should I use this medicine? Take this medicine by mouth with a glass of water. Follow the directions on the prescription label. Do not cut, crush or chew this medicine. You can take this medicine with or without food. Take your doses at regular intervals. Do not take your medicine more often than directed. Talk to your pediatrician regarding the use of this medicine in children. While this drug may be prescribed for children as young as 48 years old for selected conditions, precautions do apply. Overdosage: If you think you have taken too much of this medicine contact a poison control center or emergency room at once. NOTE: This medicine is only for you. Do not share this medicine with others. What if I miss a dose? If you miss a dose, take it as soon as you can. If your next dose is to be taken in less than 12 hours, then do not take the missed dose. Take the next dose at your regular time. Do not take double or extra doses. What may interact with this medicine? Do not take this medicine with any of the following medications:  herbal medicines like red yeast rice This medicine may also interact with the following medications:  alcohol  antacids containing aluminum hydroxide or magnesium hydroxide  cyclosporine  other medicines for high cholesterol  some medicines for HIV infection  warfarin This list may not describe all possible interactions. Give your health care provider a list of  all the medicines, herbs, non-prescription drugs, or dietary supplements you use. Also tell them if you smoke, drink alcohol, or use illegal drugs. Some items may interact with your medicine. What should I watch for while using this medicine? Visit your doctor or health care professional for regular check-ups. You may need regular tests to make sure your liver is working properly. Your health care professional may tell you to stop taking this medicine if you develop muscle problems. If your muscle problems do not go away after stopping this medicine, contact your health care professional. Do not become pregnant while taking this medicine. Women should inform their health care professional if they wish to become pregnant or think they might be pregnant. There is a potential for serious side effects to an unborn child. Talk to your health care professional or pharmacist for more information. Do not breast-feed an infant while taking this medicine. This  medicine may increase blood sugar. Ask your healthcare provider if changes in diet or medicines are needed if you have diabetes. If you are going to need surgery or other procedure, tell your doctor that you are using this medicine. This drug is only part of a total heart-health program. Your doctor or a dietician can suggest a low-cholesterol and low-fat diet to help. Avoid alcohol and smoking, and keep a proper exercise schedule. This medicine may cause a decrease in Co-Enzyme Q-10. You should make sure that you get enough Co-Enzyme Q-10 while you are taking this medicine. Discuss the foods you eat and the vitamins you take with your health care professional. What side effects may I notice from receiving this medicine? Side effects that you should report to your doctor or health care professional as soon as possible:  allergic reactions like skin rash, itching or hives, swelling of the face, lips, or tongue  confusion  joint pain  loss of  memory  redness, blistering, peeling or loosening of the skin, including inside the mouth  signs and symptoms of high blood sugar such as being more thirsty or hungry or having to urinate more than normal. You may also feel very tired or have blurry vision.  signs and symptoms of muscle injury like dark urine; trouble passing urine or change in the amount of urine; unusually weak or tired; muscle pain or side or back pain  yellowing of the eyes or skin Side effects that usually do not require medical attention (report to your doctor or health care professional if they continue or are bothersome):  constipation  diarrhea  dizziness  gas  headache  nausea  stomach pain  trouble sleeping  upset stomach This list may not describe all possible side effects. Call your doctor for medical advice about side effects. You may report side effects to FDA at 1-800-FDA-1088. Where should I keep my medicine? Keep out of the reach of children. Store at room temperature between 20 and 25 degrees C (68 and 77 degrees F). Keep container tightly closed (protect from moisture). Throw away any unused medicine after the expiration date. NOTE: This sheet is a summary. It may not cover all possible information. If you have questions about this medicine, talk to your doctor, pharmacist, or health care provider.  2020 Elsevier/Gold Standard (2018-01-07 08:25:08)

## 2018-12-15 ENCOUNTER — Encounter: Payer: Self-pay | Admitting: Family

## 2018-12-15 DIAGNOSIS — Z23 Encounter for immunization: Secondary | ICD-10-CM | POA: Diagnosis not present

## 2018-12-16 ENCOUNTER — Other Ambulatory Visit: Payer: Self-pay

## 2018-12-16 ENCOUNTER — Other Ambulatory Visit: Payer: Self-pay | Admitting: *Deleted

## 2018-12-16 MED ORDER — LOSARTAN POTASSIUM 100 MG PO TABS: 100.0000 mg | ORAL_TABLET | Freq: Every day | ORAL | 1 refills | Status: DC

## 2018-12-16 MED ORDER — METOPROLOL TARTRATE 25 MG PO TABS: 25.0000 mg | ORAL_TABLET | Freq: Two times a day (BID) | ORAL | 1 refills | Status: DC

## 2018-12-24 ENCOUNTER — Other Ambulatory Visit: Payer: Self-pay

## 2018-12-24 ENCOUNTER — Telehealth: Payer: Self-pay | Admitting: *Deleted

## 2018-12-24 DIAGNOSIS — E785 Hyperlipidemia, unspecified: Secondary | ICD-10-CM

## 2018-12-24 NOTE — Telephone Encounter (Signed)
Gerald [name] / [name] -- pt is scheduled for lab appt Monday morning but I do not see any orders in Epic. Please place appropriate orders?

## 2018-12-24 NOTE — Telephone Encounter (Signed)
Orders entered. Thanks!

## 2018-12-27 ENCOUNTER — Other Ambulatory Visit: Payer: Self-pay

## 2018-12-27 ENCOUNTER — Other Ambulatory Visit (INDEPENDENT_AMBULATORY_CARE_PROVIDER_SITE_OTHER): Payer: Medicare Other

## 2018-12-27 DIAGNOSIS — E785 Hyperlipidemia, unspecified: Secondary | ICD-10-CM

## 2018-12-27 LAB — HEPATIC FUNCTION PANEL
ALT: 24 U/L (ref 0–53)
AST: 16 U/L (ref 0–37)
Albumin: 4 g/dL (ref 3.5–5.2)
Alkaline Phosphatase: 83 U/L (ref 39–117)
Bilirubin, Direct: 0.1 mg/dL (ref 0.0–0.3)
Total Bilirubin: 0.6 mg/dL (ref 0.2–1.2)
Total Protein: 6.4 g/dL (ref 6.0–8.3)

## 2018-12-28 ENCOUNTER — Encounter: Payer: Self-pay | Admitting: Family

## 2018-12-28 ENCOUNTER — Ambulatory Visit (INDEPENDENT_AMBULATORY_CARE_PROVIDER_SITE_OTHER): Payer: Medicare Other | Admitting: Family

## 2018-12-28 ENCOUNTER — Other Ambulatory Visit (INDEPENDENT_AMBULATORY_CARE_PROVIDER_SITE_OTHER): Payer: Medicare Other

## 2018-12-28 ENCOUNTER — Other Ambulatory Visit: Payer: Self-pay

## 2018-12-28 VITALS — BP 111/60 | HR 56 | Temp 97.0°F | Resp 16 | Ht 67.0 in | Wt 176.0 lb

## 2018-12-28 DIAGNOSIS — E785 Hyperlipidemia, unspecified: Secondary | ICD-10-CM

## 2018-12-28 DIAGNOSIS — M1990 Unspecified osteoarthritis, unspecified site: Secondary | ICD-10-CM

## 2018-12-28 DIAGNOSIS — I25118 Atherosclerotic heart disease of native coronary artery with other forms of angina pectoris: Secondary | ICD-10-CM | POA: Diagnosis not present

## 2018-12-28 DIAGNOSIS — I10 Essential (primary) hypertension: Secondary | ICD-10-CM

## 2018-12-28 LAB — LIPID PANEL
Cholesterol: 151 mg/dL (ref 0–200)
HDL: 43.7 mg/dL (ref >39.00)
LDL Cholesterol: 81 mg/dL (ref 0–99)
NonHDL: 107.23
Total CHOL/HDL Ratio: 3
Triglycerides: 130 mg/dL (ref 0.0–149.0)
VLDL: 26 mg/dL (ref 0.0–40.0)

## 2018-12-28 NOTE — Progress Notes (Signed)
Subjective:    Patient ID: Gerald [name], male    DOB: 08-Oct-1945, 73 y.o.   MRN: [id number]  HPI  Patient is a 73 yr old male who presents today for follow up.  Maintained on lopressor 25mg  bid.  HTN-  BP Readings from Last 3 Encounters:  12/28/18 111/60  12/07/18 122/70  11/03/18 122/73   Hyperlipidemia- maintained on crestor 5mg .   Lab Results  Component Value Date   CHOL 164 03/17/2017   HDL 40.40 03/17/2017   LDLCALC 100 (H) 03/17/2017   LDLDIRECT 88.0 09/17/2016   TRIG 121.0 03/17/2017   CHOLHDL 4 03/17/2017   Reports some mild AM joint pains, diffuse. Reports that they resolve after he has been up for little while.   Review of Systems    see HPI  Past Medical History:  Diagnosis Date  . Back pain, chronic   . Benign localized prostatic hyperplasia with lower urinary tract symptoms (LUTS)   . Bladder outlet obstruction   . Bleeding from urethra in male 10/26/2017   3 times over 7 hours  . Depression   . Diverticulosis of colon   . Elevated PSA   . Fatty liver   . GERD (gastroesophageal reflux disease)   . Hiatal hernia   . History of alcohol abuse    quit 1989  . History of esophageal stricture    2003  S/P  DILATATION  . History of esophagitis   . History of melanoma in situ    06/ 2017  REMOVAL RIGHT CHEEK AREA  . History of sepsis 06/06/2016   admitted for urosepsis , UTI due to pseudomonas-- resolved   . Hx of adenomatous colonic polyps    2003   TUBULAR ADENOMA  . Hyperlipidemia   . Hypertension   . Mild sleep apnea    2004 mild, per sleep study-- no cpap recommended  (pt denies)     Social History   Socioeconomic History  . Marital status: Married    Spouse name: Not on file  . Number of children: 2  . Years of education: Not on file  . Highest education level: Not on file  Occupational History  . Occupation: Retired  Engineer, production  . Financial resource strain: Not on file  . Food insecurity    Worry: Not on file   Inability: Not on file  . Transportation needs    Medical: Not on file    Non-medical: Not on file  Tobacco Use  . Smoking status: Former Smoker    Packs/day: 2.00    Years: 30.00    Pack years: 60.00    Types: Cigarettes    Quit date: 08/29/2006    Years since quitting: 12.3  . Smokeless tobacco: Never Used  Substance and Sexual Activity  . Alcohol use: No  . Drug use: No  . Sexual activity: Yes  Lifestyle  . Physical activity    Days per week: Not on file    Minutes per session: Not on file  . Stress: Not on file  Relationships  . Social Musician on phone: Not on file    Gets together: Not on file    Attends religious service: Not on file    Active member of club or organization: Not on file    Attends meetings of clubs or organizations: Not on file    Relationship status: Not on file  . Intimate partner violence    Fear of current or ex  partner: Not on file    Emotionally abused: Not on file    Physically abused: Not on file    Forced sexual activity: Not on file  Other Topics Concern  . Not on file  Social History Narrative   Diet: regular, lots of vegetables-----exercise : none but active   Caffeine use:  5 cups coffee daily          Past Surgical History:  Procedure Laterality Date  . CARDIOVASCULAR STRESS TEST  04-17-2015  dr Tresa Endo   Normal stress nulcear study w/ a small, mild, fixed inferior defect consistent with inferior thinning; no ischemia; normal LV function and wall motion , stress ef 56%  . COLONOSCOPY  last one 12-02-2006  . CYSTOSCOPY WITH URETHRAL DILATATION N/A 10/27/2017   Procedure: CYSTOSCOPY WITH URETHRAL DILATATION;  Surgeon: Rene Paci, MD;  Location: [facility];  Service: Urology;  Laterality: N/A;  ONLY NEEDS 90 MIN FOR ALL PROCEDURES  . EXCISION LEFT ARM MASS  1988  . MELANOMA EXCISION    . MOHS SURGERY  09/06/2015   right parotidemomasseteric excision w/ complex repair for melanoma in situ   . THULIUM LASER TURP (TRANSURETHRAL RESECTION OF PROSTATE) N/A 07/29/2016   Procedure: Morton Peters LASER TURP (TRANSURETHRAL RESECTION OF PROSTATE)/ TRANSRECTAL ULTRASOUND GUIDED PROSTATE BIOPSY;  Surgeon: Hildred Laser, MD;  Location: [facility];  Service: Urology;  Laterality: N/A;  . TONSILLECTOMY AND ADENOIDECTOMY  child  . TRANSTHORACIC ECHOCARDIOGRAM  04/16/2015   moderate LVH,  ef 60-65%, grade 1 diastolic dysfunction/  trivial AR, MR and PR/  mild dilated ascending aorta/  mild TR  . TRANSURETHRAL RESECTION OF PROSTATE N/A 10/27/2017   Procedure: TRANSURETHRAL RESECTION OF THE PROSTATE (TURP);  Surgeon: Rene Paci, MD;  Location: [facility];  Service: Urology;  Laterality: N/A;  . UPPER GASTROINTESTINAL ENDOSCOPY  last one 02-15-2014    Family History  Problem Relation Age of Onset  . Coronary artery disease Father   . Colon cancer Father        ? unknown age dx/died at 38  . Heart attack Paternal Grandfather   . Coronary artery disease Maternal Grandmother   . Colon cancer Maternal Grandmother 67  . Cancer Maternal Grandmother        colon  . Melanoma Mother   . Coronary artery disease Paternal Grandmother   . Heart attack Maternal Grandfather   . Hypertension Maternal Aunt   . Prostate cancer Neg Hx   . Stroke Neg Hx   . Esophageal cancer Neg Hx   . Pancreatic cancer Neg Hx   . Rectal cancer Neg Hx   . Stomach cancer Neg Hx     Allergies  Allergen Reactions  . Augmentin [Amoxicillin-Pot Clavulanate] Diarrhea    *Can take plain Amoxicillin* Has patient had a PCN reaction causing immediate rash, facial/tongue/throat swelling, SOB or lightheadedness with hypotension: No Has patient had a PCN reaction causing severe rash involving mucus membranes or skin necrosis: No Has patient had a PCN reaction that required hospitalization No Has patient had a PCN reaction occurring within the last 10 years: Yes If all of the above  answers are "NO", then may proceed with Cephalosporin use.   Marland Kitchen Penicillins Rash    Childhood reaction *Can take Amoxicillin* Has patient had a PCN reaction causing immediate rash, facial/tongue/throat swelling, SOB or lightheadedness with hypotension: Unknown Has patient had a PCN reaction causing severe rash involving mucus membranes or skin necrosis: Unknown Has  patient had a PCN reaction that required hospitalization: Unknown Has patient had a PCN reaction occurring within the last 10 years: Unknown  If all of the above answers are "NO", then may proceed with Cephalosporin use    Current Outpatient Medications on File Prior to Visit  Medication Sig Dispense Refill  . aspirin EC 81 MG tablet Take 1 tablet (81 mg total) by mouth daily. 90 tablet 3  . Ciclopirox 1 % shampoo APP TO SCALP UTD AND PRN  2  . Glycerin-Polysorbate 80 (REFRESH DRY EYE THERAPY OP) Apply to eye as needed.    Marland Kitchen ketoconazole (NIZORAL) 2 % shampoo Apply 1 application topically as needed for irritation. For eczema    . losartan (COZAAR) 100 MG tablet Take 1 tablet (100 mg total) by mouth daily. 90 tablet 1  . Magnesium Malate 1250 (141.7 Mg) MG TABS Take 1 tablet by mouth 3 (three) times a week.    . meclizine (ANTIVERT) 25 MG tablet Take 25 mg by mouth 3 (three) times daily as needed for dizziness.    . metoprolol tartrate (LOPRESSOR) 25 MG tablet Take 1 tablet (25 mg total) by mouth 2 (two) times daily. 180 tablet 1  . Multiple Vitamins-Minerals (MULTIVITAMIN ADULTS 50+ PO) Take 1 tablet by mouth daily.    . nitroGLYCERIN (NITROSTAT) 0.4 MG SL tablet Place 1 tablet (0.4 mg total) under the tongue every 5 (five) minutes as needed for chest pain. 25 tablet 11  . Omega-3 Fatty Acids (FISH OIL) 1200 MG CAPS Take 1 capsule by mouth daily.    . rosuvastatin (CRESTOR) 5 MG tablet Take 1 tablet (5 mg total) by mouth daily. 90 tablet 3   No current facility-administered medications on file prior to visit.     BP 111/60 (BP  Location: Right Arm, Patient Position: Sitting, Cuff Size: Small)   Pulse (!) 56   Temp (!) 97 F (36.1 C) (Temporal)   Resp 16   Ht 5\' 7"  (1.702 m)   Wt 176 lb (79.8 kg)   SpO2 98%   BMI 27.57 kg/m    Objective:   Physical Exam Constitutional:      General: He is not in acute distress.    Appearance: He is well-developed.  HENT:     Head: Normocephalic and atraumatic.  Cardiovascular:     Rate and Rhythm: Normal rate and regular rhythm.     Heart sounds: No murmur.  Pulmonary:     Effort: Pulmonary effort is normal. No respiratory distress.     Breath sounds: Normal breath sounds. No wheezing or rales.  Skin:    General: Skin is warm and dry.  Neurological:     Mental Status: He is alert and oriented to person, place, and time.  Psychiatric:        Behavior: Behavior normal.        Thought Content: Thought content normal.           Assessment & Plan:  HTN- bp stable on current medication. Continue same.   Hyperlipidemia- tolerating statin. Will add on lipid panel to yesterday's blood draw.  Osteoarthritis- recommended stretching, walking, prn tylenol.

## 2019-02-01 ENCOUNTER — Encounter: Payer: Self-pay | Admitting: Family

## 2019-04-18 ENCOUNTER — Encounter: Payer: Self-pay | Admitting: Family

## 2019-04-26 ENCOUNTER — Ambulatory Visit: Payer: Medicare Other | Admitting: *Deleted

## 2019-04-26 DIAGNOSIS — D0339 Melanoma in situ of other parts of face: Secondary | ICD-10-CM | POA: Diagnosis not present

## 2019-04-26 DIAGNOSIS — L570 Actinic keratosis: Secondary | ICD-10-CM | POA: Diagnosis not present

## 2019-04-26 DIAGNOSIS — L814 Other melanin hyperpigmentation: Secondary | ICD-10-CM | POA: Diagnosis not present

## 2019-04-26 DIAGNOSIS — L918 Other hypertrophic disorders of the skin: Secondary | ICD-10-CM | POA: Diagnosis not present

## 2019-04-26 DIAGNOSIS — D1801 Hemangioma of skin and subcutaneous tissue: Secondary | ICD-10-CM | POA: Diagnosis not present

## 2019-04-26 DIAGNOSIS — L821 Other seborrheic keratosis: Secondary | ICD-10-CM | POA: Diagnosis not present

## 2019-04-26 DIAGNOSIS — Z85820 Personal history of malignant melanoma of skin: Secondary | ICD-10-CM | POA: Diagnosis not present

## 2019-04-26 DIAGNOSIS — D485 Neoplasm of uncertain behavior of skin: Secondary | ICD-10-CM | POA: Diagnosis not present

## 2019-04-26 DIAGNOSIS — L720 Epidermal cyst: Secondary | ICD-10-CM | POA: Diagnosis not present

## 2019-04-27 NOTE — Progress Notes (Signed)
Virtual Visit via Audio Note  I connected with patient on 04/28/19 at  1:45 PM EST by audio enabled telemedicine application and verified that I am speaking with the correct person using two identifiers.   THIS ENCOUNTER IS A VIRTUAL VISIT DUE TO COVID-19 - PATIENT WAS NOT SEEN IN THE OFFICE. PATIENT HAS CONSENTED TO VIRTUAL VISIT / TELEMEDICINE VISIT   Location of patient: home  Location of provider: office  I discussed the limitations of evaluation and management by telemedicine and the availability of in person appointments. The patient expressed understanding and agreed to proceed.   Subjective:   Gerald [name] is a 74 y.o. male who presents for Medicare Annual/Subsequent preventive examination.  Review of Systems:  Home Safety/Smoke Alarms: Feels safe in home. Smoke alarms in place.  Lives w/ wife and son (with CP). Aide comes to help them 8 hrs per week.  Male:   CCS- due 03/2027    PSA-  Lab Results  Component Value Date   PSA 6.28 (H) 09/01/2011   PSA 5.14 (H) 05/14/2011   PSA 4.32 (H) 10/23/2010       Objective:    Vitals: Unable to assess. This visit is enabled though telemedicine due to Covid 19.   Advanced Directives 04/28/2019 04/20/2018 10/27/2017 09/14/2017 04/13/2017 07/29/2016 06/06/2016  Does Patient Have a Medical Advance Directive? Yes Yes Yes Yes Yes Yes Yes  Type of Paramedic of [place];Living will Living will;Healthcare Power of [place];Living will [place];Living will [place];Living will  Does patient want to make changes to medical advance directive? No - Patient declined - No - Patient declined No - Patient declined No - Patient declined No - Patient declined Yes (Inpatient - patient defers changing a medical advance directive at this time)  Copy of [place] in Chart? No - copy requested No - copy requested - - No - copy requested No  - copy requested -    Tobacco Social History   Tobacco Use  Smoking Status Former Smoker  . Packs/day: 2.00  . Years: 30.00  . Pack years: 60.00  . Types: Cigarettes  . Quit date: 08/29/2006  . Years since quitting: 12.6  Smokeless Tobacco Never Used     Counseling given: Not Answered   Clinical Intake: Pain : No/denies pain     Past Medical History:  Diagnosis Date  . Back pain, chronic   . Benign localized prostatic hyperplasia with lower urinary tract symptoms (LUTS)   . Bladder outlet obstruction   . Bleeding from urethra in male 10/26/2017   3 times over 7 hours  . Depression   . Diverticulosis of colon   . Elevated PSA   . Fatty liver   . GERD (gastroesophageal reflux disease)   . Hiatal hernia   . History of alcohol abuse    quit 1989  . History of esophageal stricture    2003  S/P  DILATATION  . History of esophagitis   . History of melanoma in situ    06/ 2017  REMOVAL RIGHT CHEEK AREA  . History of sepsis 06/06/2016   admitted for urosepsis , UTI due to pseudomonas-- resolved   . Hx of adenomatous colonic polyps    2003   TUBULAR ADENOMA  . Hyperlipidemia   . Hypertension   . Mild sleep apnea    2004 mild, per sleep study-- no cpap recommended  (pt denies)  Past Surgical History:  Procedure Laterality Date  . CARDIOVASCULAR STRESS TEST  04-17-2015  dr Claiborne Billings   Normal stress nulcear study w/ a small, mild, fixed inferior defect consistent with inferior thinning; no ischemia; normal LV function and wall motion , stress ef 56%  . COLONOSCOPY  last one 12-02-2006  . CYSTOSCOPY WITH URETHRAL DILATATION N/A 10/27/2017   Procedure: CYSTOSCOPY WITH URETHRAL DILATATION;  Surgeon: Ceasar Mons, MD;  Location: [facility];  Service: Urology;  Laterality: N/A;  ONLY NEEDS 90 MIN FOR ALL PROCEDURES  . EXCISION LEFT ARM MASS  1988  . MELANOMA EXCISION    . MOHS SURGERY  09/06/2015   right parotidemomasseteric excision w/ complex  repair for melanoma in situ  . THULIUM LASER TURP (TRANSURETHRAL RESECTION OF PROSTATE) N/A 07/29/2016   Procedure: Marcelino Duster LASER TURP (TRANSURETHRAL RESECTION OF PROSTATE)/ TRANSRECTAL ULTRASOUND GUIDED PROSTATE BIOPSY;  Surgeon: Nickie Retort, MD;  Location: [facility];  Service: Urology;  Laterality: N/A;  . TONSILLECTOMY AND ADENOIDECTOMY  child  . TRANSTHORACIC ECHOCARDIOGRAM  04/16/2015   moderate LVH,  ef [id number], grade 1 diastolic dysfunction/  trivial AR, MR and PR/  mild dilated ascending aorta/  mild TR  . TRANSURETHRAL RESECTION OF PROSTATE N/A 10/27/2017   Procedure: TRANSURETHRAL RESECTION OF THE PROSTATE (TURP);  Surgeon: Ceasar Mons, MD;  Location: [facility];  Service: Urology;  Laterality: N/A;  . UPPER GASTROINTESTINAL ENDOSCOPY  last one 02-15-2014   Family History  Problem Relation Age of Onset  . Coronary artery disease Father   . Colon cancer Father        ? unknown age dx/died at 35  . Heart attack Paternal Grandfather   . Coronary artery disease Maternal Grandmother   . Colon cancer Maternal Grandmother 74  . Cancer Maternal Grandmother        colon  . Melanoma Mother   . Coronary artery disease Paternal Grandmother   . Heart attack Maternal Grandfather   . Hypertension Maternal Aunt   . Prostate cancer Neg Hx   . Stroke Neg Hx   . Esophageal cancer Neg Hx   . Pancreatic cancer Neg Hx   . Rectal cancer Neg Hx   . Stomach cancer Neg Hx    Social History   Socioeconomic History  . Marital status: Married    Spouse name: Not on file  . Number of children: 2  . Years of education: Not on file  . Highest education level: Not on file  Occupational History  . Occupation: Retired  Tobacco Use  . Smoking status: Former Smoker    Packs/day: 2.00    Years: 30.00    Pack years: 60.00    Types: Cigarettes    Quit date: 08/29/2006    Years since quitting: 12.6  . Smokeless tobacco: Never Used  Substance and  Sexual Activity  . Alcohol use: No  . Drug use: No  . Sexual activity: Yes  Other Topics Concern  . Not on file  Social History Narrative   Diet: regular, lots of vegetables-----exercise : none but active   Caffeine use:  5 cups coffee daily         Social Determinants of Health   Financial Resource Strain:   . Difficulty of Paying Living Expenses: Not on file  Food Insecurity:   . Worried About Charity fundraiser in the Last Year: Not on file  . Ran Out of Food in the Last Year:  Not on file  Transportation Needs:   . Lack of Transportation (Medical): Not on file  . Lack of Transportation (Non-Medical): Not on file  Physical Activity:   . Days of Exercise per Week: Not on file  . Minutes of Exercise per Session: Not on file  Stress:   . Feeling of Stress : Not on file  Social Connections:   . Frequency of Communication with Friends and Family: Not on file  . Frequency of Social Gatherings with Friends and Family: Not on file  . Attends Religious Services: Not on file  . Active Member of Clubs or Organizations: Not on file  . Attends Archivist Meetings: Not on file  . Marital Status: Not on file    Outpatient Encounter Medications as of 04/28/2019  Medication Sig  . aspirin EC 81 MG tablet Take 1 tablet (81 mg total) by mouth daily.  . Ciclopirox 1 % shampoo APP TO SCALP UTD AND PRN  . Glycerin-Polysorbate 80 (REFRESH DRY EYE THERAPY OP) Apply to eye as needed.  Marland Kitchen ketoconazole (NIZORAL) 2 % shampoo Apply 1 application topically as needed for irritation. For eczema  . losartan (COZAAR) 100 MG tablet Take 1 tablet (100 mg total) by mouth daily.  . meclizine (ANTIVERT) 25 MG tablet Take 25 mg by mouth 3 (three) times daily as needed for dizziness.  . metoprolol tartrate (LOPRESSOR) 25 MG tablet Take 1 tablet (25 mg total) by mouth 2 (two) times daily.  . Multiple Vitamins-Minerals (MULTIVITAMIN ADULTS 50+ PO) Take 1 tablet by mouth daily.  . Omega-3 Fatty Acids  (FISH OIL) 1200 MG CAPS Take 1 capsule by mouth daily.  . Magnesium Malate 1250 (141.7 Mg) MG TABS Take 1 tablet by mouth 3 (three) times a week.  . nitroGLYCERIN (NITROSTAT) 0.4 MG SL tablet Place 1 tablet (0.4 mg total) under the tongue every 5 (five) minutes as needed for chest pain.  . rosuvastatin (CRESTOR) 5 MG tablet Take 1 tablet (5 mg total) by mouth daily.   No facility-administered encounter medications on file as of 04/28/2019.    Activities of Daily Living In your present state of health, do you have any difficulty performing the following activities: 04/28/2019  Hearing? N  Vision? N  Difficulty concentrating or making decisions? N  Walking or climbing stairs? N  Dressing or bathing? N  Doing errands, shopping? N  Preparing Food and eating ? N  Using the Toilet? N  In the past six months, have you accidently leaked urine? N  Do you have problems with loss of bowel control? N  Managing your Medications? N  Managing your Finances? N  Housekeeping or managing your Housekeeping? N  Some recent data might be hidden    Patient Care Team: Debbrah Alar, NP as PCP - General (Internal Medicine)   Assessment:   This is a routine wellness examination for Algot. Physical assessment deferred to PCP.  Exercise Activities and Dietary recommendations Current Exercise Habits: The patient does not participate in regular exercise at present, Exercise limited by: None identified   Diet (meal preparation, eat out, water intake, caffeinated beverages, dairy products, fruits and vegetables): 24 hr recall Breakfast: bacon and grits Lunch: collards and pintos Dinner: BBQ, baked beans, slaw, brownie.  Goals    . Increase physical activity     Walk 79min 3 days/ week    . Weight (lb) < 175 lb (79.4 kg)       Fall Risk Fall Risk  04/28/2019 04/20/2018 04/13/2017  04/04/2016 09/26/2015  Falls in the past year? 0 0 No No No  Number falls in past yr: 0 - - - -  Injury with Fall? 0 -  - - -  Follow up Education provided;Falls prevention discussed - - - -     Depression Screen PHQ 2/9 Scores 04/28/2019 04/20/2018 04/13/2017 04/04/2016  PHQ - 2 Score 0 0 0 0    Cognitive Function Ad8 score reviewed for issues:  Issues making decisions:no  Less interest in hobbies / activities:no  Repeats questions, stories (family complaining):no  Trouble using ordinary gadgets (microwave, computer, phone):no  Forgets the month or year: no  Mismanaging finances: no  Remembering appts:no  Daily problems with thinking and/or memory:no Ad8 score is=0     MMSE - Mini Mental State Exam 04/13/2017 04/04/2016  Orientation to time 5 5  Orientation to Place 5 5  Registration 3 3  Attention/ Calculation 5 5  Recall 3 2  Language- name 2 objects 2 2  Language- repeat 1 1  Language- follow 3 step command 3 3  Language- read & follow direction 1 1  Write a sentence 1 1  Copy design 1 1  Total score 30 29        Immunization History  Administered Date(s) Administered  . Influenza Split 12/30/2010, 01/06/2012, 02/03/2017  . Influenza Whole 02/18/2007, 03/13/2009, 01/31/2010  . Influenza, High Dose Seasonal PF 01/29/2017, 12/29/2017, 12/14/2018  . Influenza-Unspecified 01/29/2013, 12/29/2013, 01/15/2016  . Pneumococcal Conjugate-13 03/15/2014  . Pneumococcal Polysaccharide-23 05/29/2005, 01/06/2012  . Td 04/20/2001, 03/15/2014  . Zoster 08/27/2011    Screening Tests Health Maintenance  Topic Date Due  . TETANUS/TDAP  03/15/2024  . COLONOSCOPY  03/28/2027  . INFLUENZA VACCINE  Completed  . Hepatitis C Screening  Completed  . PNA vac Low Risk Adult  Completed       Plan:   See you next year!  Continue to eat heart healthy diet (full of fruits, vegetables, whole grains, lean protein, water--limit salt, fat, and sugar intake) and increase physical activity as tolerated.  Continue doing brain stimulating activities (puzzles, reading, adult coloring books, staying  active) to keep memory sharp.   Bring a copy of your living will and/or healthcare power of attorney to your next office visit.    I have personally reviewed and noted the following in the patient's chart:   . Medical and social history . Use of alcohol, tobacco or illicit drugs  . Current medications and supplements . Functional ability and status . Nutritional status . Physical activity . Advanced directives . List of other physicians . Hospitalizations, surgeries, and ER visits in previous 12 months . Vitals . Screenings to include cognitive, depression, and falls . Referrals and appointments  In addition, I have reviewed and discussed with patient certain preventive protocols, quality metrics, and best practice recommendations. A written personalized care plan for preventive services as well as general preventive health recommendations were provided to patient.     Shela Nevin, [place]  04/28/2019

## 2019-04-28 ENCOUNTER — Other Ambulatory Visit: Payer: Self-pay

## 2019-04-28 ENCOUNTER — Ambulatory Visit (INDEPENDENT_AMBULATORY_CARE_PROVIDER_SITE_OTHER): Payer: Medicare Other | Admitting: *Deleted

## 2019-04-28 ENCOUNTER — Encounter: Payer: Self-pay | Admitting: *Deleted

## 2019-04-28 DIAGNOSIS — Z0000 Encounter for general adult medical examination without abnormal findings: Secondary | ICD-10-CM | POA: Diagnosis not present

## 2019-04-28 NOTE — Patient Instructions (Signed)
See you next year!  Continue to eat heart healthy diet (full of fruits, vegetables, whole grains, lean protein, water--limit salt, fat, and sugar intake) and increase physical activity as tolerated.  Continue doing brain stimulating activities (puzzles, reading, adult coloring books, staying active) to keep memory sharp.   Bring a copy of your living will and/or healthcare power of attorney to your next office visit.    [name] , Thank you for taking time to come for your Medicare Wellness Visit. I appreciate your ongoing commitment to your health goals. Please review the following plan we discussed and let me know if I can assist you in the future.   These are the goals we discussed: Goals    . Increase physical activity     Walk 39mn 3 days/ week    . Weight (lb) < 175 lb (79.4 kg)       This is a list of the screening recommended for you and due dates:  Health Maintenance  Topic Date Due  . Tetanus Vaccine  03/15/2024  . Colon Cancer Screening  03/28/2027  . Flu Shot  Completed  .  Hepatitis C: One time screening is recommended by Center for Disease Control  (CDC) for  adults born from [birth year]through 1964.   Completed  . Pneumonia vaccines  Completed    Preventive Care 635Years and Older, Male Preventive care refers to lifestyle choices and visits with your health care provider that can promote health and wellness. This includes:  A yearly physical exam. This is also called an annual well check.  Regular dental and eye exams.  Immunizations.  Screening for certain conditions.  Healthy lifestyle choices, such as diet and exercise. What can I expect for my preventive care visit? Physical exam Your health care provider will check:  Height and weight. These may be used to calculate body mass index (BMI), which is a measurement that tells if you are at a healthy weight.  Heart rate and blood pressure.  Your skin for abnormal spots. Counseling Your health care  provider may ask you questions about:  Alcohol, tobacco, and drug use.  Emotional well-being.  Home and relationship well-being.  Sexual activity.  Eating habits.  History of falls.  Memory and ability to understand (cognition).  Work and work eStatistician What immunizations do I need?  Influenza (flu) vaccine  This is recommended every year. Tetanus, diphtheria, and pertussis (Tdap) vaccine  You may need a Td booster every 10 years. Varicella (chickenpox) vaccine  You may need this vaccine if you have not already been vaccinated. Zoster (shingles) vaccine  You may need this after age 73 Pneumococcal conjugate (PCV13) vaccine  One dose is recommended after age 73 Pneumococcal polysaccharide (PPSV23) vaccine  One dose is recommended after age 73 Measles, mumps, and rubella (MMR) vaccine  You may need at least one dose of MMR if you were born in 1957 or later. You may also need a second dose. Meningococcal conjugate (MenACWY) vaccine  You may need this if you have certain conditions. Hepatitis A vaccine  You may need this if you have certain conditions or if you travel or work in places where you may be exposed to hepatitis A. Hepatitis B vaccine  You may need this if you have certain conditions or if you travel or work in places where you may be exposed to hepatitis B. Haemophilus influenzae type b (Hib) vaccine  You may need this if you have certain conditions. You may  receive vaccines as individual doses or as more than one vaccine together in one shot (combination vaccines). Talk with your health care provider about the risks and benefits of combination vaccines. What tests do I need? Blood tests  Lipid and cholesterol levels. These may be checked every 5 years, or more frequently depending on your overall health.  Hepatitis C test.  Hepatitis B test. Screening  Lung cancer screening. You may have this screening every year starting at age 75 if you  have a 30-pack-year history of smoking and currently smoke or have quit within the past 15 years.  Colorectal cancer screening. All adults should have this screening starting at age 23 and continuing until age 72. Your health care provider may recommend screening at age 43 if you are at increased risk. You will have tests every 1-10 years, depending on your results and the type of screening test.  Prostate cancer screening. Recommendations will vary depending on your family history and other risks.  Diabetes screening. This is done by checking your blood sugar (glucose) after you have not eaten for a while (fasting). You may have this done every 1-3 years.  Abdominal aortic aneurysm (AAA) screening. You may need this if you are a current or former smoker.  Sexually transmitted disease (STD) testing. Follow these instructions at home: Eating and drinking  Eat a diet that includes fresh fruits and vegetables, whole grains, lean protein, and low-fat dairy products. Limit your intake of foods with high amounts of sugar, saturated fats, and salt.  Take vitamin and mineral supplements as recommended by your health care provider.  Do not drink alcohol if your health care provider tells you not to drink.  If you drink alcohol: ? Limit how much you have to 0-2 drinks a day. ? Be aware of how much alcohol is in your drink. In the [place] one drink equals one 12 oz bottle of beer (355 mL), one 5 oz glass of wine (148 mL), or one 1 oz glass of hard liquor (44 mL). Lifestyle  Take daily care of your teeth and gums.  Stay active. Exercise for at least 30 minutes on 5 or more days each week.  Do not use any products that contain nicotine or tobacco, such as cigarettes, e-cigarettes, and chewing tobacco. If you need help quitting, ask your health care provider.  If you are sexually active, practice safe sex. Use a condom or other form of protection to prevent STIs (sexually transmitted  infections).  Talk with your health care provider about taking a low-dose aspirin or statin. What's next?  Visit your health care provider once a year for a well check visit.  Ask your health care provider how often you should have your eyes and teeth checked.  Stay up to date on all vaccines. This information is not intended to replace advice given to you by your health care provider. Make sure you discuss any questions you have with your health care provider. Document Revised: 03/11/2018 Document Reviewed: 03/11/2018 Elsevier Patient Education  2020 [employer].

## 2019-05-09 DIAGNOSIS — L7682 Other postprocedural complications of skin and subcutaneous tissue: Secondary | ICD-10-CM | POA: Diagnosis not present

## 2019-05-09 DIAGNOSIS — D0339 Melanoma in situ of other parts of face: Secondary | ICD-10-CM | POA: Diagnosis not present

## 2019-05-09 DIAGNOSIS — D0330 Melanoma in situ of unspecified part of face: Secondary | ICD-10-CM | POA: Diagnosis not present

## 2019-05-10 ENCOUNTER — Encounter: Payer: Self-pay | Admitting: Family

## 2019-05-10 DIAGNOSIS — D0339 Melanoma in situ of other parts of face: Secondary | ICD-10-CM | POA: Diagnosis not present

## 2019-05-14 ENCOUNTER — Ambulatory Visit: Payer: Medicare Other | Attending: Internal Medicine

## 2019-05-14 DIAGNOSIS — Z23 Encounter for immunization: Secondary | ICD-10-CM | POA: Insufficient documentation

## 2019-05-14 NOTE — Progress Notes (Signed)
Covid-19 Vaccination Clinic  Name:  PHILL MATHWIG    MRN: [id number] DOB: 1945/12/30  05/14/2019  Mr. Macken was observed post Covid-19 immunization for 15 minutes without incidence. He was provided with Vaccine Information Sheet and instruction to access the V-Safe system.   Mr. Bedoya was instructed to call 911 with any severe reactions post vaccine: Marland Kitchen Difficulty breathing  . Swelling of your face and throat  . A fast heartbeat  . A bad rash all over your body  . Dizziness and weakness    Immunizations Administered    Name Date Dose VIS Date Route   Pfizer COVID-19 Vaccine 05/14/2019  2:34 PM 0.3 mL 03/11/2019 Intramuscular   Manufacturer: [employer], [employer]   Lot: [id number]   NDC: [address]-2

## 2019-05-24 DIAGNOSIS — H5361 Abnormal dark adaptation curve: Secondary | ICD-10-CM | POA: Diagnosis not present

## 2019-05-24 DIAGNOSIS — H2513 Age-related nuclear cataract, bilateral: Secondary | ICD-10-CM | POA: Diagnosis not present

## 2019-05-24 DIAGNOSIS — H02889 Meibomian gland dysfunction of unspecified eye, unspecified eyelid: Secondary | ICD-10-CM | POA: Diagnosis not present

## 2019-06-06 ENCOUNTER — Ambulatory Visit: Payer: Medicare Other | Attending: Internal Medicine

## 2019-06-06 DIAGNOSIS — Z23 Encounter for immunization: Secondary | ICD-10-CM | POA: Insufficient documentation

## 2019-06-06 NOTE — Progress Notes (Signed)
Covid-19 Vaccination Clinic  Name:  Gerald [name]    MRN: [id number] DOB: 1945/11/17  06/06/2019  Gerald [name] was observed post Covid-19 immunization for 15 minutes without incident. He was provided with Vaccine Information Sheet and instruction to access the V-Safe system.   Gerald [name] was instructed to call 911 with any severe reactions post vaccine: Marland Kitchen Difficulty breathing  . Swelling of face and throat  . A fast heartbeat  . A bad rash all over body  . Dizziness and weakness   Immunizations Administered    Name Date Dose VIS Date Route   Pfizer COVID-19 Vaccine 06/06/2019  1:08 PM 0.3 mL 03/11/2019 Intramuscular   Manufacturer: [employer], [employer]   Lot: [id number]   NDC: [address]-6

## 2019-06-11 ENCOUNTER — Other Ambulatory Visit: Payer: Self-pay | Admitting: Family

## 2019-06-28 ENCOUNTER — Encounter: Payer: Self-pay | Admitting: Family

## 2019-06-28 ENCOUNTER — Ambulatory Visit (INDEPENDENT_AMBULATORY_CARE_PROVIDER_SITE_OTHER): Payer: Medicare Other | Admitting: Family

## 2019-06-28 ENCOUNTER — Other Ambulatory Visit: Payer: Self-pay

## 2019-06-28 VITALS — BP 130/63 | HR 51 | Temp 97.4°F | Resp 16 | Wt 180.0 lb

## 2019-06-28 DIAGNOSIS — E785 Hyperlipidemia, unspecified: Secondary | ICD-10-CM

## 2019-06-28 DIAGNOSIS — I10 Essential (primary) hypertension: Secondary | ICD-10-CM | POA: Diagnosis not present

## 2019-06-28 LAB — COMPREHENSIVE METABOLIC PANEL
ALT: 22 U/L (ref 0–53)
AST: 18 U/L (ref 0–37)
Albumin: 3.9 g/dL (ref 3.5–5.2)
Alkaline Phosphatase: 74 U/L (ref 39–117)
BUN: 20 mg/dL (ref 6–23)
CO2: 30 mEq/L (ref 19–32)
Calcium: 9 mg/dL (ref 8.4–10.5)
Chloride: 107 mEq/L (ref 96–112)
Creatinine, Ser: 0.89 mg/dL (ref 0.40–1.50)
GFR: 83.84 mL/min (ref >60.00)
Glucose, Bld: 85 mg/dL (ref 70–99)
Potassium: 4.3 mEq/L (ref 3.5–5.1)
Sodium: 140 mEq/L (ref 135–145)
Total Bilirubin: 0.6 mg/dL (ref 0.2–1.2)
Total Protein: 6 g/dL (ref 6.0–8.3)

## 2019-06-28 MED ORDER — LOSARTAN POTASSIUM 100 MG PO TABS: 100.0000 mg | ORAL_TABLET | Freq: Every day | ORAL | 1 refills | Status: DC

## 2019-06-28 MED ORDER — METOPROLOL TARTRATE 25 MG PO TABS: 25.0000 mg | ORAL_TABLET | Freq: Two times a day (BID) | ORAL | 1 refills | Status: DC

## 2019-06-28 NOTE — Progress Notes (Signed)
9

## 2019-06-28 NOTE — Progress Notes (Signed)
Subjective:    Patient ID: Gerald [name], male    DOB: [birth year], 74 y.o.   MRN: [id number]  HPI  Patient is a 74 yr old male who presents today for follow up.  HTN- maintained on metoprolol, losartan. Denies CP/SOB/swelling BP Readings from Last 3 Encounters:  06/28/19 130/63  12/28/18 111/60  12/07/18 122/70   Hyperlipidemia- maintained on crestor.   Lab Results  Component Value Date   CHOL 151 12/28/2018   HDL 43.70 12/28/2018   LDLCALC 81 12/28/2018   LDLDIRECT 88.0 09/17/2016   TRIG 130.0 12/28/2018   CHOLHDL 3 12/28/2018      Review of Systems See HPI  Past Medical History:  Diagnosis Date  . Back pain, chronic   . Benign localized prostatic hyperplasia with lower urinary tract symptoms (LUTS)   . Bladder outlet obstruction   . Bleeding from urethra in male 10/26/2017   3 times over 7 hours  . Depression   . Diverticulosis of colon   . Elevated PSA   . Fatty liver   . GERD (gastroesophageal reflux disease)   . Hiatal hernia   . History of alcohol abuse    quit 1989  . History of esophageal stricture    2003  S/P  DILATATION  . History of esophagitis   . History of melanoma in situ    06/ 2017  REMOVAL RIGHT CHEEK AREA  . History of sepsis 06/06/2016   admitted for urosepsis , UTI due to pseudomonas-- resolved   . Hx of adenomatous colonic polyps    2003   TUBULAR ADENOMA  . Hyperlipidemia   . Hypertension   . Mild sleep apnea    2004 mild, per sleep study-- no cpap recommended  (pt denies)     Social History   Socioeconomic History  . Marital status: Married    Spouse name: Not on file  . Number of children: 2  . Years of education: Not on file  . Highest education level: Not on file  Occupational History  . Occupation: Retired  Tobacco Use  . Smoking status: Former Smoker    Packs/day: 2.00    Years: 30.00    Pack years: 60.00    Types: Cigarettes    Quit date: 08/29/2006    Years since quitting: 12.8  . Smokeless tobacco:  Never Used  Substance and Sexual Activity  . Alcohol use: No  . Drug use: No  . Sexual activity: Yes  Other Topics Concern  . Not on file  Social History Narrative   Diet: regular, lots of vegetables-----exercise : none but active   Caffeine use:  5 cups coffee daily         Social Determinants of Health   Financial Resource Strain: Low Risk   . Difficulty of Paying Living Expenses: Not hard at all  Food Insecurity: No Food Insecurity  . Worried About Programme researcher, broadcasting/film/video in the Last Year: Never true  . Ran Out of Food in the Last Year: Never true  Transportation Needs: No Transportation Needs  . Lack of Transportation (Medical): No  . Lack of Transportation (Non-Medical): No  Physical Activity:   . Days of Exercise per Week:   . Minutes of Exercise per Session:   Stress:   . Feeling of Stress :   Social Connections:   . Frequency of Communication with Friends and Family:   . Frequency of Social Gatherings with Friends and Family:   . Attends Religious Services:   .  Active Member of Clubs or Organizations:   . Attends Banker Meetings:   Marland Kitchen Marital Status:   Intimate Partner Violence:   . Fear of Current or Ex-Partner:   . Emotionally Abused:   Marland Kitchen Physically Abused:   . Sexually Abused:     Past Surgical History:  Procedure Laterality Date  . CARDIOVASCULAR STRESS TEST  04-17-2015  dr Tresa Endo   Normal stress nulcear study w/ a small, mild, fixed inferior defect consistent with inferior thinning; no ischemia; normal LV function and wall motion , stress ef 56%  . COLONOSCOPY  last one 12-02-2006  . CYSTOSCOPY WITH URETHRAL DILATATION N/A 10/27/2017   Procedure: CYSTOSCOPY WITH URETHRAL DILATATION;  Surgeon: Rene Paci, MD;  Location: [facility];  Service: Urology;  Laterality: N/A;  ONLY NEEDS 90 MIN FOR ALL PROCEDURES  . EXCISION LEFT ARM MASS  1988  . MELANOMA EXCISION    . MOHS SURGERY  09/06/2015   right parotidemomasseteric  excision w/ complex repair for melanoma in situ  . THULIUM LASER TURP (TRANSURETHRAL RESECTION OF PROSTATE) N/A 07/29/2016   Procedure: Morton Peters LASER TURP (TRANSURETHRAL RESECTION OF PROSTATE)/ TRANSRECTAL ULTRASOUND GUIDED PROSTATE BIOPSY;  Surgeon: Hildred Laser, MD;  Location: [facility];  Service: Urology;  Laterality: N/A;  . TONSILLECTOMY AND ADENOIDECTOMY  child  . TRANSTHORACIC ECHOCARDIOGRAM  04/16/2015   moderate LVH,  ef 60-65%, grade 1 diastolic dysfunction/  trivial AR, MR and PR/  mild dilated ascending aorta/  mild TR  . TRANSURETHRAL RESECTION OF PROSTATE N/A 10/27/2017   Procedure: TRANSURETHRAL RESECTION OF THE PROSTATE (TURP);  Surgeon: Rene Paci, MD;  Location: [facility];  Service: Urology;  Laterality: N/A;  . UPPER GASTROINTESTINAL ENDOSCOPY  last one 02-15-2014    Family History  Problem Relation Age of Onset  . Coronary artery disease Father   . Colon cancer Father        ? unknown age dx/died at 59  . Heart attack Paternal Grandfather   . Coronary artery disease Maternal Grandmother   . Colon cancer Maternal Grandmother 33  . Cancer Maternal Grandmother        colon  . Melanoma Mother   . Coronary artery disease Paternal Grandmother   . Heart attack Maternal Grandfather   . Hypertension Maternal Aunt   . Prostate cancer Neg Hx   . Stroke Neg Hx   . Esophageal cancer Neg Hx   . Pancreatic cancer Neg Hx   . Rectal cancer Neg Hx   . Stomach cancer Neg Hx     Allergies  Allergen Reactions  . Augmentin [Amoxicillin-Pot Clavulanate] Diarrhea    *Can take plain Amoxicillin* Has patient had a PCN reaction causing immediate rash, facial/tongue/throat swelling, SOB or lightheadedness with hypotension: No Has patient had a PCN reaction causing severe rash involving mucus membranes or skin necrosis: No Has patient had a PCN reaction that required hospitalization No Has patient had a PCN reaction occurring within  the last 10 years: Yes If all of the above answers are "NO", then may proceed with Cephalosporin use.   Marland Kitchen Penicillins Rash    Childhood reaction *Can take Amoxicillin* Has patient had a PCN reaction causing immediate rash, facial/tongue/throat swelling, SOB or lightheadedness with hypotension: Unknown Has patient had a PCN reaction causing severe rash involving mucus membranes or skin necrosis: Unknown Has patient had a PCN reaction that required hospitalization: Unknown Has patient had a PCN reaction occurring within the last 10  years: Unknown  If all of the above answers are "NO", then may proceed with Cephalosporin use    Current Outpatient Medications on File Prior to Visit  Medication Sig Dispense Refill  . aspirin EC 81 MG tablet Take 1 tablet (81 mg total) by mouth daily. 90 tablet 3  . Ciclopirox 1 % shampoo APP TO SCALP UTD AND PRN  2  . Glycerin-Polysorbate 80 (REFRESH DRY EYE THERAPY OP) Apply to eye as needed.    Marland Kitchen ketoconazole (NIZORAL) 2 % shampoo Apply 1 application topically as needed for irritation. For eczema    . losartan (COZAAR) 100 MG tablet Take 1 tablet (100 mg total) by mouth daily. 90 tablet 1  . Magnesium Malate 1250 (141.7 Mg) MG TABS Take 1 tablet by mouth 3 (three) times a week.    . meclizine (ANTIVERT) 25 MG tablet Take 25 mg by mouth 3 (three) times daily as needed for dizziness.    . metoprolol tartrate (LOPRESSOR) 25 MG tablet TAKE ONE TABLET BY MOUTH TWICE A DAY 180 tablet 0  . Multiple Vitamins-Minerals (MULTIVITAMIN ADULTS 50+ PO) Take 1 tablet by mouth daily.    . Omega-3 Fatty Acids (FISH OIL) 1200 MG CAPS Take 1 capsule by mouth daily.    . nitroGLYCERIN (NITROSTAT) 0.4 MG SL tablet Place 1 tablet (0.4 mg total) under the tongue every 5 (five) minutes as needed for chest pain. 25 tablet 11  . rosuvastatin (CRESTOR) 5 MG tablet Take 1 tablet (5 mg total) by mouth daily. 90 tablet 3   No current facility-administered medications on file prior to  visit.    BP 130/63 (BP Location: Right Arm, Patient Position: Sitting, Cuff Size: Small)   Pulse (!) 51   Temp (!) 97.4 F (36.3 C) (Temporal)   Resp 16   Wt 180 lb (81.6 kg)   SpO2 98%   BMI 28.19 kg/m       Objective:   Physical Exam Constitutional:      General: He is not in acute distress.    Appearance: He is well-developed.  HENT:     Head: Normocephalic and atraumatic.  Cardiovascular:     Rate and Rhythm: Normal rate and regular rhythm.     Heart sounds: No murmur.  Pulmonary:     Effort: Pulmonary effort is normal. No respiratory distress.     Breath sounds: Normal breath sounds. No wheezing or rales.  Skin:    General: Skin is warm and dry.  Neurological:     Mental Status: He is alert and oriented to person, place, and time.  Psychiatric:        Behavior: Behavior normal.        Thought Content: Thought content normal.           Assessment & Plan:  HTN- bp stable on current regimen. Continue same.  Obtain follow up CMET.  Hyperlipidemia- tolerating statin. LDL at goal. Continue same.

## 2019-09-13 ENCOUNTER — Telehealth (INDEPENDENT_AMBULATORY_CARE_PROVIDER_SITE_OTHER): Payer: Medicare Other | Admitting: Family

## 2019-09-13 DIAGNOSIS — J40 Bronchitis, not specified as acute or chronic: Secondary | ICD-10-CM | POA: Diagnosis not present

## 2019-09-13 MED ORDER — BENZONATATE 100 MG PO CAPS: 100.0000 mg | ORAL_CAPSULE | Freq: Three times a day (TID) | ORAL | 0 refills | Status: DC | PRN

## 2019-09-13 MED ORDER — AZITHROMYCIN 250 MG PO TABS: ORAL_TABLET | ORAL | 0 refills | Status: DC

## 2019-09-13 NOTE — Progress Notes (Signed)
Virtual Visit via Video Note  I connected with AGRON SWINEY on 09/13/19 at  2:40 PM EDT by a video enabled telemedicine application and verified that I am speaking with the correct person using two identifiers.  Location: Patient: home Provider: work   I discussed the limitations of evaluation and management by telemedicine and the availability of in person appointments. The patient expressed understanding and agreed to proceed. Only the patient and myself were present for today's video call.   History of Present Illness:  Yellow green sputum, + nasal drainage (clear). Symptoms started last Friday. No sick contacts at home.  Reports that his temp has been normal. He denies loss of taste or smell.  Denies SOB, body aches.  Reports that he did a 15 minute covid test at [facility] and it was negative.   He has tried otc robitussin DM, dayquil.     Observations/Objective:   Gen: Awake, alert, no acute distress Resp: Breathing is even and non-labored Psych: calm/pleasant demeanor Neuro: Alert and Oriented x 3, + facial symmetry, speech is clear.   Assessment and Plan:  Bronchitis-new- will rx with azithromycin and prn tessalon. Pt is advised to call if symptoms worsen or if symptoms fail to improve.   Follow Up Instructions:    I discussed the assessment and treatment plan with the patient. The patient was provided an opportunity to ask questions and all were answered. The patient agreed with the plan and demonstrated an understanding of the instructions.   The patient was advised to call back or seek an in-person evaluation if the symptoms worsen or if the condition fails to improve as anticipated.  Nance Pear, NP

## 2019-09-13 NOTE — Patient Instructions (Signed)
Please begin azithromycin (antibiotic) and as needed tessalon for cough.

## 2019-09-15 ENCOUNTER — Encounter: Payer: Self-pay | Admitting: Family

## 2019-09-16 ENCOUNTER — Other Ambulatory Visit: Payer: Self-pay | Admitting: Family

## 2019-09-16 ENCOUNTER — Encounter: Payer: Self-pay | Admitting: Family

## 2019-09-19 ENCOUNTER — Telehealth: Payer: Self-pay | Admitting: Family

## 2019-09-19 ENCOUNTER — Encounter: Payer: Self-pay | Admitting: Family

## 2019-09-19 DIAGNOSIS — R059 Cough, unspecified: Secondary | ICD-10-CM

## 2019-09-19 NOTE — Telephone Encounter (Signed)
Contacted patient.  He reports CP has improved during the day. Worse with a deep breath. He denies fever today.  Advised pt to complete chest x-ray tomorrow morning first floor and imaging.  He is advised to go the emergency department should he develop worsening pain, shortness of breath.  Patient verbalizes understanding.

## 2019-09-20 ENCOUNTER — Ambulatory Visit (HOSPITAL_BASED_OUTPATIENT_CLINIC_OR_DEPARTMENT_OTHER)
Admission: RE | Admit: 2019-09-20 | Discharge: 2019-09-20 | Disposition: A | Discharge status: Home / Self Care | Payer: Medicare Other | Source: Ambulatory Visit | Attending: Family | Admitting: Family

## 2019-09-20 ENCOUNTER — Other Ambulatory Visit: Payer: Self-pay

## 2019-09-20 DIAGNOSIS — R059 Cough, unspecified: Secondary | ICD-10-CM

## 2019-09-20 DIAGNOSIS — R05 Cough: Secondary | ICD-10-CM | POA: Diagnosis not present

## 2019-09-20 DIAGNOSIS — R079 Chest pain, unspecified: Secondary | ICD-10-CM | POA: Diagnosis not present

## 2019-10-06 DIAGNOSIS — R972 Elevated prostate specific antigen [PSA]: Secondary | ICD-10-CM | POA: Diagnosis not present

## 2019-10-13 DIAGNOSIS — N528 Other male erectile dysfunction: Secondary | ICD-10-CM | POA: Diagnosis not present

## 2019-10-13 DIAGNOSIS — R972 Elevated prostate specific antigen [PSA]: Secondary | ICD-10-CM | POA: Diagnosis not present

## 2019-10-13 DIAGNOSIS — R3912 Poor urinary stream: Secondary | ICD-10-CM | POA: Diagnosis not present

## 2019-10-13 DIAGNOSIS — N401 Enlarged prostate with lower urinary tract symptoms: Secondary | ICD-10-CM | POA: Diagnosis not present

## 2019-10-27 DIAGNOSIS — Z85820 Personal history of malignant melanoma of skin: Secondary | ICD-10-CM | POA: Diagnosis not present

## 2019-10-27 DIAGNOSIS — L720 Epidermal cyst: Secondary | ICD-10-CM | POA: Diagnosis not present

## 2019-10-27 DIAGNOSIS — L821 Other seborrheic keratosis: Secondary | ICD-10-CM | POA: Diagnosis not present

## 2019-10-27 DIAGNOSIS — B351 Tinea unguium: Secondary | ICD-10-CM | POA: Diagnosis not present

## 2019-10-27 DIAGNOSIS — D2262 Melanocytic nevi of left upper limb, including shoulder: Secondary | ICD-10-CM | POA: Diagnosis not present

## 2019-10-27 DIAGNOSIS — L7211 Pilar cyst: Secondary | ICD-10-CM | POA: Diagnosis not present

## 2019-10-27 DIAGNOSIS — L918 Other hypertrophic disorders of the skin: Secondary | ICD-10-CM | POA: Diagnosis not present

## 2019-10-27 DIAGNOSIS — D1801 Hemangioma of skin and subcutaneous tissue: Secondary | ICD-10-CM | POA: Diagnosis not present

## 2019-11-22 DIAGNOSIS — H02889 Meibomian gland dysfunction of unspecified eye, unspecified eyelid: Secondary | ICD-10-CM | POA: Diagnosis not present

## 2019-11-27 ENCOUNTER — Other Ambulatory Visit: Payer: Self-pay | Admitting: Family

## 2019-12-06 ENCOUNTER — Other Ambulatory Visit: Payer: Self-pay | Admitting: Cardiology

## 2019-12-06 DIAGNOSIS — E782 Mixed hyperlipidemia: Secondary | ICD-10-CM

## 2020-01-19 DIAGNOSIS — Z23 Encounter for immunization: Secondary | ICD-10-CM | POA: Diagnosis not present

## 2020-03-03 ENCOUNTER — Other Ambulatory Visit: Payer: Self-pay | Admitting: Family

## 2020-03-06 ENCOUNTER — Other Ambulatory Visit: Payer: Self-pay | Admitting: Family

## 2020-03-19 ENCOUNTER — Telehealth: Payer: Self-pay | Admitting: Family

## 2020-03-19 NOTE — Telephone Encounter (Signed)
Forms put on provider's folder

## 2020-03-19 NOTE — Telephone Encounter (Signed)
Pt wife dropped of POA  Put into [place] bin up front

## 2020-04-19 DIAGNOSIS — N401 Enlarged prostate with lower urinary tract symptoms: Secondary | ICD-10-CM | POA: Insufficient documentation

## 2020-04-19 DIAGNOSIS — I10 Essential (primary) hypertension: Secondary | ICD-10-CM | POA: Insufficient documentation

## 2020-04-19 DIAGNOSIS — K449 Diaphragmatic hernia without obstruction or gangrene: Secondary | ICD-10-CM | POA: Insufficient documentation

## 2020-04-19 DIAGNOSIS — Z8719 Personal history of other diseases of the digestive system: Secondary | ICD-10-CM | POA: Insufficient documentation

## 2020-04-19 DIAGNOSIS — F32A Depression, unspecified: Secondary | ICD-10-CM | POA: Insufficient documentation

## 2020-04-19 DIAGNOSIS — K760 Fatty (change of) liver, not elsewhere classified: Secondary | ICD-10-CM | POA: Insufficient documentation

## 2020-04-19 DIAGNOSIS — Z86006 Personal history of melanoma in-situ: Secondary | ICD-10-CM | POA: Insufficient documentation

## 2020-04-19 DIAGNOSIS — K5730 Diverticulosis of large intestine without perforation or abscess without bleeding: Secondary | ICD-10-CM | POA: Insufficient documentation

## 2020-04-19 DIAGNOSIS — Z86010 Personal history of colonic polyps: Secondary | ICD-10-CM | POA: Insufficient documentation

## 2020-04-19 DIAGNOSIS — Z8659 Personal history of other mental and behavioral disorders: Secondary | ICD-10-CM | POA: Insufficient documentation

## 2020-04-19 DIAGNOSIS — G8929 Other chronic pain: Secondary | ICD-10-CM | POA: Insufficient documentation

## 2020-04-19 DIAGNOSIS — F1011 Alcohol abuse, in remission: Secondary | ICD-10-CM | POA: Insufficient documentation

## 2020-04-19 DIAGNOSIS — G4730 Sleep apnea, unspecified: Secondary | ICD-10-CM | POA: Insufficient documentation

## 2020-04-19 DIAGNOSIS — N320 Bladder-neck obstruction: Secondary | ICD-10-CM | POA: Insufficient documentation

## 2020-04-24 ENCOUNTER — Other Ambulatory Visit: Payer: Self-pay

## 2020-04-24 ENCOUNTER — Encounter: Payer: Self-pay | Admitting: Cardiology

## 2020-04-24 ENCOUNTER — Ambulatory Visit (INDEPENDENT_AMBULATORY_CARE_PROVIDER_SITE_OTHER): Payer: Medicare Other | Admitting: Cardiology

## 2020-04-24 VITALS — BP 132/70 | HR 58 | Ht 67.0 in | Wt 182.1 lb

## 2020-04-24 DIAGNOSIS — I10 Essential (primary) hypertension: Secondary | ICD-10-CM | POA: Diagnosis not present

## 2020-04-24 DIAGNOSIS — E782 Mixed hyperlipidemia: Secondary | ICD-10-CM | POA: Diagnosis not present

## 2020-04-24 DIAGNOSIS — I25118 Atherosclerotic heart disease of native coronary artery with other forms of angina pectoris: Secondary | ICD-10-CM | POA: Diagnosis not present

## 2020-04-24 NOTE — Progress Notes (Signed)
Cardiology Office Note:    Date:  04/24/2020   ID:  Gerald [name], DOB Jul 05, 1945, MRN [id number]  PCP:  Sandford Craze, NP  Cardiologist:  Norman Herrlich, MD    Referring MD: Sandford Craze, NP    ASSESSMENT:    1. Coronary artery disease of native artery of native heart with stable angina pectoris (HCC)   2. Hypertension, unspecified type   3. Mixed hyperlipidemia    PLAN:    In order of problems listed above:  1. He is doing well no angina on current medical therapy continue aspirin beta-blocker statin and nitroglycerin if needed. 2. Stable BP at target well-controlled with ARB low-dose beta-blocker 3. Continue high intensity statin overdue for labs we will draw CMP lipid profile today he is having no muscle pain or weakness.   Next appointment: 1 year   Medication Adjustments/Labs and Tests Ordered: Current medicines are reviewed at length with the patient today.  Concerns regarding medicines are outlined above.  Orders Placed This Encounter  Procedures  . EKG 12-Lead   No orders of the defined types were placed in this encounter.   Chief Complaint  Patient presents with  . Follow-up  . Coronary Artery Disease    History of Present Illness:    Gerald [name] is a 75 y.o. male with a hx of hypertension hyperlipidemia and CAD CTA defined CAD calcium score 119- 63rd percentile mid right coronary stenosis 50 to 69% mid LAD 25 to 49%.  He was last seen 12/07/2018.  Incidental finding of CT scan with thickening of the distal esophagus with a background history of esophageal reflux. Compliance with diet, lifestyle and medications: Yes  He is fully vaccinated and practices all effective medications. Tolerates his statin without muscle pain or weakness Is had no angina dyspnea palpitation or syncope and has not used his nitroglycerin Past Medical History:  Diagnosis Date  . APC (atrial premature contractions) 10/18/2018  . Back pain, chronic   . Benign  localized prostatic hyperplasia with lower urinary tract symptoms (LUTS)   . Bladder outlet obstruction   . Bleeding from urethra in male 10/26/2017   3 times over 7 hours  . Chest pain in adult 10/18/2018  . Depression   . Diverticulosis of colon   . Elevated PSA   . Fatty liver   . GERD (gastroesophageal reflux disease)   . Hiatal hernia   . History of alcohol abuse    quit 1989  . History of esophageal stricture    2003  S/P  DILATATION  . History of esophagitis   . History of melanoma in situ    06/ 2017  REMOVAL RIGHT CHEEK AREA  . History of sepsis 06/06/2016   admitted for urosepsis , UTI due to pseudomonas-- resolved   . HTN (hypertension) 09/01/2011  . Hx of adenomatous colonic polyps    2003   TUBULAR ADENOMA  . Hyperlipidemia   . Hypertension   . Hypertensive left ventricular hypertrophy, without heart failure 06/05/2015  . 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  . Mild sleep apnea    2004 mild, per sleep study-- no cpap recommended  (pt denies)    Past Surgical History:  Procedure Laterality Date  . CARDIOVASCULAR STRESS TEST  04-17-2015  dr Tresa Endo   Normal stress nulcear study w/ a small, mild, fixed inferior defect consistent with inferior thinning; no ischemia; normal LV function and wall motion , stress ef 56%  . COLONOSCOPY  last one 12-02-2006  . CYSTOSCOPY  WITH URETHRAL DILATATION N/A 10/27/2017   Procedure: CYSTOSCOPY WITH URETHRAL DILATATION;  Surgeon: Rene Paci, MD;  Location: [facility];  Service: Urology;  Laterality: N/A;  ONLY NEEDS 90 MIN FOR ALL PROCEDURES  . EXCISION LEFT ARM MASS  1988  . MELANOMA EXCISION    . MOHS SURGERY  09/06/2015   right parotidemomasseteric excision w/ complex repair for melanoma in situ  . THULIUM LASER TURP (TRANSURETHRAL RESECTION OF PROSTATE) N/A 07/29/2016   Procedure: Morton Peters LASER TURP (TRANSURETHRAL RESECTION OF PROSTATE)/ TRANSRECTAL ULTRASOUND GUIDED PROSTATE BIOPSY;  Surgeon: Hildred Laser, MD;  Location: [facility];  Service: Urology;  Laterality: N/A;  . TONSILLECTOMY AND ADENOIDECTOMY  child  . TRANSTHORACIC ECHOCARDIOGRAM  04/16/2015   moderate LVH,  ef 60-65%, grade 1 diastolic dysfunction/  trivial AR, MR and PR/  mild dilated ascending aorta/  mild TR  . TRANSURETHRAL RESECTION OF PROSTATE N/A 10/27/2017   Procedure: TRANSURETHRAL RESECTION OF THE PROSTATE (TURP);  Surgeon: Rene Paci, MD;  Location: [facility];  Service: Urology;  Laterality: N/A;  . UPPER GASTROINTESTINAL ENDOSCOPY  last one 02-15-2014    Current Medications: Current Meds  Medication Sig  . aspirin EC 81 MG tablet Take 1 tablet (81 mg total) by mouth daily.  . Ciclopirox 1 % shampoo APP TO SCALP UTD AND PRN  . Glycerin-Polysorbate 80 (REFRESH DRY EYE THERAPY OP) Apply to eye as needed.  Marland Kitchen ketoconazole (NIZORAL) 2 % shampoo Apply 1 application topically as needed for irritation. For eczema  . losartan (COZAAR) 100 MG tablet Take 1 tablet (100 mg total) by mouth daily.  . Magnesium Malate 1250 (141.7 Mg) MG TABS Take 1 tablet by mouth 3 (three) times a week.  . metoprolol tartrate (LOPRESSOR) 25 MG tablet Take 1 tablet (25 mg total) by mouth 2 (two) times daily.  . Multiple Vitamins-Minerals (MULTIVITAMIN ADULTS 50+ PO) Take 1 tablet by mouth daily.  . Omega-3 Fatty Acids (FISH OIL) 1200 MG CAPS Take 1 capsule by mouth daily.  . rosuvastatin (CRESTOR) 5 MG tablet TAKE ONE TABLET BY MOUTH ONE TIME DAILY     Allergies:   Augmentin [amoxicillin-pot clavulanate], Tessalon [benzonatate], and Penicillins   Social History   Socioeconomic History  . Marital status: Married    Spouse name: Not on file  . Number of children: 2  . Years of education: Not on file  . Highest education level: Not on file  Occupational History  . Occupation: Retired  Tobacco Use  . Smoking status: Former Smoker    Packs/day: 2.00    Years: 30.00    Pack years: 60.00     Types: Cigarettes    Quit date: 08/29/2006    Years since quitting: 13.6  . Smokeless tobacco: Never Used  Vaping Use  . Vaping Use: Never used  Substance and Sexual Activity  . Alcohol use: No  . Drug use: No  . Sexual activity: Yes  Other Topics Concern  . Not on file  Social History Narrative   Diet: regular, lots of vegetables-----exercise : none but active   Caffeine use:  5 cups coffee daily         Social Determinants of Health   Financial Resource Strain: Low Risk   . Difficulty of Paying Living Expenses: Not hard at all  Food Insecurity: No Food Insecurity  . Worried About Programme researcher, broadcasting/film/video in the Last Year: Never true  . Ran Out of Food in  the Last Year: Never true  Transportation Needs: No Transportation Needs  . Lack of Transportation (Medical): No  . Lack of Transportation (Non-Medical): No  Physical Activity: Not on file  Stress: Not on file  Social Connections: Not on file     Family History: The patient's family history includes Cancer in his maternal grandmother; Colon cancer in his father; Colon cancer (age of onset: 40) in his maternal grandmother; Coronary artery disease in his father, maternal grandmother, and paternal grandmother; Heart attack in his maternal grandfather and paternal grandfather; Hypertension in his maternal aunt; Melanoma in his mother. There is no history of Prostate cancer, Stroke, Esophageal cancer, Pancreatic cancer, Rectal cancer, or Stomach cancer. ROS:   Please see the history of present illness.    All other systems reviewed and are negative.  EKGs/Labs/Other Studies Reviewed:    The following studies were reviewed today:  EKG:  EKG ordered today and personally reviewed.  The ekg ordered today demonstrates sinus bradycardia 58 bpm otherwise normal EKG with left axis deviation  Recent Labs: 06/28/2019: ALT 22; BUN 20; Creatinine, Ser 0.89; Potassium 4.3; Sodium 140  Recent Lipid Panel    Component Value Date/Time    CHOL 151 12/28/2018 1638   TRIG 130.0 12/28/2018 1638   HDL 43.70 12/28/2018 1638   CHOLHDL 3 12/28/2018 1638   VLDL 26.0 12/28/2018 1638   LDLCALC 81 12/28/2018 1638   LDLDIRECT 88.0 09/17/2016 1233    Physical Exam:    VS:  BP (!) 146/76 (BP Location: Right Arm, Patient Position: Sitting, Cuff Size: Normal)   Pulse (!) 58   Ht 5\' 7"  (1.702 m)   Wt 182 lb 1.6 oz (82.6 kg)   SpO2 96%   BMI 28.52 kg/m     Wt Readings from Last 3 Encounters:  04/24/20 182 lb 1.6 oz (82.6 kg)  06/28/19 180 lb (81.6 kg)  12/28/18 176 lb (79.8 kg)  Repeat blood pressure by me 132/80 132/70  GEN:  Well nourished, well developed in no acute distress HEENT: Normal NECK: No JVD; No carotid bruits LYMPHATICS: No lymphadenopathy CARDIAC: RRR, no murmurs, rubs, gallops RESPIRATORY:  Clear to auscultation without rales, wheezing or rhonchi  ABDOMEN: Soft, non-tender, non-distended MUSCULOSKELETAL:  No edema; No deformity  SKIN: Warm and dry NEUROLOGIC:  Alert and oriented x 3 PSYCHIATRIC:  Normal affect    Signed, Norman Herrlich, MD  04/24/2020 2:19 PM    [facility]

## 2020-04-24 NOTE — Patient Instructions (Signed)

## 2020-04-25 ENCOUNTER — Telehealth: Payer: Self-pay

## 2020-04-25 LAB — COMPREHENSIVE METABOLIC PANEL
ALT: 23 IU/L (ref 0–44)
AST: 22 IU/L (ref 0–40)
Albumin/Globulin Ratio: 1.8 (ref 1.2–2.2)
Albumin: 4.3 g/dL (ref 3.7–4.7)
Alkaline Phosphatase: 96 IU/L (ref 44–121)
BUN/Creatinine Ratio: 19 (ref 10–24)
BUN: 19 mg/dL (ref 8–27)
Bilirubin Total: 0.5 mg/dL (ref 0.0–1.2)
CO2: 24 mmol/L (ref 20–29)
Calcium: 9.5 mg/dL (ref 8.6–10.2)
Chloride: 104 mmol/L (ref 96–106)
Creatinine, Ser: 1.01 mg/dL (ref 0.76–1.27)
GFR calc Af Amer: 85 mL/min/{1.73_m2} (ref >59)
GFR calc non Af Amer: 73 mL/min/{1.73_m2} (ref >59)
Globulin, Total: 2.4 g/dL (ref 1.5–4.5)
Glucose: 100 mg/dL — ABNORMAL HIGH (ref 65–99)
Potassium: 4.5 mmol/L (ref 3.5–5.2)
Sodium: 140 mmol/L (ref 134–144)
Total Protein: 6.7 g/dL (ref 6.0–8.5)

## 2020-04-25 LAB — LIPID PANEL
Chol/HDL Ratio: 3.6 ratio (ref 0.0–5.0)
Cholesterol, Total: 150 mg/dL (ref 100–199)
HDL: 42 mg/dL (ref >39)
LDL Chol Calc (NIH): 65 mg/dL (ref 0–99)
Triglycerides: 266 mg/dL — ABNORMAL HIGH (ref 0–149)
VLDL Cholesterol Cal: 43 mg/dL — ABNORMAL HIGH (ref 5–40)

## 2020-04-25 NOTE — Telephone Encounter (Signed)
Gerald [name] is returning [employer]. He states a detailed message with the results can be left so playing phone tag does not have to continue. Please advise.

## 2020-04-25 NOTE — Telephone Encounter (Signed)
-----   Message from Richardo Priest, MD sent at 04/25/2020  8:12 AM EST ----- Good result no changes

## 2020-04-25 NOTE — Telephone Encounter (Signed)
Spoke with patient regarding results and recommendation.  Patient verbalizes understanding and is agreeable to plan of care. Advised patient to call back with any issues or concerns.  

## 2020-04-25 NOTE — Telephone Encounter (Signed)
Left message on patients voicemail to please return our call.   

## 2020-05-01 ENCOUNTER — Ambulatory Visit: Payer: Self-pay | Admitting: *Deleted

## 2020-05-02 DIAGNOSIS — L821 Other seborrheic keratosis: Secondary | ICD-10-CM | POA: Diagnosis not present

## 2020-05-02 DIAGNOSIS — L814 Other melanin hyperpigmentation: Secondary | ICD-10-CM | POA: Diagnosis not present

## 2020-05-02 DIAGNOSIS — D2261 Melanocytic nevi of right upper limb, including shoulder: Secondary | ICD-10-CM | POA: Diagnosis not present

## 2020-05-02 DIAGNOSIS — L918 Other hypertrophic disorders of the skin: Secondary | ICD-10-CM | POA: Diagnosis not present

## 2020-05-02 DIAGNOSIS — L720 Epidermal cyst: Secondary | ICD-10-CM | POA: Diagnosis not present

## 2020-05-02 DIAGNOSIS — Z85820 Personal history of malignant melanoma of skin: Secondary | ICD-10-CM | POA: Diagnosis not present

## 2020-05-02 DIAGNOSIS — D1801 Hemangioma of skin and subcutaneous tissue: Secondary | ICD-10-CM | POA: Diagnosis not present

## 2020-05-05 ENCOUNTER — Other Ambulatory Visit: Payer: Self-pay | Admitting: Family

## 2020-05-29 DIAGNOSIS — H5361 Abnormal dark adaptation curve: Secondary | ICD-10-CM | POA: Diagnosis not present

## 2020-05-29 DIAGNOSIS — H2513 Age-related nuclear cataract, bilateral: Secondary | ICD-10-CM | POA: Diagnosis not present

## 2020-05-29 DIAGNOSIS — H02889 Meibomian gland dysfunction of unspecified eye, unspecified eyelid: Secondary | ICD-10-CM | POA: Diagnosis not present

## 2020-07-19 ENCOUNTER — Encounter: Payer: Self-pay | Admitting: Family

## 2020-07-20 ENCOUNTER — Encounter: Payer: Self-pay | Admitting: Family

## 2020-07-20 ENCOUNTER — Telehealth (INDEPENDENT_AMBULATORY_CARE_PROVIDER_SITE_OTHER): Payer: Medicare Other | Admitting: Family

## 2020-07-20 DIAGNOSIS — B349 Viral infection, unspecified: Secondary | ICD-10-CM

## 2020-07-20 DIAGNOSIS — U071 COVID-19: Secondary | ICD-10-CM

## 2020-07-20 DIAGNOSIS — J069 Acute upper respiratory infection, unspecified: Secondary | ICD-10-CM | POA: Diagnosis not present

## 2020-07-20 MED ORDER — CHERATUSSIN AC 100-10 MG/5ML PO SOLN: 5.0000 mL | Freq: Three times a day (TID) | ORAL | 0 refills | Status: DC | PRN

## 2020-07-20 NOTE — Patient Instructions (Signed)
Continue chloraseptic spray. Continue

## 2020-07-20 NOTE — Assessment & Plan Note (Addendum)
Recommended he purchase otc covid test today. If positive, let me know and we can make a referral to the covid infusion clinic.  In the meantime, rx sent for prn cheratussin for cough. Recommended that he continue chloraseptic spray and tylenol as needed for pain.  He is advised to call if symptoms worsen or if not improved in 3-4 days. Go to er if SOB unable to swallow. Pt verbalizes understanding.   Addendum:  Pt tested positive for covid via home test. Will refer to covid infusion center.

## 2020-07-20 NOTE — Telephone Encounter (Signed)
Patient was scheduled to see Melissa at 8:40 am (current 8:40 patient cancelled)

## 2020-07-20 NOTE — Progress Notes (Signed)
Virtual telephone visit    Virtual Visit via Telephone Note   This visit type was conducted due to national recommendations for restrictions regarding the COVID-19 Pandemic (e.g. social distancing) in an effort to limit this patient's exposure and mitigate transmission in our community. Due to his co-morbid illnesses, this patient is at least at moderate risk for complications without adequate follow up. This format is felt to be most appropriate for this patient at this time. The patient did not have access to video technology or had technical difficulties with video requiring transitioning to audio format only (telephone). Physical exam was limited to content and character of the telephone converstion. Patient was able to get the patient set up on a telephone visit.   Patient location: home. Patient and provider in visit Provider location: Office  I discussed the limitations of evaluation and management by telemedicine and the availability of in person appointments. The patient expressed understanding and agreed to proceed.   Visit Date: 07/20/2020  Today's healthcare provider: Lemont Fillers, NP     Subjective:    Patient ID: Gerald [name], male    DOB: 1945-04-17, 75 y.o.   MRN: [id number]  Chief Complaint  Patient presents with  . Sore Throat    Complains of sore throat  . Cough    Complains of persistent cough  . Nasal Congestion    Patient reports congestion with Runny nose and headache.    HPI Patient is in today to discuss multiple symptoms: Symptoms include HA (intermittent),nasal drainage, coughing, general malaise, not sleeping well.  Sore throat. Symptoms began 07/17/20, he has not taken a covid test.   He is using chloraseptic spray as needed for sore throat. Has taken tylenol once.   Past Medical History:  Diagnosis Date  . APC (atrial premature contractions) 10/18/2018  . Back pain, chronic   . Benign localized prostatic hyperplasia with lower  urinary tract symptoms (LUTS)   . Bladder outlet obstruction   . Bleeding from urethra in male 10/26/2017   3 times over 7 hours  . Chest pain in adult 10/18/2018  . Depression   . Diverticulosis of colon   . Elevated PSA   . Fatty liver   . GERD (gastroesophageal reflux disease)   . Hiatal hernia   . History of alcohol abuse    quit 1989  . History of esophageal stricture    2003  S/P  DILATATION  . History of esophagitis   . History of melanoma in situ    06/ 2017  REMOVAL RIGHT CHEEK AREA  . History of sepsis 06/06/2016   admitted for urosepsis , UTI due to pseudomonas-- resolved   . HTN (hypertension) 09/01/2011  . Hx of adenomatous colonic polyps    2003   TUBULAR ADENOMA  . Hyperlipidemia   . Hypertension   . Hypertensive left ventricular hypertrophy, without heart failure 06/05/2015  . 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  . Mild sleep apnea    2004 mild, per sleep study-- no cpap recommended  (pt denies)    Past Surgical History:  Procedure Laterality Date  . CARDIOVASCULAR STRESS TEST  04-17-2015  dr Tresa Endo   Normal stress nulcear study w/ a small, mild, fixed inferior defect consistent with inferior thinning; no ischemia; normal LV function and wall motion , stress ef 56%  . COLONOSCOPY  last one 12-02-2006  . CYSTOSCOPY WITH URETHRAL DILATATION N/A 10/27/2017   Procedure: CYSTOSCOPY WITH URETHRAL DILATATION;  Surgeon: Rene Paci, MD;  Location: [facility];  Service: Urology;  Laterality: N/A;  ONLY NEEDS 90 MIN FOR ALL PROCEDURES  . EXCISION LEFT ARM MASS  1988  . MELANOMA EXCISION    . MOHS SURGERY  09/06/2015   right parotidemomasseteric excision w/ complex repair for melanoma in situ  . THULIUM LASER TURP (TRANSURETHRAL RESECTION OF PROSTATE) N/A 07/29/2016   Procedure: Morton Peters LASER TURP (TRANSURETHRAL RESECTION OF PROSTATE)/ TRANSRECTAL ULTRASOUND GUIDED PROSTATE BIOPSY;  Surgeon: Hildred Laser, MD;  Location: [facility];  Service: Urology;  Laterality: N/A;  . TONSILLECTOMY AND ADENOIDECTOMY  child  . TRANSTHORACIC ECHOCARDIOGRAM  04/16/2015   moderate LVH,  ef 60-65%, grade 1 diastolic dysfunction/  trivial AR, MR and PR/  mild dilated ascending aorta/  mild TR  . TRANSURETHRAL RESECTION OF PROSTATE N/A 10/27/2017   Procedure: TRANSURETHRAL RESECTION OF THE PROSTATE (TURP);  Surgeon: Rene Paci, MD;  Location: [facility];  Service: Urology;  Laterality: N/A;  . UPPER GASTROINTESTINAL ENDOSCOPY  last one 02-15-2014    Family History  Problem Relation Age of Onset  . Coronary artery disease Father   . Colon cancer Father        ? unknown age dx/died at 40  . Heart attack Paternal Grandfather   . Coronary artery disease Maternal Grandmother   . Colon cancer Maternal Grandmother 64  . Cancer Maternal Grandmother        colon  . Melanoma Mother   . Coronary artery disease Paternal Grandmother   . Heart attack Maternal Grandfather   . Hypertension Maternal Aunt   . Prostate cancer Neg Hx   . Stroke Neg Hx   . Esophageal cancer Neg Hx   . Pancreatic cancer Neg Hx   . Rectal cancer Neg Hx   . Stomach cancer Neg Hx     Social History   Socioeconomic History  . Marital status: Married    Spouse name: Not on file  . Number of children: 2  . Years of education: Not on file  . Highest education level: Not on file  Occupational History  . Occupation: Retired  Tobacco Use  . Smoking status: Former Smoker    Packs/day: 2.00    Years: 30.00    Pack years: 60.00    Types: Cigarettes    Quit date: 08/29/2006    Years since quitting: 13.9  . Smokeless tobacco: Never Used  Vaping Use  . Vaping Use: Never used  Substance and Sexual Activity  . Alcohol use: No  . Drug use: No  . Sexual activity: Yes  Other Topics Concern  . Not on file  Social History Narrative   Diet: regular, lots of vegetables-----exercise : none but active   Caffeine use:  5 cups coffee  daily         Social Determinants of Health   Financial Resource Strain: Not on file  Food Insecurity: Not on file  Transportation Needs: Not on file  Physical Activity: Not on file  Stress: Not on file  Social Connections: Not on file  Intimate Partner Violence: Not on file    Outpatient Medications Prior to Visit  Medication Sig Dispense Refill  . aspirin EC 81 MG tablet Take 1 tablet (81 mg total) by mouth daily. 90 tablet 3  . Ciclopirox 1 % shampoo APP TO SCALP UTD AND PRN  2  . Glycerin-Polysorbate 80 (REFRESH DRY EYE THERAPY OP) Apply to eye as needed.    Marland Kitchen ketoconazole (  NIZORAL) 2 % shampoo Apply 1 application topically as needed for irritation. For eczema    . losartan (COZAAR) 100 MG tablet TAKE ONE TABLET BY MOUTH ONE TIME DAILY 30 tablet 2  . Magnesium Malate 1250 (141.7 Mg) MG TABS Take 1 tablet by mouth 3 (three) times a week.    . metoprolol tartrate (LOPRESSOR) 25 MG tablet TAKE ONE TABLET BY MOUTH TWICE A DAY 60 tablet 2  . Multiple Vitamins-Minerals (MULTIVITAMIN ADULTS 50+ PO) Take 1 tablet by mouth daily.    . Omega-3 Fatty Acids (FISH OIL) 1200 MG CAPS Take 1 capsule by mouth daily.    . rosuvastatin (CRESTOR) 5 MG tablet TAKE ONE TABLET BY MOUTH ONE TIME DAILY 90 tablet 3  . azithromycin (ZITHROMAX) 250 MG tablet Take 2 tabs by mouth today, then one tab once daily for 4 more days. 6 tablet 0  . meclizine (ANTIVERT) 25 MG tablet Take 25 mg by mouth 3 (three) times daily as needed for dizziness.    . nitroGLYCERIN (NITROSTAT) 0.4 MG SL tablet Place 1 tablet (0.4 mg total) under the tongue every 5 (five) minutes as needed for chest pain. 25 tablet 11  . tadalafil (CIALIS) 20 MG tablet TAKE ONE TABLET BY MOUTH ONE TIME DAILY AS NEEDED 20 tablet 3   No facility-administered medications prior to visit.    Allergies  Allergen Reactions  . Augmentin [Amoxicillin-Pot Clavulanate] Diarrhea    *Can take plain Amoxicillin* Has patient had a PCN reaction causing  immediate rash, facial/tongue/throat swelling, SOB or lightheadedness with hypotension: No Has patient had a PCN reaction causing severe rash involving mucus membranes or skin necrosis: No Has patient had a PCN reaction that required hospitalization No Has patient had a PCN reaction occurring within the last 10 years: Yes If all of the above answers are "NO", then may proceed with Cephalosporin use.   Kimberlee Nearing [Benzonatate]     Deep uncontrollable cough  . Penicillins Rash    Childhood reaction *Can take Amoxicillin* Has patient had a PCN reaction causing immediate rash, facial/tongue/throat swelling, SOB or lightheadedness with hypotension: Unknown Has patient had a PCN reaction causing severe rash involving mucus membranes or skin necrosis: Unknown Has patient had a PCN reaction that required hospitalization: Unknown Has patient had a PCN reaction occurring within the last 10 years: Unknown  If all of the above answers are "NO", then may proceed with Cephalosporin use    ROS     Objective:    Physical Exam HENT:     Head:     Comments: Mild voice hoarseness noted Neurological:     Mental Status: He is alert.  Psychiatric:        Attention and Perception: Attention normal.        Mood and Affect: Mood normal.        Speech: Speech normal.        Cognition and Memory: Cognition normal.     There were no vitals taken for this visit. Wt Readings from Last 3 Encounters:  04/24/20 182 lb 1.6 oz (82.6 kg)  06/28/19 180 lb (81.6 kg)  12/28/18 176 lb (79.8 kg)    Diabetic Foot Exam - Simple   No data filed    Lab Results  Component Value Date   WBC 7.0 07/22/2016   HGB 14.0 10/28/2017   HCT 41.0 10/28/2017   PLT 140 (L) 07/22/2016   GLUCOSE 100 (H) 04/24/2020   CHOL 150 04/24/2020   TRIG 266 (H)  04/24/2020   HDL 42 04/24/2020   LDLDIRECT 88.0 09/17/2016   LDLCALC 65 04/24/2020   ALT 23 04/24/2020   AST 22 04/24/2020   NA 140 04/24/2020   K 4.5 04/24/2020   CL  104 04/24/2020   CREATININE 1.01 04/24/2020   BUN 19 04/24/2020   CO2 24 04/24/2020   TSH 2.42 08/29/2010   PSA 6.28 (H) 09/01/2011   HGBA1C 5.6 09/16/2007    Lab Results  Component Value Date   TSH 2.42 08/29/2010   Lab Results  Component Value Date   WBC 7.0 07/22/2016   HGB 14.0 10/28/2017   HCT 41.0 10/28/2017   MCV 90.3 07/22/2016   PLT 140 (L) 07/22/2016   Lab Results  Component Value Date   NA 140 04/24/2020   K 4.5 04/24/2020   CO2 24 04/24/2020   GLUCOSE 100 (H) 04/24/2020   BUN 19 04/24/2020   CREATININE 1.01 04/24/2020   BILITOT 0.5 04/24/2020   ALKPHOS 96 04/24/2020   AST 22 04/24/2020   ALT 23 04/24/2020   PROT 6.7 04/24/2020   ALBUMIN 4.3 04/24/2020   CALCIUM 9.5 04/24/2020   ANIONGAP 7 10/28/2017   GFR 83.84 06/28/2019   Lab Results  Component Value Date   CHOL 150 04/24/2020   Lab Results  Component Value Date   HDL 42 04/24/2020   Lab Results  Component Value Date   LDLCALC 65 04/24/2020   Lab Results  Component Value Date   TRIG 266 (H) 04/24/2020   Lab Results  Component Value Date   CHOLHDL 3.6 04/24/2020   Lab Results  Component Value Date   HGBA1C 5.6 09/16/2007       Assessment & Plan:   Problem List Items Addressed This Visit   None     I am having Chase Picket. [place] "Ben" maintain his ketoconazole, Multiple Vitamins-Minerals (MULTIVITAMIN ADULTS 50+ PO), Magnesium Malate, Ciclopirox, Glycerin-Polysorbate 80 (REFRESH DRY EYE THERAPY OP), meclizine, Fish Oil, aspirin EC, nitroGLYCERIN, azithromycin, tadalafil, rosuvastatin, metoprolol tartrate, and losartan.  No orders of the defined types were placed in this encounter.    I discussed the assessment and treatment plan with the patient. The patient was provided an opportunity to ask questions and all were answered. The patient agreed with the plan and demonstrated an understanding of the instructions.   The patient was advised to call back or seek an in-person  evaluation if the symptoms worsen or if the condition fails to improve as anticipated.  I provided 11 minutes of non-face-to-face time during this encounter.   Lemont Fillers, NP [employer] [facility] [employer] [phone] (phone) [phone] (fax)  [facility]

## 2020-07-20 NOTE — Telephone Encounter (Signed)
Patient advised Gerald [name] does not have any openings today, we will get him set up with another provider later today

## 2020-07-20 NOTE — Addendum Note (Signed)
Addended by: Debbrah Alar on: 07/20/2020 12:07 PM   Modules accepted: Orders

## 2020-07-21 ENCOUNTER — Other Ambulatory Visit (HOSPITAL_COMMUNITY): Payer: Self-pay

## 2020-07-21 ENCOUNTER — Telehealth: Payer: Self-pay | Admitting: Unknown Physician Specialty

## 2020-07-21 MED ORDER — NIRMATRELVIR/RITONAVIR (PAXLOVID)TABLET
3.0000 | ORAL_TABLET | Freq: Two times a day (BID) | ORAL | 0 refills | Status: AC
  Filled 2020-07-21: qty 30, 5d supply, fill #0

## 2020-07-21 NOTE — Telephone Encounter (Signed)
Outpatient Oral COVID Treatment Note  I connected with Gerald [name] on 07/21/2020/9:30 AM by telephone and verified that I am speaking with the correct person using two identifiers.  I discussed the limitations, risks, security, and privacy concerns of performing an evaluation and management service by telephone and the availability of in person appointments. I also discussed with the patient that there may be a patient responsible charge related to this service. The patient expressed understanding and agreed to proceed.  Patient location: home Provider location: home  Diagnosis: COVID-19 infection  Purpose of visit: Discussion of potential use of Molnupiravir or Paxlovid, a new treatment for mild to moderate COVID-19 viral infection in non-hospitalized patients.   Subjective: Patient is a 74 y.o. male who has been diagnosed with COVID 19 viral infection.  Their symptoms began on 4/19 with flu symptoms.    Past Medical History:  Diagnosis Date  . APC (atrial premature contractions) 10/18/2018  . Back pain, chronic   . Benign localized prostatic hyperplasia with lower urinary tract symptoms (LUTS)   . Bladder outlet obstruction   . Bleeding from urethra in male 10/26/2017   3 times over 7 hours  . Chest pain in adult 10/18/2018  . Depression   . Diverticulosis of colon   . Elevated PSA   . Fatty liver   . GERD (gastroesophageal reflux disease)   . Hiatal hernia   . History of alcohol abuse    quit 1989  . History of esophageal stricture    2003  S/P  DILATATION  . History of esophagitis   . History of melanoma in situ    06/ 2017  REMOVAL RIGHT CHEEK AREA  . History of sepsis 06/06/2016   admitted for urosepsis , UTI due to pseudomonas-- resolved   . HTN (hypertension) 09/01/2011  . Hx of adenomatous colonic polyps    2003   TUBULAR ADENOMA  . Hyperlipidemia   . Hypertension   . Hypertensive left ventricular hypertrophy, without heart failure 06/05/2015  . 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 Mild sleep apnea    2004 mild, per sleep study-- no cpap recommended  (pt denies)    Allergies  Allergen Reactions  . Augmentin [Amoxicillin-Pot Clavulanate] Diarrhea    *Can take plain Amoxicillin* Has patient had a PCN reaction causing immediate rash, facial/tongue/throat swelling, SOB or lightheadedness with hypotension: No Has patient had a PCN reaction causing severe rash involving mucus membranes or skin necrosis: No Has patient had a PCN reaction that required hospitalization No Has patient had a PCN reaction occurring within the last 10 years: Yes If all of the above answers are "NO", then may proceed with Cephalosporin use.   Lavella Lemons [Benzonatate]     Deep uncontrollable cough  . Penicillins Rash    Childhood reaction *Can take Amoxicillin* Has patient had a PCN reaction causing immediate rash, facial/tongue/throat swelling, SOB or lightheadedness with hypotension: Unknown Has patient had a PCN reaction causing severe rash involving mucus membranes or skin necrosis: Unknown Has patient had a PCN reaction that required hospitalization: Unknown Has patient had a PCN reaction occurring within the last 10 years: Unknown  If all of the above answers are "NO", then may proceed with Cephalosporin use     Current Outpatient Medications:  .  aspirin EC 81 MG tablet, Take 1 tablet (81 mg total) by mouth daily., Disp: 90 tablet, Rfl: 3 .  Ciclopirox 1 % shampoo, APP TO SCALP UTD AND PRN, Disp: , Rfl: 2 .  Glycerin-Polysorbate 80 (REFRESH DRY EYE THERAPY OP), Apply to eye as needed., Disp: , Rfl:  .  guaiFENesin-codeine (CHERATUSSIN AC) 100-10 MG/5ML syrup, Take 5 mLs by mouth 3 (three) times daily as needed for cough., Disp: 60 mL, Rfl: 0 .  ketoconazole (NIZORAL) 2 % shampoo, Apply 1 application topically as needed for irritation. For eczema, Disp: , Rfl:  .  losartan (COZAAR) 100 MG tablet, TAKE ONE TABLET BY MOUTH ONE TIME DAILY, Disp: 30 tablet, Rfl: 2 .   Magnesium Malate 1250 (141.7 Mg) MG TABS, Take 1 tablet by mouth 3 (three) times a week., Disp: , Rfl:  .  meclizine (ANTIVERT) 25 MG tablet, Take 25 mg by mouth 3 (three) times daily as needed for dizziness., Disp: , Rfl:  .  metoprolol tartrate (LOPRESSOR) 25 MG tablet, TAKE ONE TABLET BY MOUTH TWICE A DAY, Disp: 60 tablet, Rfl: 2 .  Multiple Vitamins-Minerals (MULTIVITAMIN ADULTS 50+ PO), Take 1 tablet by mouth daily., Disp: , Rfl:  .  nitroGLYCERIN (NITROSTAT) 0.4 MG SL tablet, Place 1 tablet (0.4 mg total) under the tongue every 5 (five) minutes as needed for chest pain., Disp: 25 tablet, Rfl: 11 .  Omega-3 Fatty Acids (FISH OIL) 1200 MG CAPS, Take 1 capsule by mouth daily., Disp: , Rfl:  .  rosuvastatin (CRESTOR) 5 MG tablet, TAKE ONE TABLET BY MOUTH ONE TIME DAILY, Disp: 90 tablet, Rfl: 3 .  tadalafil (CIALIS) 20 MG tablet, TAKE ONE TABLET BY MOUTH ONE TIME DAILY AS NEEDED, Disp: 20 tablet, Rfl: 3  Objective: Patient appears/sounds well.  They are in no apparent distress.  Breathing is non labored.  Mood and behavior are normal.  Laboratory Data:  Recent Results (from the past 2160 hour(s))  Comprehensive metabolic panel     Status: Abnormal   Collection Time: 04/24/20  2:26 PM  Result Value Ref Range   Glucose 100 (H) 65 - 99 mg/dL   BUN 19 8 - 27 mg/dL   Creatinine, Ser 1.01 0.76 - 1.27 mg/dL   GFR calc non Af Amer 73 >59 mL/min/1.73   GFR calc Af Amer 85 >59 mL/min/1.73    Comment: **In accordance with recommendations from the NKF-ASN Task force,**   [facility] is in the process of updating its eGFR calculation to the   2021 CKD-EPI creatinine equation that estimates kidney function   without a race variable.    BUN/Creatinine Ratio 19 10 - 24   Sodium 140 134 - 144 mmol/L   Potassium 4.5 3.5 - 5.2 mmol/L   Chloride 104 96 - 106 mmol/L   CO2 24 20 - 29 mmol/L   Calcium 9.5 8.6 - 10.2 mg/dL   Total Protein 6.7 6.0 - 8.5 g/dL   Albumin 4.3 3.7 - 4.7 g/dL   Globulin, Total 2.4  1.5 - 4.5 g/dL   Albumin/Globulin Ratio 1.8 1.2 - 2.2   Bilirubin Total 0.5 0.0 - 1.2 mg/dL   Alkaline Phosphatase 96 44 - 121 IU/L   AST 22 0 - 40 IU/L   ALT 23 0 - 44 IU/L  Lipid panel     Status: Abnormal   Collection Time: 04/24/20  2:26 PM  Result Value Ref Range   Cholesterol, Total 150 100 - 199 mg/dL   Triglycerides 266 (H) 0 - 149 mg/dL   HDL 42 >39 mg/dL   VLDL Cholesterol Cal 43 (H) 5 - 40 mg/dL   LDL Chol Calc (NIH) 65 0 - 99 mg/dL   Chol/HDL Ratio 3.6 0.0 -  5.0 ratio    Comment:                                   T. Chol/HDL Ratio                                             Men  Women                               1/2 Avg.Risk  3.4    3.3                                   Avg.Risk  5.0    4.4                                2X Avg.Risk  9.6    7.1                                3X Avg.Risk 23.4   11.0      Assessment: 75 y.o. male with mild/moderate COVID 19 viral infection diagnosed on 4/19 at high risk for progression to severe COVID 19.  Plan:  This patient is a 75 y.o. male that meets the following criteria for Emergency Use Authorization of: Paxlovid 1. Age >12 yr AND > 40 kg 2. SARS-COV-2 positive test 3. Symptom onset < 5 days 4. Mild-to-moderate COVID disease with high risk for severe progression to hospitalization or death  I have spoken and communicated the following to the patient or parent/caregiver regarding: 1. Paxlovid is an unapproved drug that is authorized for use under an Emergency Use Authorization.  2. There are no adequate, approved, available products for the treatment of COVID-19 in adults who have mild-to-moderate COVID-19 and are at high risk for progressing to severe COVID-19, including hospitalization or death. 3. Other therapeutics are currently authorized. For additional information on all products authorized for treatment or prevention of COVID-19, please see  [email or web address].  4. There are benefits and risks of taking this treatment as outlined in the "Fact Sheet for Patients and Caregivers."  5. "Fact Sheet for Patients and Caregivers" was reviewed with patient. A hard copy will be provided to patient from pharmacy prior to the patient receiving treatment. 6. Patients should continue to self-isolate and use infection control measures (e.g., wear mask, isolate, social distance, avoid sharing personal items, clean and disinfect "high touch" surfaces, and frequent handwashing) according to CDC guidelines.  7. The patient or parent/caregiver has the option to accept or refuse treatment. 8. Patient medication history was reviewed for potential drug interactions:Interaction with home meds: Crestor which he can hold 9. Patient's GFR was calculated to be 73, and they were therefore prescribed Normal dose (GFR>60) - nirmatrelvir 118m tab (2 tablet) by mouth twice daily AND ritonavir 1058mtab (1 tablet) by mouth twice daily   After reviewing above information with the patient, the patient agrees to receive Paxlovid.  Follow up instructions:    . Take prescription BID x 5 days as directed . Reach out to pharmacist for  counseling on medication if desired . For concerns regarding further COVID symptoms please follow up with your PCP or urgent care . For urgent or life-threatening issues, seek care at your local emergency department  The patient was provided an opportunity to ask questions, and all were answered. The patient agreed with the plan and demonstrated an understanding of the instructions.   Script sent to [facility] and opted to pick up RX.  The patient was advised to call their PCP or seek an in-person evaluation if the symptoms worsen or if the condition fails to improve as anticipated.   I provided 20 minutes of non  face-to-face telephone visit time during this encounter, and > 50% was spent counseling as documented under my assessment & plan.  Kathrine Haddock, NP 07/21/2020 /9:30 AM  Will hold Cialis and Crestor

## 2020-07-25 ENCOUNTER — Encounter: Payer: Self-pay | Admitting: Family

## 2020-08-03 ENCOUNTER — Other Ambulatory Visit: Payer: Self-pay | Admitting: Family

## 2020-08-03 NOTE — Telephone Encounter (Signed)
Last seen 06/28/2019. No pending appointments.   Okay for 30 and set appointment.

## 2020-08-07 IMAGING — DX DG CHEST 2V
2 series · 2 of 2 positions shown · non-contrast
Comparison: June 06, 2016.

CLINICAL DATA: Cough, chest pain.

EXAM:
CHEST - 2 VIEW

[chest pa]
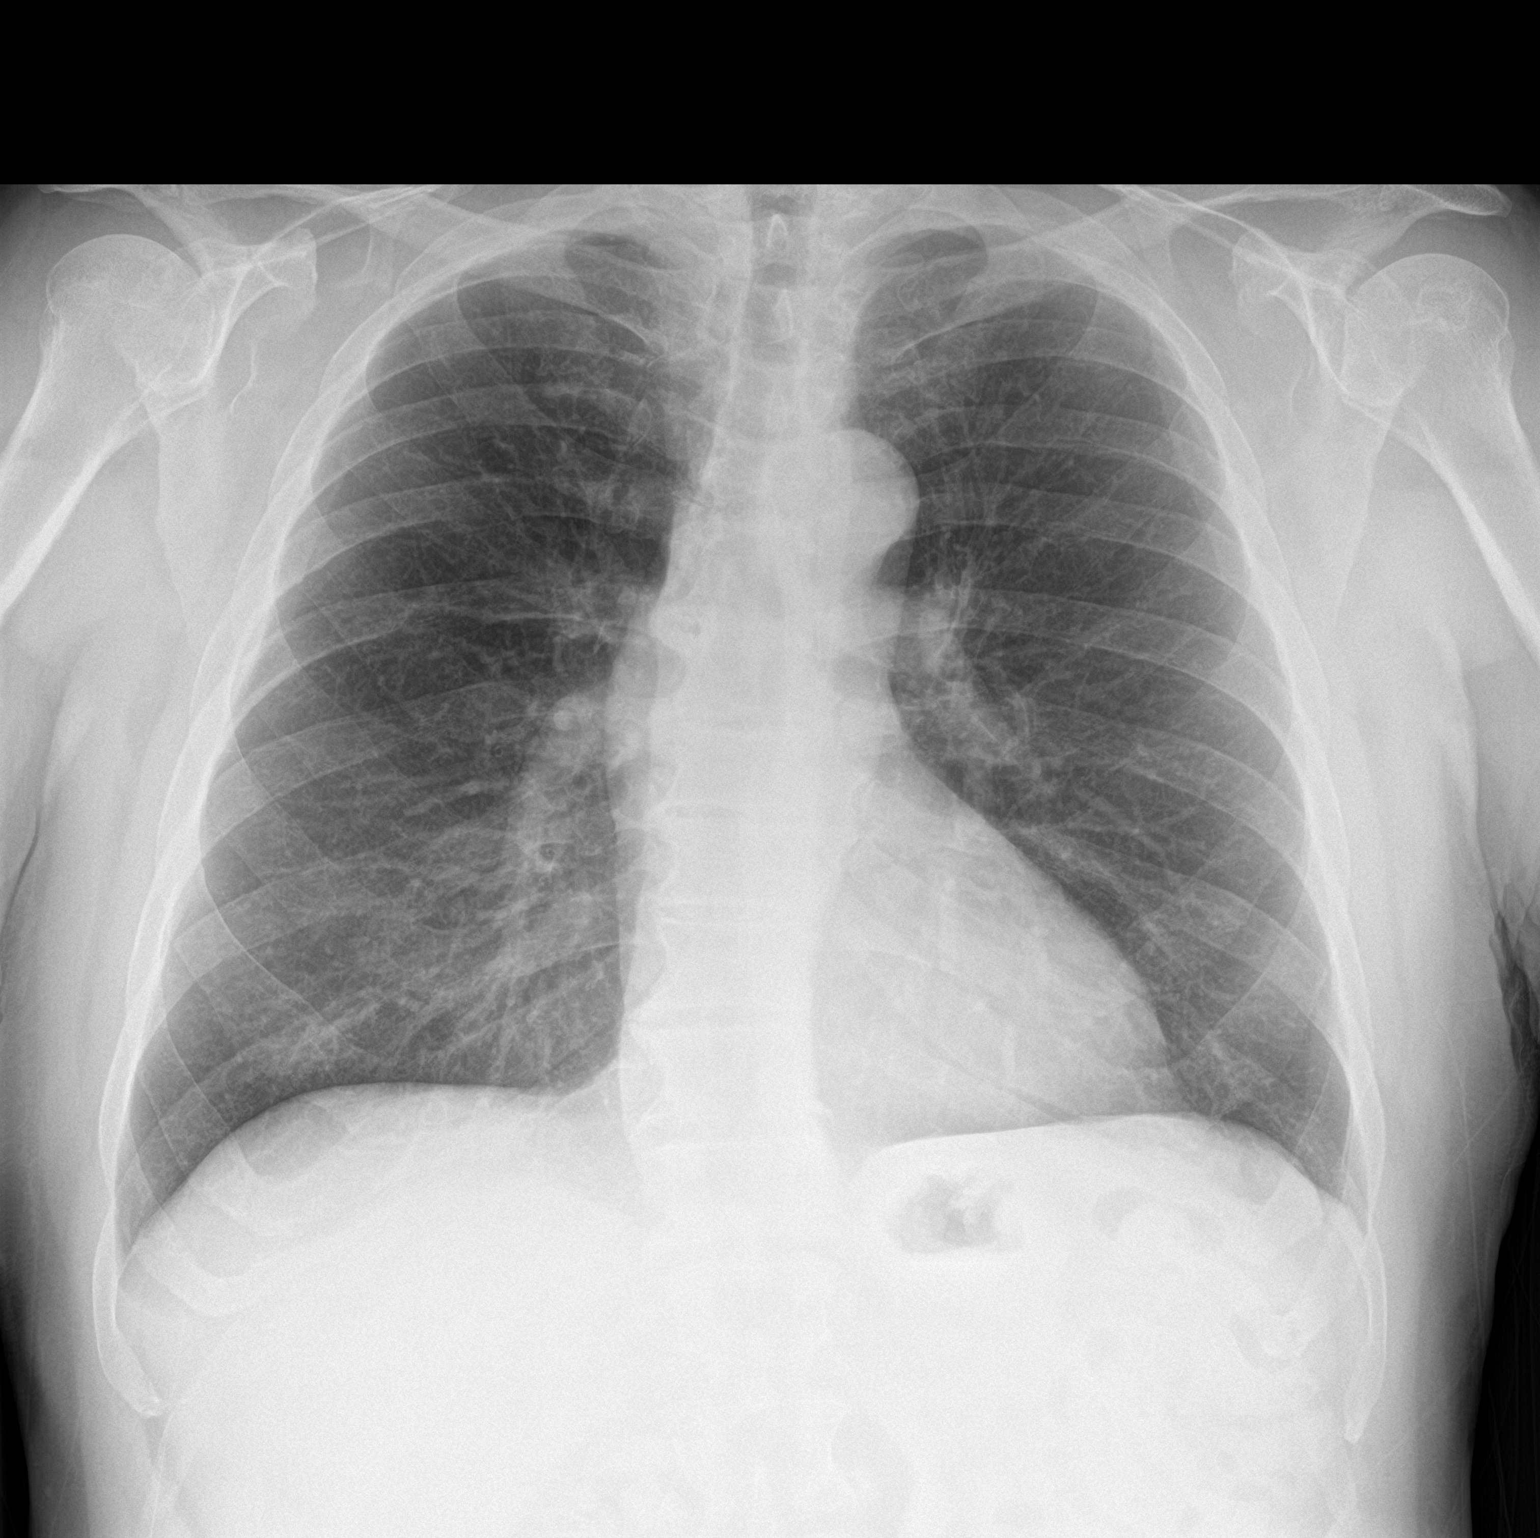

[chest lat]
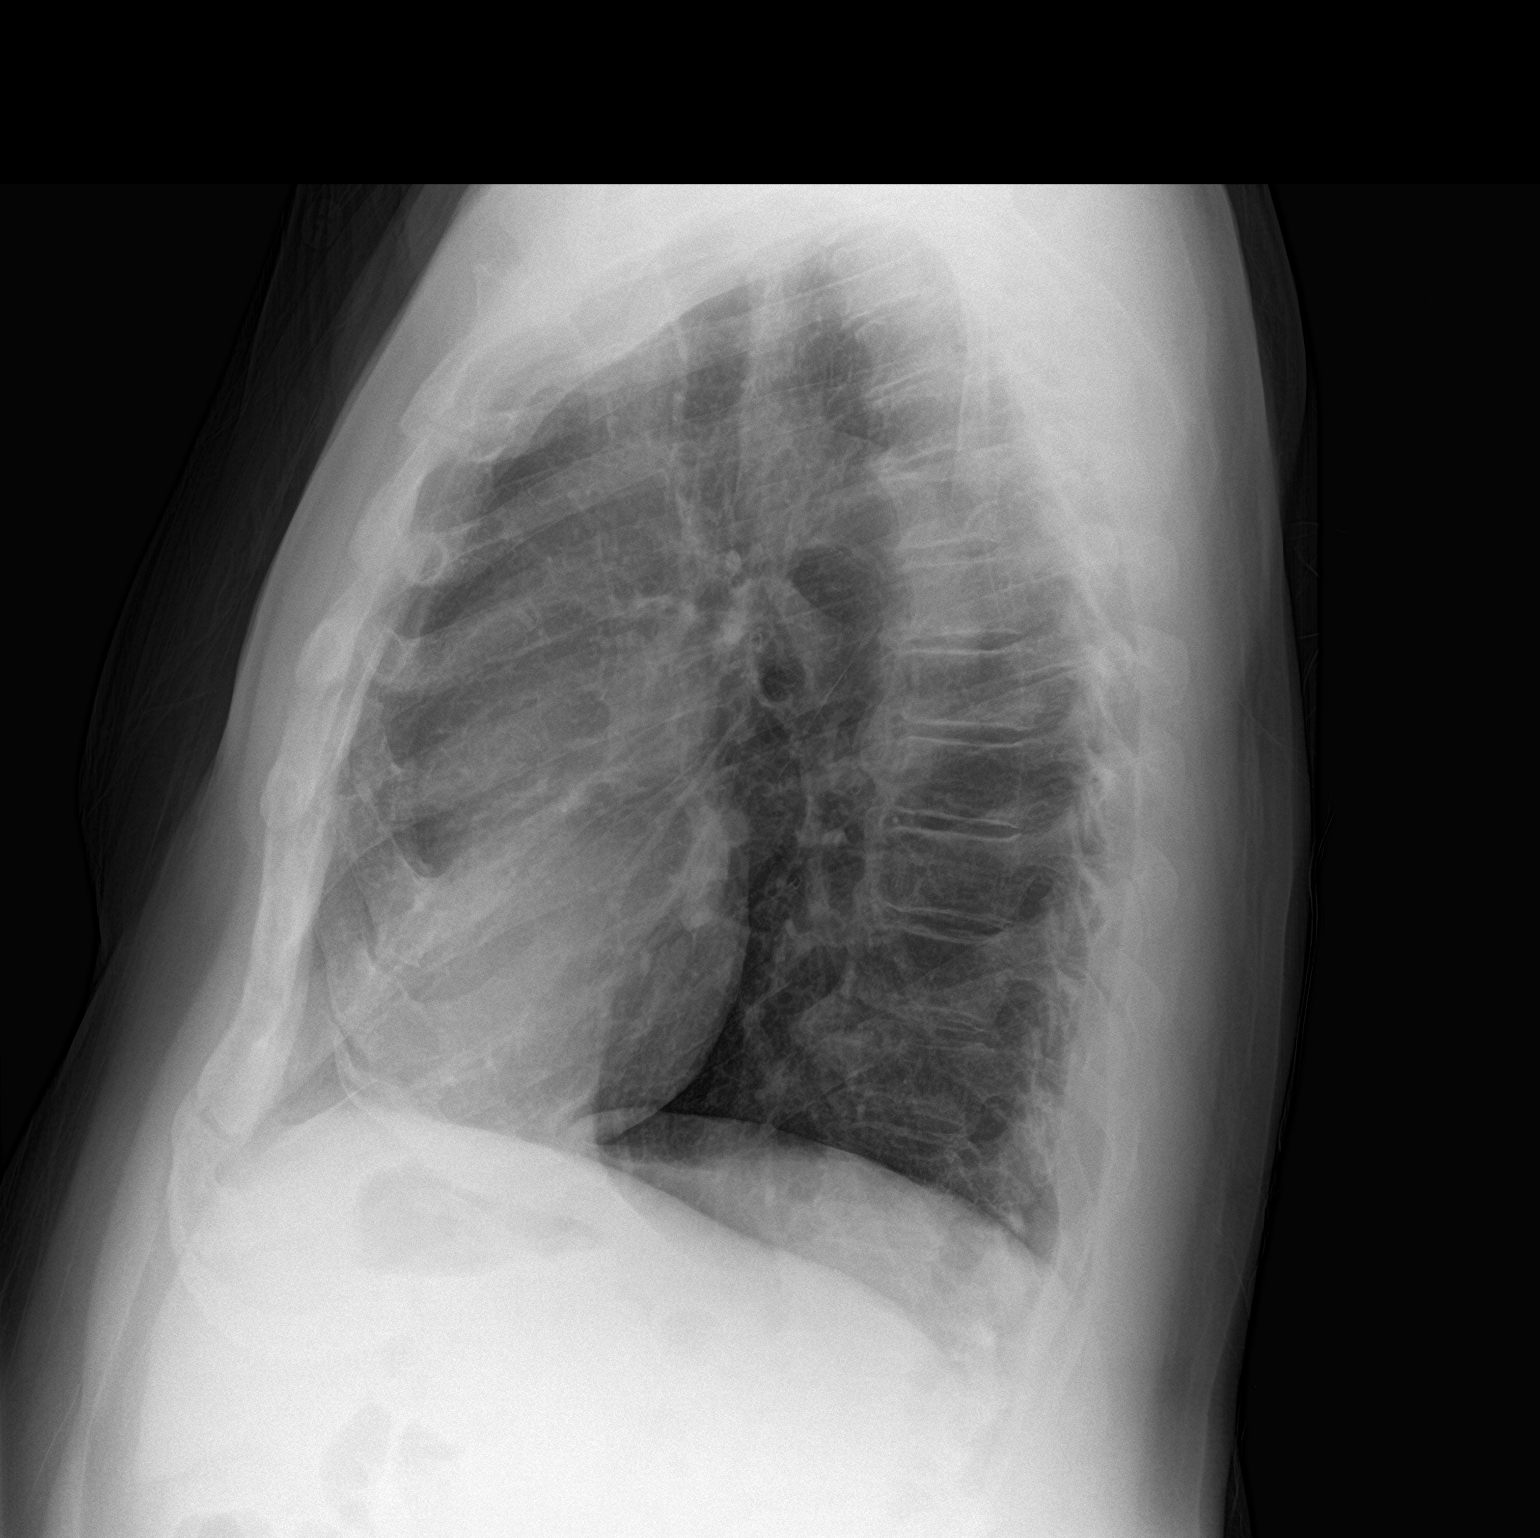

[2 of 2 positions shown; findings below may reference images not displayed]

FINDINGS: The heart size and mediastinal contours are within normal limits.
Both lungs are clear. No pneumothorax or pleural effusion is noted.
The visualized skeletal structures are unremarkable.
IMPRESSION: No active cardiopulmonary disease.

## 2020-08-09 ENCOUNTER — Other Ambulatory Visit: Payer: Self-pay | Admitting: Family

## 2020-10-09 ENCOUNTER — Ambulatory Visit: Payer: Medicare Other

## 2020-10-09 ENCOUNTER — Telehealth: Payer: Self-pay

## 2020-10-09 NOTE — Telephone Encounter (Signed)
Patient had a 2:20 appt for a phone visit for his medicare wellness exam. Attempted x 3 to reach patient. Left message for t to call back to reschedule.

## 2020-10-09 NOTE — Progress Notes (Deleted)
Subjective:   Gerald [name] is a 75 y.o. male who presents for Medicare Annual/Subsequent preventive examination.  I connected with *** today by telephone and verified that I am speaking with the correct person using two identifiers. Location patient: home Location provider: work Persons participating in the virtual visit: patient, Marine scientist.    I discussed the limitations, risks, security and privacy concerns of performing an evaluation and management service by telephone and the availability of in person appointments. I also discussed with the patient that there may be a patient responsible charge related to this service. The patient expressed understanding and verbally consented to this telephonic visit.    Interactive audio and video telecommunications were attempted between this provider and patient, however failed, due to patient having technical difficulties OR patient did not have access to video capability.  We continued and completed visit with audio only.  Some vital signs may be absent or patient reported.   Time Spent with patient on telephone encounter: *** minutes   Review of Systems    ***       Objective:    There were no vitals filed for this visit. There is no height or weight on file to calculate BMI.  Advanced Directives 04/28/2019 04/20/2018 10/27/2017 09/14/2017 04/13/2017 07/29/2016 06/06/2016  Does Patient Have a Medical Advance Directive? Yes Yes Yes Yes Yes Yes Yes  Type of Paramedic of [place];Living will Living will;Healthcare Power of [place];Living will [place];Living will [place];Living will  Does patient want to make changes to medical advance directive? No - Patient declined - No - Patient declined No - Patient declined No - Patient declined No - Patient declined Yes (Inpatient - patient defers changing a medical advance directive at this time)  Copy of  [place] in Chart? No - copy requested No - copy requested - - No - copy requested No - copy requested -    Current Medications (verified) Outpatient Encounter Medications as of 10/09/2020  Medication Sig   aspirin EC 81 MG tablet Take 1 tablet (81 mg total) by mouth daily.   Ciclopirox 1 % shampoo APP TO SCALP UTD AND PRN   Glycerin-Polysorbate 80 (REFRESH DRY EYE THERAPY OP) Apply to eye as needed.   guaiFENesin-codeine (CHERATUSSIN AC) 100-10 MG/5ML syrup Take 5 mLs by mouth 3 (three) times daily as needed for cough.   ketoconazole (NIZORAL) 2 % shampoo Apply 1 application topically as needed for irritation. For eczema   losartan (COZAAR) 100 MG tablet TAKE ONE TABLET BY MOUTH ONE TIME DAILY   Magnesium Malate 1250 (141.7 Mg) MG TABS Take 1 tablet by mouth 3 (three) times a week.   meclizine (ANTIVERT) 25 MG tablet Take 25 mg by mouth 3 (three) times daily as needed for dizziness.   metoprolol tartrate (LOPRESSOR) 25 MG tablet TAKE ONE TABLET BY MOUTH TWICE A DAY   Multiple Vitamins-Minerals (MULTIVITAMIN ADULTS 50+ PO) Take 1 tablet by mouth daily.   nitroGLYCERIN (NITROSTAT) 0.4 MG SL tablet Place 1 tablet (0.4 mg total) under the tongue every 5 (five) minutes as needed for chest pain.   Omega-3 Fatty Acids (FISH OIL) 1200 MG CAPS Take 1 capsule by mouth daily.   rosuvastatin (CRESTOR) 5 MG tablet TAKE ONE TABLET BY MOUTH ONE TIME DAILY   tadalafil (CIALIS) 20 MG tablet TAKE ONE TABLET BY MOUTH ONE TIME DAILY AS NEEDED   No facility-administered encounter  medications on file as of 10/09/2020.    Allergies (verified) Augmentin [amoxicillin-pot clavulanate], Tessalon [benzonatate], and Penicillins   History: Past Medical History:  Diagnosis Date   APC (atrial premature contractions) 10/18/2018   Back pain, chronic    Benign localized prostatic hyperplasia with lower urinary tract symptoms (LUTS)    Bladder outlet obstruction    Bleeding from urethra in male  10/26/2017   3 times over 7 hours   Chest pain in adult 10/18/2018   Depression    Diverticulosis of colon    Elevated PSA    Fatty liver    GERD (gastroesophageal reflux disease)    Hiatal hernia    History of alcohol abuse    quit 1989   History of esophageal stricture    2003  S/P  DILATATION   History of esophagitis    History of melanoma in situ    06/ 2017  REMOVAL RIGHT CHEEK AREA   History of sepsis 06/06/2016   admitted for urosepsis , UTI due to pseudomonas-- resolved    HTN (hypertension) 09/01/2011   Hx of adenomatous colonic polyps    2003   TUBULAR ADENOMA   Hyperlipidemia    Hypertension    Hypertensive left ventricular hypertrophy, without heart failure 06/05/2015   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   Mild sleep apnea    2004 mild, per sleep study-- no cpap recommended  (pt denies)   Past Surgical History:  Procedure Laterality Date   CARDIOVASCULAR STRESS TEST  04-17-2015  dr Claiborne Billings   Normal stress nulcear study w/ a small, mild, fixed inferior defect consistent with inferior thinning; no ischemia; normal LV function and wall motion , stress ef 56%   COLONOSCOPY  last one 12-02-2006   CYSTOSCOPY WITH URETHRAL DILATATION N/A 10/27/2017   Procedure: CYSTOSCOPY WITH URETHRAL DILATATION;  Surgeon: Ceasar Mons, MD;  Location: [facility];  Service: Urology;  Laterality: N/A;  ONLY NEEDS 90 MIN FOR ALL PROCEDURES   EXCISION LEFT ARM MASS  1988   MELANOMA EXCISION     MOHS SURGERY  09/06/2015   right parotidemomasseteric excision w/ complex repair for melanoma in situ   THULIUM LASER TURP (TRANSURETHRAL RESECTION OF PROSTATE) N/A 07/29/2016   Procedure: Marcelino Duster LASER TURP (TRANSURETHRAL RESECTION OF PROSTATE)/ TRANSRECTAL ULTRASOUND GUIDED PROSTATE BIOPSY;  Surgeon: Nickie Retort, MD;  Location: [facility];  Service: Urology;  Laterality: N/A;   TONSILLECTOMY AND ADENOIDECTOMY  child   TRANSTHORACIC ECHOCARDIOGRAM  04/16/2015    moderate LVH,  ef 23-30%, grade 1 diastolic dysfunction/  trivial AR, MR and PR/  mild dilated ascending aorta/  mild TR   TRANSURETHRAL RESECTION OF PROSTATE N/A 10/27/2017   Procedure: TRANSURETHRAL RESECTION OF THE PROSTATE (TURP);  Surgeon: Ceasar Mons, MD;  Location: [facility];  Service: Urology;  Laterality: N/A;   UPPER GASTROINTESTINAL ENDOSCOPY  last one 02-15-2014   Family History  Problem Relation Age of Onset   Coronary artery disease Father    Colon cancer Father        ? unknown age dx/died at 26   Heart attack Paternal Grandfather    Coronary artery disease Maternal Grandmother    Colon cancer Maternal Grandmother 8   Cancer Maternal Grandmother        colon   Melanoma Mother    Coronary artery disease Paternal Grandmother    Heart attack Maternal Grandfather    Hypertension Maternal Aunt    Prostate cancer Neg Hx  Stroke Neg Hx    Esophageal cancer Neg Hx    Pancreatic cancer Neg Hx    Rectal cancer Neg Hx    Stomach cancer Neg Hx    Social History   Socioeconomic History   Marital status: Married    Spouse name: Not on file   Number of children: 2   Years of education: Not on file   Highest education level: Not on file  Occupational History   Occupation: Retired  Tobacco Use   Smoking status: Former    Packs/day: 2.00    Years: 30.00    Pack years: 60.00    Types: Cigarettes    Quit date: 08/29/2006    Years since quitting: 14.1   Smokeless tobacco: Never  Vaping Use   Vaping Use: Never used  Substance and Sexual Activity   Alcohol use: No   Drug use: No   Sexual activity: Yes  Other Topics Concern   Not on file  Social History Narrative   Diet: regular, lots of vegetables-----exercise : none but active   Caffeine use:  5 cups coffee daily         Social Determinants of Health   Financial Resource Strain: Not on file  Food Insecurity: Not on file  Transportation Needs: Not on file  Physical Activity:  Not on file  Stress: Not on file  Social Connections: Not on file    Tobacco Counseling Counseling given: Not Answered   Clinical Intake:                 Diabetic?No         Activities of Daily Living No flowsheet data found.  Patient Care Team: Debbrah Alar, NP as PCP - General (Internal Medicine)  Indicate any recent Medical Services you may have received from other than [facility] providers in the past year (date may be approximate).     Assessment:   This is a routine wellness examination for Gerald [name].  Hearing/Vision screen No results found.  Dietary issues and exercise activities discussed:     Goals Addressed   None    Depression Screen PHQ 2/9 Scores 04/28/2019 04/20/2018 04/13/2017 04/04/2016 09/26/2015 03/19/2015 03/19/2015  PHQ - 2 Score 0 0 0 0 0 0 0    Fall Risk Fall Risk  04/28/2019 04/20/2018 04/13/2017 04/04/2016 09/26/2015  Falls in the past year? 0 0 No No No  Number falls in past yr: 0 - - - -  Injury with Fall? 0 - - - -  Follow up Education provided;Falls prevention discussed - - - -    FALL RISK PREVENTION PERTAINING TO THE HOME:  Any stairs in or around the home? {YES/NO:21197} If so, are there any without handrails? {YES/NO:21197} Home free of loose throw rugs in walkways, pet beds, electrical cords, etc? {YES/NO:21197} Adequate lighting in your home to reduce risk of falls? {YES/NO:21197}  ASSISTIVE DEVICES UTILIZED TO PREVENT FALLS:  Life alert? {YES/NO:21197} Use of a cane, walker or w/c? {YES/NO:21197} Grab bars in the bathroom? {YES/NO:21197} Shower chair or bench in shower? {YES/NO:21197} Elevated toilet seat or a handicapped toilet? {YES/NO:21197}  TIMED UP AND GO:  Was the test performed? {YES/NO:21197}.  Length of time to ambulate 10 feet: *** sec.   {Appearance of JJOA:4166063}  Cognitive Function: MMSE - Mini Mental State Exam 04/13/2017 04/04/2016  Orientation to time 5 5  Orientation to Place 5 5   Registration 3 3  Attention/ Calculation 5 5  Recall 3 2  Language- name  2 objects 2 2  Language- repeat 1 1  Language- follow 3 step command 3 3  Language- read & follow direction 1 1  Write a sentence 1 1  Copy design 1 1  Total score 30 29        Immunizations Immunization History  Administered Date(s) Administered   Influenza Split 12/30/2010, 01/06/2012, 02/03/2017   Influenza Whole 02/18/2007, 03/13/2009, 01/31/2010   Influenza, High Dose Seasonal PF 01/29/2017, 12/29/2017, 12/14/2018   Influenza-Unspecified 01/29/2013, 12/29/2013, 01/15/2016   PFIZER(Purple Top)SARS-COV-2 Vaccination 05/14/2019, 06/06/2019   Pneumococcal Conjugate-13 03/15/2014   Pneumococcal Polysaccharide-23 05/29/2005, 01/06/2012   Td 04/20/2001, 03/15/2014   Zoster, Live 08/27/2011    TDAP status: Up to date  Flu Vaccine status: Up to date  Pneumococcal vaccine status: Up to date  {Covid-19 vaccine status:2101808}  Qualifies for Shingles Vaccine? Yes   Zostavax completed Yes   Shingrix Completed?: No.    Education has been provided regarding the importance of this vaccine. Patient has been advised to call insurance company to determine out of pocket expense if they have not yet received this vaccine. Advised may also receive vaccine at local pharmacy or Health Dept. Verbalized acceptance and understanding.  Screening Tests Health Maintenance  Topic Date Due   Zoster Vaccines- Shingrix (1 of 2) Never done   COVID-19 Vaccine (3 - Pfizer risk series) 07/04/2019   INFLUENZA VACCINE  10/29/2020   TETANUS/TDAP  03/15/2024   COLONOSCOPY (Pts 45-63yrs Insurance coverage will need to be confirmed)  03/28/2027   Hepatitis C Screening  Completed   PNA vac Low Risk Adult  Completed   HPV VACCINES  Aged Out    Health Maintenance  Health Maintenance Due  Topic Date Due   Zoster Vaccines- Shingrix (1 of 2) Never done   COVID-19 Vaccine (3 - Pfizer risk series) 07/04/2019    Colorectal cancer  screening: No longer required.   Lung Cancer Screening: (Low Dose CT Chest recommended if Age 38-80 years, 30 pack-year currently smoking OR have quit w/in 15years.) does not qualify.     Additional Screening:  Hepatitis C Screening: Completed 03/16/2015  Vision Screening: Recommended annual ophthalmology exams for early detection of glaucoma and other disorders of the eye. Is the patient up to date with their annual eye exam?  {YES/NO:21197} Who is the provider or what is the name of the office in which the patient attends annual eye exams? *** If pt is not established with a provider, would they like to be referred to a provider to establish care? {YES/NO:21197}.   Dental Screening: Recommended annual dental exams for proper oral hygiene  Community Resource Referral / Chronic Care Management: CRR required this visit?  {YES/NO:21197}  CCM required this visit?  {YES/NO:21197}     Plan:     I have personally reviewed and noted the following in the patient's chart:   Medical and social history Use of alcohol, tobacco or illicit drugs  Current medications and supplements including opioid prescriptions. {Opioid [phone]} Functional ability and status Nutritional status Physical activity Advanced directives List of other physicians Hospitalizations, surgeries, and ER visits in previous 12 months Vitals Screenings to include cognitive, depression, and falls Referrals and appointments  In addition, I have reviewed and discussed with patient certain preventive protocols, quality metrics, and best practice recommendations. A written personalized care plan for preventive services as well as general preventive health recommendations were provided to patient.   Due to this being a telephonic visit, the after visit summary with patients  personalized plan was offered to patient via mail or my-chart. ***Patient declined at this time./ Patient would like to access on  my-chart/ per request, patient was mailed a copy of AVS./ Patient preferred to pick up at office at next visit.   Marta Antu, LPN   6/38/1771  Nurse Health Advisor  Nurse Notes: ***

## 2020-10-12 DIAGNOSIS — R972 Elevated prostate specific antigen [PSA]: Secondary | ICD-10-CM | POA: Diagnosis not present

## 2020-10-19 DIAGNOSIS — N35919 Unspecified urethral stricture, male, unspecified site: Secondary | ICD-10-CM | POA: Diagnosis not present

## 2020-10-19 DIAGNOSIS — N401 Enlarged prostate with lower urinary tract symptoms: Secondary | ICD-10-CM | POA: Diagnosis not present

## 2020-10-19 DIAGNOSIS — R351 Nocturia: Secondary | ICD-10-CM | POA: Diagnosis not present

## 2020-10-19 DIAGNOSIS — R3912 Poor urinary stream: Secondary | ICD-10-CM | POA: Diagnosis not present

## 2020-10-22 ENCOUNTER — Telehealth: Payer: Self-pay | Admitting: Family

## 2020-10-22 NOTE — Telephone Encounter (Signed)
Copied from [place] [id number]. Topic: Medicare AWV >> Oct 22, 2020  9:22 AM Harris-Coley, Hannah Beat wrote: Reason for CRM: Left message for patient to re-schedule Annual Wellness Visit.  Rescheduled date is for July 28th at 2:20pm.  Please verify appt change. khc

## 2020-10-23 ENCOUNTER — Ambulatory Visit: Payer: Medicare Other

## 2020-10-25 ENCOUNTER — Ambulatory Visit (INDEPENDENT_AMBULATORY_CARE_PROVIDER_SITE_OTHER): Payer: Medicare Other

## 2020-10-25 VITALS — Ht 67.0 in | Wt 178.0 lb

## 2020-10-25 DIAGNOSIS — Z0000 Encounter for general adult medical examination without abnormal findings: Secondary | ICD-10-CM | POA: Diagnosis not present

## 2020-10-25 NOTE — Progress Notes (Signed)
Subjective:   Gerald [name] is a 75 y.o. male who presents for Medicare Annual/Subsequent preventive examination.  I connected with Kevn today by telephone and verified that I am speaking with the correct person using two identifiers. Location patient: home Location provider: work Persons participating in the virtual visit: patient, Marine scientist.    I discussed the limitations, risks, security and privacy concerns of performing an evaluation and management service by telephone and the availability of in person appointments. I also discussed with the patient that there may be a patient responsible charge related to this service. The patient expressed understanding and verbally consented to this telephonic visit.    Interactive audio and video telecommunications were attempted between this provider and patient, however failed, due to patient having technical difficulties OR patient did not have access to video capability.  We continued and completed visit with audio only.  Some vital signs may be absent or patient reported.   Time Spent with patient on telephone encounter: 20 minutes   Review of Systems     Cardiac Risk Factors include: advanced age (>4mn, >>21women);male gender;dyslipidemia;hypertension     Objective:    Today's Vitals   10/25/20 1420  Weight: 178 lb (80.7 kg)  Height: '5\' 7"'$  (1.702 m)   Body mass index is 27.88 kg/m.  Advanced Directives 10/25/2020 04/28/2019 04/20/2018 10/27/2017 09/14/2017 04/13/2017 07/29/2016  Does Patient Have a Medical Advance Directive? Yes Yes Yes Yes Yes Yes Yes  Type of AParamedicof ABig Bear LakeLiving will HDixon Lane-[place]Living will Living will;Healthcare Power of ASan Antonio HeightsLiving will [place]Living will  Does patient want to make changes to medical advance directive? - No - Patient declined - No - Patient declined No - Patient declined No - Patient  declined No - Patient declined  Copy of HTrentonin Chart? Yes - validated most recent copy scanned in chart (See row information) No - copy requested No - copy requested - - No - copy requested No - copy requested    Current Medications (verified) Outpatient Encounter Medications as of 10/25/2020  Medication Sig   aspirin EC 81 MG tablet Take 1 tablet (81 mg total) by mouth daily.   Ciclopirox 1 % shampoo APP TO SCALP UTD AND PRN   Glycerin-Polysorbate 80 (REFRESH DRY EYE THERAPY OP) Apply to eye as needed.   guaiFENesin-codeine (CHERATUSSIN AC) 100-10 MG/5ML syrup Take 5 mLs by mouth 3 (three) times daily as needed for cough.   ketoconazole (NIZORAL) 2 % shampoo Apply 1 application topically as needed for irritation. For eczema   losartan (COZAAR) 100 MG tablet TAKE ONE TABLET BY MOUTH ONE TIME DAILY   Magnesium Malate 1250 (141.7 Mg) MG TABS Take 1 tablet by mouth 3 (three) times a week.   metoprolol tartrate (LOPRESSOR) 25 MG tablet TAKE ONE TABLET BY MOUTH TWICE A DAY   Multiple Vitamins-Minerals (MULTIVITAMIN ADULTS 50+ PO) Take 1 tablet by mouth daily.   Omega-3 Fatty Acids (FISH OIL) 1200 MG CAPS Take 1 capsule by mouth daily.   rosuvastatin (CRESTOR) 5 MG tablet TAKE ONE TABLET BY MOUTH ONE TIME DAILY   meclizine (ANTIVERT) 25 MG tablet Take 25 mg by mouth 3 (three) times daily as needed for dizziness.   nitroGLYCERIN (NITROSTAT) 0.4 MG SL tablet Place 1 tablet (0.4 mg total) under the tongue every 5 (five) minutes as needed for chest pain.   tadalafil (CIALIS) 20 MG tablet TAKE  ONE TABLET BY MOUTH ONE TIME DAILY AS NEEDED   No facility-administered encounter medications on file as of 10/25/2020.    Allergies (verified) Augmentin [amoxicillin-pot clavulanate], Tessalon [benzonatate], and Penicillins   History: Past Medical History:  Diagnosis Date   APC (atrial premature contractions) 10/18/2018   Back pain, chronic    Benign localized prostatic hyperplasia  with lower urinary tract symptoms (LUTS)    Bladder outlet obstruction    Bleeding from urethra in male 10/26/2017   3 times over 7 hours   Chest pain in adult 10/18/2018   Depression    Diverticulosis of colon    Elevated PSA    Fatty liver    GERD (gastroesophageal reflux disease)    Hiatal hernia    History of alcohol abuse    quit 1989   History of esophageal stricture    2003  S/P  DILATATION   History of esophagitis    History of melanoma in situ    06/ 2017  REMOVAL RIGHT CHEEK AREA   History of sepsis 06/06/2016   admitted for urosepsis , UTI due to pseudomonas-- resolved    HTN (hypertension) 09/01/2011   Hx of adenomatous colonic polyps    2003   TUBULAR ADENOMA   Hyperlipidemia    Hypertension    Hypertensive left ventricular hypertrophy, without heart failure 06/05/2015   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   Mild sleep apnea    2004 mild, per sleep study-- no cpap recommended  (pt denies)   Past Surgical History:  Procedure Laterality Date   CARDIOVASCULAR STRESS TEST  04-17-2015  dr Claiborne Billings   Normal stress nulcear study w/ a small, mild, fixed inferior defect consistent with inferior thinning; no ischemia; normal LV function and wall motion , stress ef 56%   COLONOSCOPY  last one 12-02-2006   CYSTOSCOPY WITH URETHRAL DILATATION N/A 10/27/2017   Procedure: CYSTOSCOPY WITH URETHRAL DILATATION;  Surgeon: Ceasar Mons, MD;  [facility];  Service: Urology;  Laterality: N/A;  ONLY NEEDS 90 MIN FOR ALL PROCEDURES   EXCISION LEFT ARM MASS  1988   MELANOMA EXCISION     MOHS SURGERY  09/06/2015   right parotidemomasseteric excision w/ complex repair for melanoma in situ   THULIUM LASER TURP (TRANSURETHRAL RESECTION OF PROSTATE) N/A 07/29/2016   Procedure: Marcelino Duster LASER TURP (TRANSURETHRAL RESECTION OF PROSTATE)/ TRANSRECTAL ULTRASOUND GUIDED PROSTATE BIOPSY;  Surgeon: Nickie Retort, MD;  Location: [facility];  Service:  Urology;  Laterality: N/A;   TONSILLECTOMY AND ADENOIDECTOMY  child   TRANSTHORACIC ECHOCARDIOGRAM  04/16/2015   moderate LVH,  ef [id number], grade 1 diastolic dysfunction/  trivial AR, MR and PR/  mild dilated ascending aorta/  mild TR   TRANSURETHRAL RESECTION OF PROSTATE N/A 10/27/2017   Procedure: TRANSURETHRAL RESECTION OF THE PROSTATE (TURP);  Surgeon: Ceasar Mons, MD;  Location: [facility];  Service: Urology;  Laterality: N/A;   UPPER GASTROINTESTINAL ENDOSCOPY  last one 02-15-2014   Family History  Problem Relation Age of Onset   Coronary artery disease Father    Colon cancer Father        ? unknown age dx/died at 1   Heart attack Paternal Grandfather    Coronary artery disease Maternal Grandmother    Colon cancer Maternal Grandmother 79   Cancer Maternal Grandmother        colon   Melanoma Mother    Coronary artery disease Paternal Grandmother    Heart attack Maternal  Grandfather    Hypertension Maternal Aunt    Prostate cancer Neg Hx    Stroke Neg Hx    Esophageal cancer Neg Hx    Pancreatic cancer Neg Hx    Rectal cancer Neg Hx    Stomach cancer Neg Hx    Social History   Socioeconomic History   Marital status: Married    Spouse name: Not on file   Number of children: 2   Years of education: Not on file   Highest education level: Not on file  Occupational History   Occupation: Retired  Tobacco Use   Smoking status: Former    Packs/day: 2.00    Years: 30.00    Pack years: 60.00    Types: Cigarettes    Quit date: 08/29/2006    Years since quitting: 14.1   Smokeless tobacco: Never  Vaping Use   Vaping Use: Never used  Substance and Sexual Activity   Alcohol use: No   Drug use: No   Sexual activity: Yes  Other Topics Concern   Not on file  Social History Narrative   Diet: regular, lots of vegetables-----exercise : none but active   Caffeine use:  5 cups coffee daily         Social Determinants of Health   Financial  Resource Strain: Low Risk    Difficulty of Paying Living Expenses: Not hard at all  Food Insecurity: No Food Insecurity   Worried About Charity fundraiser in the Last Year: Never true   Ran Out of Food in the Last Year: Never true  Transportation Needs: No Transportation Needs   Lack of Transportation (Medical): No   Lack of Transportation (Non-Medical): No  Physical Activity: Insufficiently Active   Days of Exercise per Week: 5 days   Minutes of Exercise per Session: 10 min  Stress: No Stress Concern Present   Feeling of Stress : Not at all  Social Connections: Socially Integrated   Frequency of Communication with Friends and Family: More than three times a week   Frequency of Social Gatherings with Friends and Family: More than three times a week   Attends Religious Services: More than 4 times per year   Active Member of [employer] or Organizations: Yes   Attends Music therapist: More than 4 times per year   Marital Status: Married    Tobacco Counseling Counseling given: Not Answered   Clinical Intake:  Pre-visit preparation completed: Yes  Pain : No/denies pain     Nutritional Status: BMI 25 -29 Overweight Nutritional Risks: None Diabetes: No  How often do you need to have someone help you when you read instructions, pamphlets, or other written materials from your doctor or pharmacy?: 1 - Never  Diabetic?No  Interpreter Needed?: No  Information entered by :: Caroleen Hamman LPN   Activities of Daily Living In your present state of health, do you have any difficulty performing the following activities: 10/25/2020  Hearing? N  Vision? N  Difficulty concentrating or making decisions? N  Walking or climbing stairs? N  Dressing or bathing? N  Doing errands, shopping? N  Preparing Food and eating ? N  Using the Toilet? N  In the past six months, have you accidently leaked urine? Y  Comment sees urologist  Do you have problems with loss of bowel control?  N  Managing your Medications? N  Managing your Finances? N  Housekeeping or managing your Housekeeping? N  Some recent data might be hidden  Patient Care Team: Debbrah Alar, NP as PCP - General (Internal Medicine)  Indicate any recent Medical Services you may have received from other than [facility] providers in the past year (date may be approximate).     Assessment:   This is a routine wellness examination for Hud.  Hearing/Vision screen Hearing Screening - Comments:: No issues Vision Screening - Comments:: Reading glasses Last eye exam-3 months ago-Dr. Orvan Seen  Dietary issues and exercise activities discussed: Current Exercise Habits: Home exercise routine, Type of exercise: walking, Time (Minutes): 15, Frequency (Times/Week): 5, Weekly Exercise (Minutes/Week): 75, Intensity: Mild, Exercise limited by: None identified   Goals Addressed             This Visit's Progress    Increase physical activity   On track    Walk 33mn 3 days/ week        Depression Screen PHQ 2/9 Scores 10/25/2020 04/28/2019 04/20/2018 04/13/2017 04/04/2016 09/26/2015 03/19/2015  PHQ - 2 Score 0 0 0 0 0 0 0    Fall Risk Fall Risk  10/25/2020 04/28/2019 04/20/2018 04/13/2017 04/04/2016  Falls in the past year? 0 0 0 No No  Number falls in past yr: 0 0 - - -  Injury with Fall? 0 0 - - -  Follow up Falls prevention discussed Education provided;Falls prevention discussed - - -    FALL RISK PREVENTION PERTAINING TO THE HOME:  Any stairs in or around the home? No  Home free of loose throw rugs in walkways, pet beds, electrical cords, etc? No  Adequate lighting in your home to reduce risk of falls? No   ASSISTIVE DEVICES UTILIZED TO PREVENT FALLS:  Life alert? No  Use of a cane, walker or w/c? No  Grab bars in the bathroom? Yes  Shower chair or bench in shower? No  Elevated toilet seat or a handicapped toilet? No   TIMED UP AND GO:  Was the test performed? No . Phone visit   Cognitive  Function:Normal cognitive status assessed by  this Nurse Health Advisor. No abnormalities found.   MMSE - Mini Mental State Exam 04/13/2017 04/04/2016  Orientation to time 5 5  Orientation to Place 5 5  Registration 3 3  Attention/ Calculation 5 5  Recall 3 2  Language- name 2 objects 2 2  Language- repeat 1 1  Language- follow 3 step command 3 3  Language- read & follow direction 1 1  Write a sentence 1 1  Copy design 1 1  Total score 30 29        Immunizations Immunization History  Administered Date(s) Administered   Influenza Split 12/30/2010, 01/06/2012, 02/03/2017   Influenza Whole 02/18/2007, 03/13/2009, 01/31/2010   Influenza, High Dose Seasonal PF 01/29/2017, 12/29/2017, 12/14/2018   Influenza-Unspecified 01/29/2013, 12/29/2013, 01/15/2016   PFIZER(Purple Top)SARS-COV-2 Vaccination 05/14/2019, 06/06/2019, 01/19/2020   Pneumococcal Conjugate-13 03/15/2014   Pneumococcal Polysaccharide-23 05/29/2005, 01/06/2012   Td 04/20/2001, 03/15/2014   Zoster, Live 08/27/2011    TDAP status: Up to date  Flu Vaccine status: Up to date  Pneumococcal vaccine status: Up to date  Covid-19 vaccine status: Completed vaccines  Qualifies for Shingles Vaccine? Yes   Zostavax completed Yes   Shingrix Completed?: No.    Education has been provided regarding the importance of this vaccine. Patient has been advised to call insurance company to determine out of pocket expense if they have not yet received this vaccine. Advised may also receive vaccine at local pharmacy or Health Dept. Verbalized acceptance and understanding.  Screening Tests Health Maintenance  Topic Date Due   Zoster Vaccines- Shingrix (1 of 2) Never done   COVID-19 Vaccine (4 - Booster for Pfizer series) 04/20/2020   INFLUENZA VACCINE  10/29/2020   TETANUS/TDAP  03/15/2024   COLONOSCOPY (Pts 45-109yr Insurance coverage will need to be confirmed)  03/28/2027   Hepatitis C Screening  Completed   PNA vac Low Risk Adult   Completed   HPV VACCINES  Aged Out    Health Maintenance  Health Maintenance Due  Topic Date Due   Zoster Vaccines- Shingrix (1 of 2) Never done   COVID-19 Vaccine (4 - Booster for Pfizer series) 04/20/2020    Colorectal cancer screening: Type of screening: Colonoscopy. Completed 03/27/2017. Repeat every 10 years  Lung Cancer Screening: (Low Dose CT Chest recommended if Age 74-80years, 30 pack-year currently smoking OR have quit w/in 15years.) does qualify.   Lung Cancer Screening Referral: Declined today  Additional Screening:  Hepatitis C Screening:  Completed 03/19/2015  Vision Screening: Recommended annual ophthalmology exams for early detection of glaucoma and other disorders of the eye. Is the patient up to date with their annual eye exam?  Yes  Who is the provider or what is the name of the office in which the patient attends annual eye exams? Dr. AOrvan Seen  Dental Screening: Recommended annual dental exams for proper oral hygiene  Community Resource Referral / Chronic Care Management: CRR required this visit?  No   CCM required this visit?  No      Plan:     I have personally reviewed and noted the following in the patient's chart:   Medical and social history Use of alcohol, tobacco or illicit drugs  Current medications and supplements including opioid prescriptions. Patient is not currently taking opioid prescriptions. Functional ability and status Nutritional status Physical activity Advanced directives List of other physicians Hospitalizations, surgeries, and ER visits in previous 12 months Vitals Screenings to include cognitive, depression, and falls Referrals and appointments  In addition, I have reviewed and discussed with patient certain preventive protocols, quality metrics, and best practice recommendations. A written personalized care plan for preventive services as well as general preventive health recommendations were provided to patient.    Due to this being a telephonic visit, the after visit summary with patients personalized plan was offered to patient via mail or my-chart.  Patient would like to access on my-chart.   MMarta Antu LPN   [id number] Nurse health Advisor  Nurse Notes: None

## 2020-10-25 NOTE — Patient Instructions (Signed)
Gerald [name] , Thank you for taking time to complete your Medicare Wellness Visit. I appreciate your ongoing commitment to your health goals. Please review the following plan we discussed and let me know if I can assist you in the future.   Screening recommendations/referrals: Colonoscopy: Completed 03/27/2017-Due 03/18/2027 Recommended yearly ophthalmology/optometry visit for glaucoma screening and checkup Recommended yearly dental visit for hygiene and checkup  Vaccinations: Influenza vaccine: Due 11/2020 Pneumococcal vaccine: Up to date Tdap vaccine: Up to date-Due 03/15/2024 Shingles vaccine: Discuss with pharmacy   Covid-19 Up to date  Advanced directives: Copy in chart  Conditions/risks identified: See problem list  Next appointment: Follow up in one year for your annual wellness visit. 10/31/2021 @ 3:00  Preventive Care 65 Years and Older, Male Preventive care refers to lifestyle choices and visits with your health care provider that can promote health and wellness. What does preventive care include? A yearly physical exam. This is also called an annual well check. Dental exams once or twice a year. Routine eye exams. Ask your health care provider how often you should have your eyes checked. Personal lifestyle choices, including: Daily care of your teeth and gums. Regular physical activity. Eating a healthy diet. Avoiding tobacco and drug use. Limiting alcohol use. Practicing safe sex. Taking low doses of aspirin every day. Taking vitamin and mineral supplements as recommended by your health care provider. What happens during an annual well check? The services and screenings done by your health care provider during your annual well check will depend on your age, overall health, lifestyle risk factors, and family history of disease. Counseling  Your health care provider may ask you questions about your: Alcohol use. Tobacco use. Drug use. Emotional well-being. Home and  relationship well-being. Sexual activity. Eating habits. History of falls. Memory and ability to understand (cognition). Work and work Statistician. Screening  You may have the following tests or measurements: Height, weight, and BMI. Blood pressure. Lipid and cholesterol levels. These may be checked every 5 years, or more frequently if you are over 30 years old. Skin check. Lung cancer screening. You may have this screening every year starting at age 74 if you have a 30-pack-year history of smoking and currently smoke or have quit within the past 15 years. Fecal occult blood test (FOBT) of the stool. You may have this test every year starting at age 74. Flexible sigmoidoscopy or colonoscopy. You may have a sigmoidoscopy every 5 years or a colonoscopy every 10 years starting at age 74. Prostate cancer screening. Recommendations will vary depending on your family history and other risks. Hepatitis C blood test. Hepatitis B blood test. Sexually transmitted disease (STD) testing. Diabetes screening. This is done by checking your blood sugar (glucose) after you have not eaten for a while (fasting). You may have this done every 1-3 years. Abdominal aortic aneurysm (AAA) screening. You may need this if you are a current or former smoker. Osteoporosis. You may be screened starting at age 74 if you are at high risk. Talk with your health care provider about your test results, treatment options, and if necessary, the need for more tests. Vaccines  Your health care provider may recommend certain vaccines, such as: Influenza vaccine. This is recommended every year. Tetanus, diphtheria, and acellular pertussis (Tdap, Td) vaccine. You may need a Td booster every 10 years. Zoster vaccine. You may need this after age 60. Pneumococcal 13-valent conjugate (PCV13) vaccine. One dose is recommended after age 71. Pneumococcal polysaccharide (PPSV23) vaccine. One dose  is recommended after age 74. Talk to your  health care provider about which screenings and vaccines you need and how often you need them. This information is not intended to replace advice given to you by your health care provider. Make sure you discuss any questions you have with your health care provider. Document Released: 04/13/2015 Document Revised: 12/05/2015 Document Reviewed: 01/16/2015 Elsevier Interactive Patient Education  2017 [place] Prevention in the Home Falls can cause injuries. They can happen to people of all ages. There are many things you can do to make your home safe and to help prevent falls. What can I do on the outside of my home? Regularly fix the edges of walkways and driveways and fix any cracks. Remove anything that might make you trip as you walk through a door, such as a raised step or threshold. Trim any bushes or trees on the path to your home. Use bright outdoor lighting. Clear any walking paths of anything that might make someone trip, such as rocks or tools. Regularly check to see if handrails are loose or broken. Make sure that both sides of any steps have handrails. Any raised decks and porches should have guardrails on the edges. Have any leaves, snow, or ice cleared regularly. Use sand or salt on walking paths during winter. Clean up any spills in your garage right away. This includes oil or grease spills. What can I do in the bathroom? Use night lights. Install grab bars by the toilet and in the tub and shower. Do not use towel bars as grab bars. Use non-skid mats or decals in the tub or shower. If you need to sit down in the shower, use a plastic, non-slip stool. Keep the floor dry. Clean up any water that spills on the floor as soon as it happens. Remove soap buildup in the tub or shower regularly. Attach bath mats securely with double-sided non-slip rug tape. Do not have throw rugs and other things on the floor that can make you trip. What can I do in the bedroom? Use night  lights. Make sure that you have a light by your bed that is easy to reach. Do not use any sheets or blankets that are too big for your bed. They should not hang down onto the floor. Have a firm chair that has side arms. You can use this for support while you get dressed. Do not have throw rugs and other things on the floor that can make you trip. What can I do in the kitchen? Clean up any spills right away. Avoid walking on wet floors. Keep items that you use a lot in easy-to-reach places. If you need to reach something above you, use a strong step stool that has a grab bar. Keep electrical cords out of the way. Do not use floor polish or wax that makes floors slippery. If you must use wax, use non-skid floor wax. Do not have throw rugs and other things on the floor that can make you trip. What can I do with my stairs? Do not leave any items on the stairs. Make sure that there are handrails on both sides of the stairs and use them. Fix handrails that are broken or loose. Make sure that handrails are as long as the stairways. Check any carpeting to make sure that it is firmly attached to the stairs. Fix any carpet that is loose or worn. Avoid having throw rugs at the top or bottom of the stairs.  If you do have throw rugs, attach them to the floor with carpet tape. Make sure that you have a light switch at the top of the stairs and the bottom of the stairs. If you do not have them, ask someone to add them for you. What else can I do to help prevent falls? Wear shoes that: Do not have high heels. Have rubber bottoms. Are comfortable and fit you well. Are closed at the toe. Do not wear sandals. If you use a stepladder: Make sure that it is fully opened. Do not climb a closed stepladder. Make sure that both sides of the stepladder are locked into place. Ask someone to hold it for you, if possible. Clearly mark and make sure that you can see: Any grab bars or handrails. First and last  steps. Where the edge of each step is. Use tools that help you move around (mobility aids) if they are needed. These include: Canes. Walkers. Scooters. Crutches. Turn on the lights when you go into a dark area. Replace any light bulbs as soon as they burn out. Set up your furniture so you have a clear path. Avoid moving your furniture around. If any of your floors are uneven, fix them. If there are any pets around you, be aware of where they are. Review your medicines with your doctor. Some medicines can make you feel dizzy. This can increase your chance of falling. Ask your doctor what other things that you can do to help prevent falls. This information is not intended to replace advice given to you by your health care provider. Make sure you discuss any questions you have with your health care provider. Document Released: 01/11/2009 Document Revised: 08/23/2015 Document Reviewed: 04/21/2014 Elsevier Interactive Patient Education  2017 [employer].

## 2020-11-03 ENCOUNTER — Other Ambulatory Visit: Payer: Self-pay | Admitting: Family

## 2020-11-06 DIAGNOSIS — L814 Other melanin hyperpigmentation: Secondary | ICD-10-CM | POA: Diagnosis not present

## 2020-11-06 DIAGNOSIS — D225 Melanocytic nevi of trunk: Secondary | ICD-10-CM | POA: Diagnosis not present

## 2020-11-06 DIAGNOSIS — L821 Other seborrheic keratosis: Secondary | ICD-10-CM | POA: Diagnosis not present

## 2020-11-06 DIAGNOSIS — Z85820 Personal history of malignant melanoma of skin: Secondary | ICD-10-CM | POA: Diagnosis not present

## 2020-11-06 DIAGNOSIS — D2261 Melanocytic nevi of right upper limb, including shoulder: Secondary | ICD-10-CM | POA: Diagnosis not present

## 2020-11-06 DIAGNOSIS — L720 Epidermal cyst: Secondary | ICD-10-CM | POA: Diagnosis not present

## 2020-11-06 DIAGNOSIS — L918 Other hypertrophic disorders of the skin: Secondary | ICD-10-CM | POA: Diagnosis not present

## 2020-11-06 DIAGNOSIS — D1801 Hemangioma of skin and subcutaneous tissue: Secondary | ICD-10-CM | POA: Diagnosis not present

## 2020-11-19 ENCOUNTER — Ambulatory Visit (INDEPENDENT_AMBULATORY_CARE_PROVIDER_SITE_OTHER): Payer: Medicare Other | Admitting: Family

## 2020-11-19 ENCOUNTER — Other Ambulatory Visit: Payer: Self-pay

## 2020-11-19 VITALS — BP 125/66 | HR 62 | Temp 98.6°F | Resp 16 | Wt 182.0 lb

## 2020-11-19 DIAGNOSIS — E782 Mixed hyperlipidemia: Secondary | ICD-10-CM | POA: Diagnosis not present

## 2020-11-19 DIAGNOSIS — I10 Essential (primary) hypertension: Secondary | ICD-10-CM | POA: Diagnosis not present

## 2020-11-19 DIAGNOSIS — I25118 Atherosclerotic heart disease of native coronary artery with other forms of angina pectoris: Secondary | ICD-10-CM | POA: Diagnosis not present

## 2020-11-19 DIAGNOSIS — E785 Hyperlipidemia, unspecified: Secondary | ICD-10-CM | POA: Diagnosis not present

## 2020-11-19 MED ORDER — METOPROLOL TARTRATE 25 MG PO TABS: 25.0000 mg | ORAL_TABLET | Freq: Two times a day (BID) | ORAL | 1 refills | Status: DC

## 2020-11-19 MED ORDER — ROSUVASTATIN CALCIUM 5 MG PO TABS: 5.0000 mg | ORAL_TABLET | Freq: Every day | ORAL | 1 refills | Status: DC

## 2020-11-19 MED ORDER — LOSARTAN POTASSIUM 100 MG PO TABS: 100.0000 mg | ORAL_TABLET | Freq: Every day | ORAL | 1 refills | Status: DC

## 2020-11-19 NOTE — Assessment & Plan Note (Signed)
bp at goal. Continue losartan '100mg'$  and metoprolol '50mg'$  bid.

## 2020-11-19 NOTE — Patient Instructions (Signed)
Please complete lab work.  

## 2020-11-19 NOTE — Progress Notes (Signed)
Subjective:   By signing my name below, I, Shehryar Baig, attest that this documentation has been prepared under the direction and in the presence of Sandford Craze NP. 11/19/2020     Patient ID: Gerald [name], male    DOB: March 20, 1946, 75 y.o.   MRN: [id number]  No chief complaint on file.   HPI Patient is in today for a office visit.  Blood pressure- His blood pressure is doing well during this visit. He continues taking 100 mg losartan daily PO, 25 mg metoprolol tartrate daily PO and reports no new issues while taking it.   BP Readings from Last 3 Encounters:  11/19/20 125/66  04/24/20 132/70  06/28/19 130/63   Cholesterol- He is requesting to check his cholesterol on his next lab work. He continues taking 5 mg Crestor daily PO and reports no new issues while taking it.   Lab Results  Component Value Date   CHOL 150 04/24/2020   HDL 42 04/24/2020   LDLCALC 65 04/24/2020   LDLDIRECT 88.0 09/17/2016   TRIG 266 (H) 04/24/2020   CHOLHDL 3.6 04/24/2020   Covid-19- He recently recovered from Covid-19. He reports that he had flu like symptoms. Reflux- He no longer has episodes of reflux.  Erectile dysfunction- He no longer takes 20 mg Cialis daily PO ans switched to a generic medication.  Immunizations- He has 2 Covid-19 vaccines at this time and is interested in getting the new booster vaccine after it releases in the fall season.    Health Maintenance Due  Topic Date Due   Zoster Vaccines- Shingrix (1 of 2) Never done   COVID-19 Vaccine (4 - Booster for [employer] series) 04/20/2020   INFLUENZA VACCINE  10/29/2020    Past Medical History:  Diagnosis Date   APC (atrial premature contractions) 10/18/2018   Back pain, chronic    Benign localized prostatic hyperplasia with lower urinary tract symptoms (LUTS)    Bladder outlet obstruction    Bleeding from urethra in male 10/26/2017   3 times over 7 hours   Chest pain in adult 10/18/2018   Depression     Diverticulosis of colon    Elevated PSA    Fatty liver    GERD (gastroesophageal reflux disease)    Hiatal hernia    History of alcohol abuse    quit 1989   History of esophageal stricture    2003  S/P  DILATATION   History of esophagitis    History of melanoma in situ    06/ 2017  REMOVAL RIGHT CHEEK AREA   History of sepsis 06/06/2016   admitted for urosepsis , UTI due to pseudomonas-- resolved    HTN (hypertension) 09/01/2011   Hx of adenomatous colonic polyps    2003   TUBULAR ADENOMA   Hyperlipidemia    Hypertension    Hypertensive left ventricular hypertrophy, without heart failure 06/05/2015   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   Mild sleep apnea    2004 mild, per sleep study-- no cpap recommended  (pt denies)    Past Surgical History:  Procedure Laterality Date   CARDIOVASCULAR STRESS TEST  04-17-2015  dr Tresa Endo   Normal stress nulcear study w/ a small, mild, fixed inferior defect consistent with inferior thinning; no ischemia; normal LV function and wall motion , stress ef 56%   COLONOSCOPY  last one 12-02-2006   CYSTOSCOPY WITH URETHRAL DILATATION N/A 10/27/2017   Procedure: CYSTOSCOPY WITH URETHRAL DILATATION;  Surgeon: Rene Paci, MD;  Location:  [facility];  Service: Urology;  Laterality: N/A;  ONLY NEEDS 90 MIN FOR ALL PROCEDURES   EXCISION LEFT ARM MASS  1988   MELANOMA EXCISION     MOHS SURGERY  09/06/2015   right parotidemomasseteric excision w/ complex repair for melanoma in situ   THULIUM LASER TURP (TRANSURETHRAL RESECTION OF PROSTATE) N/A 07/29/2016   Procedure: Morton Peters LASER TURP (TRANSURETHRAL RESECTION OF PROSTATE)/ TRANSRECTAL ULTRASOUND GUIDED PROSTATE BIOPSY;  Surgeon: Hildred Laser, MD;  Location: [facility];  Service: Urology;  Laterality: N/A;   TONSILLECTOMY AND ADENOIDECTOMY  child   TRANSTHORACIC ECHOCARDIOGRAM  04/16/2015   moderate LVH,  ef 60-65%, grade 1 diastolic dysfunction/  trivial AR, MR and PR/   mild dilated ascending aorta/  mild TR   TRANSURETHRAL RESECTION OF PROSTATE N/A 10/27/2017   Procedure: TRANSURETHRAL RESECTION OF THE PROSTATE (TURP);  Surgeon: Rene Paci, MD;  Location: [facility];  Service: Urology;  Laterality: N/A;   UPPER GASTROINTESTINAL ENDOSCOPY  last one 02-15-2014    Family History  Problem Relation Age of Onset   Coronary artery disease Father    Colon cancer Father        ? unknown age dx/died at 17   Heart attack Paternal Grandfather    Coronary artery disease Maternal Grandmother    Colon cancer Maternal Grandmother 18   Cancer Maternal Grandmother        colon   Melanoma Mother    Coronary artery disease Paternal Grandmother    Heart attack Maternal Grandfather    Hypertension Maternal Aunt    Prostate cancer Neg Hx    Stroke Neg Hx    Esophageal cancer Neg Hx    Pancreatic cancer Neg Hx    Rectal cancer Neg Hx    Stomach cancer Neg Hx     Social History   Socioeconomic History   Marital status: Married    Spouse name: Not on file   Number of children: 2   Years of education: Not on file   Highest education level: Not on file  Occupational History   Occupation: Retired  Tobacco Use   Smoking status: Former    Packs/day: 2.00    Years: 30.00    Pack years: 60.00    Types: Cigarettes    Quit date: 08/29/2006    Years since quitting: 14.2   Smokeless tobacco: Never  Vaping Use   Vaping Use: Never used  Substance and Sexual Activity   Alcohol use: No   Drug use: No   Sexual activity: Yes  Other Topics Concern   Not on file  Social History Narrative   Diet: regular, lots of vegetables-----exercise : none but active   Caffeine use:  5 cups coffee daily         Social Determinants of Health   Financial Resource Strain: Low Risk    Difficulty of Paying Living Expenses: Not hard at all  Food Insecurity: No Food Insecurity   Worried About Programme researcher, broadcasting/film/video in the Last Year: Never true   Ran Out  of Food in the Last Year: Never true  Transportation Needs: No Transportation Needs   Lack of Transportation (Medical): No   Lack of Transportation (Non-Medical): No  Physical Activity: Insufficiently Active   Days of Exercise per Week: 5 days   Minutes of Exercise per Session: 10 min  Stress: No Stress Concern Present   Feeling of Stress : Not at all  Social Connections:  Socially Integrated   Frequency of Communication with Friends and Family: More than three times a week   Frequency of Social Gatherings with Friends and Family: More than three times a week   Attends Religious Services: More than 4 times per year   Active Member of [employer] or Organizations: Yes   Attends Engineer, structural: More than 4 times per year   Marital Status: Married  Catering manager Violence: Not At Risk   Fear of Current or Ex-Partner: No   Emotionally Abused: No   Physically Abused: No   Sexually Abused: No    Outpatient Medications Prior to Visit  Medication Sig Dispense Refill   aspirin EC 81 MG tablet Take 1 tablet (81 mg total) by mouth daily. 90 tablet 3   Ciclopirox 1 % shampoo APP TO SCALP UTD AND PRN  2   Glycerin-Polysorbate 80 (REFRESH DRY EYE THERAPY OP) Apply to eye as needed.     guaiFENesin-codeine (CHERATUSSIN AC) 100-10 MG/5ML syrup Take 5 mLs by mouth 3 (three) times daily as needed for cough. 60 mL 0   ketoconazole (NIZORAL) 2 % shampoo Apply 1 application topically as needed for irritation. For eczema     losartan (COZAAR) 100 MG tablet TAKE ONE TABLET BY MOUTH ONE TIME DAILY 30 tablet 1   Magnesium Malate 1250 (141.7 Mg) MG TABS Take 1 tablet by mouth 3 (three) times a week.     meclizine (ANTIVERT) 25 MG tablet Take 25 mg by mouth 3 (three) times daily as needed for dizziness.     metoprolol tartrate (LOPRESSOR) 25 MG tablet TAKE ONE TABLET BY MOUTH TWICE A DAY 60 tablet 1   Multiple Vitamins-Minerals (MULTIVITAMIN ADULTS 50+ PO) Take 1 tablet by mouth daily.      nitroGLYCERIN (NITROSTAT) 0.4 MG SL tablet Place 1 tablet (0.4 mg total) under the tongue every 5 (five) minutes as needed for chest pain. 25 tablet 11   Omega-3 Fatty Acids (FISH OIL) 1200 MG CAPS Take 1 capsule by mouth daily.     rosuvastatin (CRESTOR) 5 MG tablet TAKE ONE TABLET BY MOUTH ONE TIME DAILY 90 tablet 3   tadalafil (CIALIS) 20 MG tablet TAKE ONE TABLET BY MOUTH ONE TIME DAILY AS NEEDED 20 tablet 3   No facility-administered medications prior to visit.    Allergies  Allergen Reactions   Augmentin [Amoxicillin-Pot Clavulanate] Diarrhea    *Can take plain Amoxicillin* Has patient had a PCN reaction causing immediate rash, facial/tongue/throat swelling, SOB or lightheadedness with hypotension: No Has patient had a PCN reaction causing severe rash involving mucus membranes or skin necrosis: No Has patient had a PCN reaction that required hospitalization No Has patient had a PCN reaction occurring within the last 10 years: Yes If all of the above answers are "NO", then may proceed with Cephalosporin use.    Tessalon [Benzonatate]     Deep uncontrollable cough   Penicillins Rash    Childhood reaction *Can take Amoxicillin* Has patient had a PCN reaction causing immediate rash, facial/tongue/throat swelling, SOB or lightheadedness with hypotension: Unknown Has patient had a PCN reaction causing severe rash involving mucus membranes or skin necrosis: Unknown Has patient had a PCN reaction that required hospitalization: Unknown Has patient had a PCN reaction occurring within the last 10 years: Unknown  If all of the above answers are "NO", then may proceed with Cephalosporin use    ROS     Objective:    Physical Exam Constitutional:  General: He is not in acute distress.    Appearance: Normal appearance. He is not ill-appearing.  HENT:     Head: Normocephalic and atraumatic.     Right Ear: External ear normal.     Left Ear: External ear normal.  Eyes:      Extraocular Movements: Extraocular movements intact.     Pupils: Pupils are equal, round, and reactive to light.  Cardiovascular:     Rate and Rhythm: Normal rate and regular rhythm.     Heart sounds: Normal heart sounds. No murmur heard.   No gallop.  Pulmonary:     Effort: Pulmonary effort is normal. No respiratory distress.     Breath sounds: Normal breath sounds. No wheezing or rales.  Skin:    General: Skin is warm and dry.  Neurological:     Mental Status: He is alert and oriented to person, place, and time.  Psychiatric:        Behavior: Behavior normal.    There were no vitals taken for this visit. Wt Readings from Last 3 Encounters:  10/25/20 178 lb (80.7 kg)  04/24/20 182 lb 1.6 oz (82.6 kg)  06/28/19 180 lb (81.6 kg)       Assessment & Plan:   Problem List Items Addressed This Visit   None    No orders of the defined types were placed in this encounter.   I, Sandford Craze NP, personally preformed the services described in this documentation.  All medical record entries made by the scribe were at my direction and in my presence.  I have reviewed the chart and discharge instructions (if applicable) and agree that the record reflects my personal performance and is accurate and complete. 11/19/2020   I,Shehryar Baig,acting as a scribe for Lemont Fillers, NP.,have documented all relevant documentation on the behalf of Lemont Fillers, NP,as directed by  Lemont Fillers, NP while in the presence of Lemont Fillers, NP.   Shehryar [employer]

## 2020-11-19 NOTE — Assessment & Plan Note (Signed)
Tolerating rosuvastatin '5mg'$ . Continue same. Obtain follow up lipid panel.

## 2020-11-19 NOTE — Progress Notes (Signed)
Subjective:     Patient ID: Gerald [name], male    DOB: [birth year], 75 y.o.   MRN: [id number]  Chief Complaint  Patient presents with   Hyperlipidemia    Here for follow up   Hypertension    Here for follow up    HPI  Patient is in today for follow up.    Hyperlipidemia- maintained on crestor 5mg  once daily.  HTN- maintained on losartan 100mg , metoprolol 25mg  bid.    BP Readings from Last 3 Encounters:  11/19/20 125/66  04/24/20 132/70  06/28/19 130/63    Health Maintenance Due  Topic Date Due   Zoster Vaccines- Shingrix (1 of 2) Never done   COVID-19 Vaccine (4 - Booster for [employer] series) 04/20/2020   INFLUENZA VACCINE  10/29/2020    Past Medical History:  Diagnosis Date   APC (atrial premature contractions) 10/18/2018   Back pain, chronic    Benign localized prostatic hyperplasia with lower urinary tract symptoms (LUTS)    Bladder outlet obstruction    Bleeding from urethra in male 10/26/2017   3 times over 7 hours   Chest pain in adult 10/18/2018   Depression    Diverticulosis of colon    Elevated PSA    Fatty liver    GERD (gastroesophageal reflux disease)    Hiatal hernia    History of alcohol abuse    quit 1989   History of esophageal stricture    2003  S/P  DILATATION   History of esophagitis    History of melanoma in situ    06/ 2017  REMOVAL RIGHT CHEEK AREA   History of sepsis 06/06/2016   admitted for urosepsis , UTI due to pseudomonas-- resolved    HTN (hypertension) 09/01/2011   Hx of adenomatous colonic polyps    2003   TUBULAR ADENOMA   Hyperlipidemia    Hypertension    Hypertensive left ventricular hypertrophy, without heart failure 06/05/2015   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   Mild sleep apnea    2004 mild, per sleep study-- no cpap recommended  (pt denies)    Past Surgical History:  Procedure Laterality Date   CARDIOVASCULAR STRESS TEST  04-17-2015  dr Tresa Endo   Normal stress nulcear study w/ a small, mild, fixed inferior  defect consistent with inferior thinning; no ischemia; normal LV function and wall motion , stress ef 56%   COLONOSCOPY  last one 12-02-2006   CYSTOSCOPY WITH URETHRAL DILATATION N/A 10/27/2017   Procedure: CYSTOSCOPY WITH URETHRAL DILATATION;  Surgeon: Rene Paci, MD;  [facility];  Service: Urology;  Laterality: N/A;  ONLY NEEDS 90 MIN FOR ALL PROCEDURES   EXCISION LEFT ARM MASS  1988   MELANOMA EXCISION     MOHS SURGERY  09/06/2015   right parotidemomasseteric excision w/ complex repair for melanoma in situ   THULIUM LASER TURP (TRANSURETHRAL RESECTION OF PROSTATE) N/A 07/29/2016   Procedure: Morton Peters LASER TURP (TRANSURETHRAL RESECTION OF PROSTATE)/ TRANSRECTAL ULTRASOUND GUIDED PROSTATE BIOPSY;  Surgeon: Hildred Laser, MD;  Location: [facility];  Service: Urology;  Laterality: N/A;   TONSILLECTOMY AND ADENOIDECTOMY  child   TRANSTHORACIC ECHOCARDIOGRAM  04/16/2015   moderate LVH,  ef 60-65%, grade 1 diastolic dysfunction/  trivial AR, MR and PR/  mild dilated ascending aorta/  mild TR   TRANSURETHRAL RESECTION OF PROSTATE N/A 10/27/2017   Procedure: TRANSURETHRAL RESECTION OF THE PROSTATE (TURP);  Surgeon: Rene Paci, MD;  Location: [facility];  Service: Urology;  Laterality: N/A;   UPPER GASTROINTESTINAL ENDOSCOPY  last one 02-15-2014    Family History  Problem Relation Age of Onset   Coronary artery disease Father    Colon cancer Father        ? unknown age dx/died at 20   Heart attack Paternal Grandfather    Coronary artery disease Maternal Grandmother    Colon cancer Maternal Grandmother 7   Cancer Maternal Grandmother        colon   Melanoma Mother    Coronary artery disease Paternal Grandmother    Heart attack Maternal Grandfather    Hypertension Maternal Aunt    Prostate cancer Neg Hx    Stroke Neg Hx    Esophageal cancer Neg Hx    Pancreatic cancer Neg Hx    Rectal cancer Neg Hx     Stomach cancer Neg Hx     Social History   Socioeconomic History   Marital status: Married    Spouse name: Not on file   Number of children: 2   Years of education: Not on file   Highest education level: Not on file  Occupational History   Occupation: Retired  Tobacco Use   Smoking status: Former    Packs/day: 2.00    Years: 30.00    Pack years: 60.00    Types: Cigarettes    Quit date: 08/29/2006    Years since quitting: 14.2   Smokeless tobacco: Never  Vaping Use   Vaping Use: Never used  Substance and Sexual Activity   Alcohol use: No   Drug use: No   Sexual activity: Yes  Other Topics Concern   Not on file  Social History Narrative   Diet: regular, lots of vegetables-----exercise : none but active   Caffeine use:  5 cups coffee daily         Social Determinants of Health   Financial Resource Strain: Low Risk    Difficulty of Paying Living Expenses: Not hard at all  Food Insecurity: No Food Insecurity   Worried About Programme researcher, broadcasting/film/video in the Last Year: Never true   Ran Out of Food in the Last Year: Never true  Transportation Needs: No Transportation Needs   Lack of Transportation (Medical): No   Lack of Transportation (Non-Medical): No  Physical Activity: Insufficiently Active   Days of Exercise per Week: 5 days   Minutes of Exercise per Session: 10 min  Stress: No Stress Concern Present   Feeling of Stress : Not at all  Social Connections: Socially Integrated   Frequency of Communication with Friends and Family: More than three times a week   Frequency of Social Gatherings with Friends and Family: More than three times a week   Attends Religious Services: More than 4 times per year   Active Member of [employer] or Organizations: Yes   Attends Engineer, structural: More than 4 times per year   Marital Status: Married  Catering manager Violence: Not At Risk   Fear of Current or Ex-Partner: No   Emotionally Abused: No   Physically Abused: No    Sexually Abused: No    Outpatient Medications Prior to Visit  Medication Sig Dispense Refill   aspirin EC 81 MG tablet Take 1 tablet (81 mg total) by mouth daily. 90 tablet 3   Ciclopirox 1 % shampoo APP TO SCALP UTD AND PRN  2   Glycerin-Polysorbate 80 (REFRESH DRY EYE THERAPY OP) Apply to eye as  needed.     guaiFENesin-codeine (CHERATUSSIN AC) 100-10 MG/5ML syrup Take 5 mLs by mouth 3 (three) times daily as needed for cough. 60 mL 0   ketoconazole (NIZORAL) 2 % shampoo Apply 1 application topically as needed for irritation. For eczema     Magnesium Malate 1250 (141.7 Mg) MG TABS Take 1 tablet by mouth 3 (three) times a week.     Multiple Vitamins-Minerals (MULTIVITAMIN ADULTS 50+ PO) Take 1 tablet by mouth daily.     Omega-3 Fatty Acids (FISH OIL) 1200 MG CAPS Take 1 capsule by mouth daily.     losartan (COZAAR) 100 MG tablet TAKE ONE TABLET BY MOUTH ONE TIME DAILY 30 tablet 1   metoprolol tartrate (LOPRESSOR) 25 MG tablet TAKE ONE TABLET BY MOUTH TWICE A DAY 60 tablet 1   rosuvastatin (CRESTOR) 5 MG tablet TAKE ONE TABLET BY MOUTH ONE TIME DAILY 90 tablet 3   nitroGLYCERIN (NITROSTAT) 0.4 MG SL tablet Place 1 tablet (0.4 mg total) under the tongue every 5 (five) minutes as needed for chest pain. 25 tablet 11   meclizine (ANTIVERT) 25 MG tablet Take 25 mg by mouth 3 (three) times daily as needed for dizziness.     tadalafil (CIALIS) 20 MG tablet TAKE ONE TABLET BY MOUTH ONE TIME DAILY AS NEEDED 20 tablet 3   No facility-administered medications prior to visit.    Allergies  Allergen Reactions   Augmentin [Amoxicillin-Pot Clavulanate] Diarrhea    *Can take plain Amoxicillin* Has patient had a PCN reaction causing immediate rash, facial/tongue/throat swelling, SOB or lightheadedness with hypotension: No Has patient had a PCN reaction causing severe rash involving mucus membranes or skin necrosis: No Has patient had a PCN reaction that required hospitalization No Has patient had a PCN  reaction occurring within the last 10 years: Yes If all of the above answers are "NO", then may proceed with Cephalosporin use.    Tessalon [Benzonatate]     Deep uncontrollable cough   Penicillins Rash    Childhood reaction *Can take Amoxicillin* Has patient had a PCN reaction causing immediate rash, facial/tongue/throat swelling, SOB or lightheadedness with hypotension: Unknown Has patient had a PCN reaction causing severe rash involving mucus membranes or skin necrosis: Unknown Has patient had a PCN reaction that required hospitalization: Unknown Has patient had a PCN reaction occurring within the last 10 years: Unknown  If all of the above answers are "NO", then may proceed with Cephalosporin use    ROS     Objective:    Physical Exam Constitutional:      General: Gerald [name] is not in acute distress.    Appearance: Gerald [name] is well-developed.  HENT:     Head: Normocephalic and atraumatic.  Cardiovascular:     Rate and Rhythm: Normal rate and regular rhythm.     Heart sounds: No murmur heard. Pulmonary:     Effort: Pulmonary effort is normal. No respiratory distress.     Breath sounds: Normal breath sounds. No wheezing or rales.  Skin:    General: Skin is warm and dry.  Neurological:     Mental Status: Gerald [name] is alert and oriented to person, place, and time.  Psychiatric:        Behavior: Behavior normal.        Thought Content: Thought content normal.    BP 125/66 (BP Location: Right Arm, Patient Position: Sitting, Cuff Size: Small)   Pulse 62   Temp 98.6 F (37 C) (Oral)   Resp 16  Wt 182 lb (82.6 kg)   SpO2 97%   BMI 28.51 kg/m  Wt Readings from Last 3 Encounters:  11/19/20 182 lb (82.6 kg)  10/25/20 178 lb (80.7 kg)  04/24/20 182 lb 1.6 oz (82.6 kg)       Assessment & Plan:   Problem List Items Addressed This Visit       Unprioritized   Hypertension    bp at goal. Continue losartan 100mg  and metoprolol 50mg  bid.       Relevant Medications   rosuvastatin  (CRESTOR) 5 MG tablet   losartan (COZAAR) 100 MG tablet   metoprolol tartrate (LOPRESSOR) 25 MG tablet   Hyperlipidemia - Primary    Tolerating rosuvastatin 5mg . Continue same. Obtain follow up lipid panel.       Relevant Medications   rosuvastatin (CRESTOR) 5 MG tablet   losartan (COZAAR) 100 MG tablet   metoprolol tartrate (LOPRESSOR) 25 MG tablet   Other Relevant Orders   Comp Met (CMET)   Lipid panel    I have discontinued Chase Picket. Minchew "Ben"'s meclizine and tadalafil. I have also changed his rosuvastatin, losartan, and metoprolol tartrate. Additionally, I am having him maintain his ketoconazole, Multiple Vitamins-Minerals (MULTIVITAMIN ADULTS 50+ PO), Magnesium Malate, Ciclopirox, Glycerin-Polysorbate 80 (REFRESH DRY EYE THERAPY OP), Fish Oil, aspirin EC, nitroGLYCERIN, and Cheratussin AC.  Meds ordered this encounter  Medications   rosuvastatin (CRESTOR) 5 MG tablet    Sig: Take 1 tablet (5 mg total) by mouth daily.    Dispense:  90 tablet    Refill:  1    Order Specific Question:   Supervising Provider    Answer:   Danise Edge A [[id number]]   losartan (COZAAR) 100 MG tablet    Sig: Take 1 tablet (100 mg total) by mouth daily.    Dispense:  90 tablet    Refill:  1    Order Specific Question:   Supervising Provider    Answer:   Danise Edge A [[id number]]   metoprolol tartrate (LOPRESSOR) 25 MG tablet    Sig: Take 1 tablet (25 mg total) by mouth 2 (two) times daily.    Dispense:  180 tablet    Refill:  1    Order Specific Question:   Supervising Provider    Answer:   Danise Edge A [[id number]]

## 2020-11-20 ENCOUNTER — Other Ambulatory Visit (INDEPENDENT_AMBULATORY_CARE_PROVIDER_SITE_OTHER): Payer: Medicare Other

## 2020-11-20 DIAGNOSIS — E785 Hyperlipidemia, unspecified: Secondary | ICD-10-CM

## 2020-11-20 LAB — LIPID PANEL
Cholesterol: 140 mg/dL (ref 0–200)
HDL: 36.8 mg/dL — ABNORMAL LOW (ref >39.00)
NonHDL: 103.39
Total CHOL/HDL Ratio: 4
Triglycerides: 231 mg/dL — ABNORMAL HIGH (ref 0.0–149.0)
VLDL: 46.2 mg/dL — ABNORMAL HIGH (ref 0.0–40.0)

## 2020-11-20 LAB — COMPREHENSIVE METABOLIC PANEL
ALT: 24 U/L (ref 0–53)
AST: 20 U/L (ref 0–37)
Albumin: 4 g/dL (ref 3.5–5.2)
Alkaline Phosphatase: 81 U/L (ref 39–117)
BUN: 24 mg/dL — ABNORMAL HIGH (ref 6–23)
CO2: 29 mEq/L (ref 19–32)
Calcium: 9.2 mg/dL (ref 8.4–10.5)
Chloride: 104 mEq/L (ref 96–112)
Creatinine, Ser: 1.01 mg/dL (ref 0.40–1.50)
GFR: 73.43 mL/min (ref >60.00)
Glucose, Bld: 80 mg/dL (ref 70–99)
Potassium: 4.1 mEq/L (ref 3.5–5.1)
Sodium: 140 mEq/L (ref 135–145)
Total Bilirubin: 0.8 mg/dL (ref 0.2–1.2)
Total Protein: 6.4 g/dL (ref 6.0–8.3)

## 2020-11-20 LAB — LDL CHOLESTEROL, DIRECT: Direct LDL: 77 mg/dL

## 2020-11-27 DIAGNOSIS — H5203 Hypermetropia, bilateral: Secondary | ICD-10-CM | POA: Diagnosis not present

## 2020-11-27 DIAGNOSIS — H40051 Ocular hypertension, right eye: Secondary | ICD-10-CM | POA: Diagnosis not present

## 2020-11-27 DIAGNOSIS — H25813 Combined forms of age-related cataract, bilateral: Secondary | ICD-10-CM | POA: Diagnosis not present

## 2020-11-27 DIAGNOSIS — H35033 Hypertensive retinopathy, bilateral: Secondary | ICD-10-CM | POA: Diagnosis not present

## 2020-11-27 DIAGNOSIS — H52223 Regular astigmatism, bilateral: Secondary | ICD-10-CM | POA: Diagnosis not present

## 2020-11-27 DIAGNOSIS — I10 Essential (primary) hypertension: Secondary | ICD-10-CM | POA: Diagnosis not present

## 2020-12-06 DIAGNOSIS — N401 Enlarged prostate with lower urinary tract symptoms: Secondary | ICD-10-CM | POA: Diagnosis not present

## 2020-12-06 DIAGNOSIS — R3912 Poor urinary stream: Secondary | ICD-10-CM | POA: Diagnosis not present

## 2020-12-16 ENCOUNTER — Other Ambulatory Visit: Payer: Self-pay | Admitting: Family

## 2020-12-17 ENCOUNTER — Other Ambulatory Visit: Payer: Self-pay

## 2020-12-17 NOTE — Telephone Encounter (Signed)
Rx request, prescription expired.

## 2020-12-18 ENCOUNTER — Encounter: Payer: Self-pay | Admitting: Family

## 2020-12-18 MED ORDER — TADALAFIL 20 MG PO TABS: 10.0000 mg | ORAL_TABLET | ORAL | 11 refills | Status: DC | PRN

## 2020-12-20 ENCOUNTER — Other Ambulatory Visit (HOSPITAL_BASED_OUTPATIENT_CLINIC_OR_DEPARTMENT_OTHER): Payer: Self-pay

## 2020-12-20 ENCOUNTER — Ambulatory Visit: Payer: Medicare Other | Attending: Internal Medicine

## 2020-12-20 DIAGNOSIS — Z23 Encounter for immunization: Secondary | ICD-10-CM

## 2020-12-20 MED ORDER — INFLUENZA VAC A&B SA ADJ QUAD 0.5 ML IM PRSY
PREFILLED_SYRINGE | INTRAMUSCULAR | 0 refills | Status: DC
  Filled 2020-12-20: qty 0.5, 1d supply, fill #0

## 2020-12-20 NOTE — Progress Notes (Signed)
Covid-19 Vaccination Clinic  Name:  CONWAY MOYNIHAN    MRN: [id number] DOB: 09/08/1945  12/20/2020  Mr. Parmar was observed post Covid-19 immunization for 15 minutes without incident. He was provided with Vaccine Information Sheet and instruction to access the V-Safe system.   Mr. Breakey was instructed to call 911 with any severe reactions post vaccine: Difficulty breathing  Swelling of face and throat  A fast heartbeat  A bad rash all over body  Dizziness and weakness

## 2020-12-26 ENCOUNTER — Other Ambulatory Visit (HOSPITAL_BASED_OUTPATIENT_CLINIC_OR_DEPARTMENT_OTHER): Payer: Self-pay

## 2020-12-26 DIAGNOSIS — Z23 Encounter for immunization: Secondary | ICD-10-CM | POA: Diagnosis not present

## 2020-12-26 MED ORDER — COVID-19MRNA BIVAL VACC PFIZER 30 MCG/0.3ML IM SUSP
INTRAMUSCULAR | 0 refills | Status: DC
  Filled 2020-12-26: qty 0.3, 1d supply, fill #0

## 2021-01-01 ENCOUNTER — Encounter: Payer: Self-pay | Admitting: Gastroenterology

## 2021-01-01 ENCOUNTER — Encounter: Payer: Self-pay | Admitting: Family

## 2021-01-30 ENCOUNTER — Ambulatory Visit (INDEPENDENT_AMBULATORY_CARE_PROVIDER_SITE_OTHER): Payer: Medicare Other | Admitting: Gastroenterology

## 2021-01-30 ENCOUNTER — Encounter: Payer: Self-pay | Admitting: Gastroenterology

## 2021-01-30 VITALS — BP 126/70 | HR 64 | Ht 67.0 in | Wt 183.0 lb

## 2021-01-30 DIAGNOSIS — K2100 Gastro-esophageal reflux disease with esophagitis, without bleeding: Secondary | ICD-10-CM

## 2021-01-30 NOTE — Patient Instructions (Addendum)
If you are age 74 or older, your body mass index should be between 23-30. Your Body mass index is 28.66 kg/m. If this is out of the aforementioned range listed, please consider follow up with your Primary Care Provider. ________________________________________________________  The [facility] providers would like to encourage you to use [facility] to communicate with providers for non-urgent requests or questions.  Due to long hold times on the telephone, sending your provider a message by [facility] may be a faster and more efficient way to get a response.  Please allow 48 business hours for a response.  Please remember that this is for non-urgent requests.  _______________________________________________________  Please start taking citrucel (orange flavored) powder fiber supplement.  This may cause some bloating at first but that usually goes away. Begin with a small spoonful and work your way up to a large, heaping spoonful daily over a week.  Please purchase the following medications over the counter and take as directed:  START: Omeprazole 20mg  one capsule shortly before breakfast meal each day.  You are scheduled to follow up in our office on 04-16-21 at 950am.  Thank you for entrusting me with your care and choosing [facility].  Dr Ardis Hughs

## 2021-01-30 NOTE — Progress Notes (Signed)
Review of pertinent gastrointestinal problems: 1.  Routine risk for colon cancer.  Colonoscopy December 2018 Dr. Ardis Hughs found no polyps.  He did have diverticulosis of the left colon and hemorrhoids.  Insignificant family history of colon cancer with father having colon cancer in his 89s. 2.  Dyspepsia led to EGD November 2015.  Moderate nonspecific pangastritis.  Likely reflux related esophagitis.  Biopsies showed no sign of H. pylori.  He was recommended to take once daily omeprazole 20 mg for his acid related esophagitis.   HPI: Gerald is a very pleasant 75 year old man who was referred to me by Debbrah Alar, NP  to evaluate change in bowels, intermittent GERD symptoms.    I last saw him about 4 years ago the time of a colonoscopy.  See that results summarized above.  He has alternating bowel habits with some days of normal bowels, some days of rockhard stools, about once a month he will have a blowout with loose stools.  He never sees blood in his stool.  He also belches quite a lot.  He feels stuff will reflux into his esophagus and he will have a bit of burning.  He takes Tums for Gerald and it does seem to help.  He intentionally lost 35 pounds last year and then has gained 10 pounds back since then.  Gerald was with a lot of dieting.  He has never tried fiber supplements, he takes Tums for his GERD symptoms.   Old Data Reviewed:   Blood work August 2355 metabolic profile was normal   Review of systems: Pertinent positive and negative review of systems were noted in the above HPI section. All other review negative.   Past Medical History:  Diagnosis Date   APC (atrial premature contractions) 10/18/2018   Back pain, chronic    Benign localized prostatic hyperplasia with lower urinary tract symptoms (LUTS)    Bladder outlet obstruction    Bleeding from urethra in male 10/26/2017   3 times over 7 hours   Chest pain in adult 10/18/2018   Depression    Diverticulosis of colon     Elevated PSA    Fatty liver    GERD (gastroesophageal reflux disease)    Hiatal hernia    History of alcohol abuse    quit 1989   History of esophageal stricture    2003  S/P  DILATATION   History of esophagitis    History of melanoma in situ    06/ 2017  REMOVAL RIGHT CHEEK AREA   History of sepsis 06/06/2016   admitted for urosepsis , UTI due to pseudomonas-- resolved    HTN (hypertension) 09/01/2011   Hx of adenomatous colonic polyps    2003   TUBULAR ADENOMA   Hyperlipidemia    Hypertension    Hypertensive left ventricular hypertrophy, without heart failure 06/05/2015   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   Mild sleep apnea    2004 mild, per sleep study-- no cpap recommended  (pt denies)    Past Surgical History:  Procedure Laterality Date   CARDIOVASCULAR STRESS TEST  04-17-2015  dr Claiborne Billings   Normal stress nulcear study w/ a small, mild, fixed inferior defect consistent with inferior thinning; no ischemia; normal LV function and wall motion , stress ef 56%   COLONOSCOPY  last one 12-02-2006   CYSTOSCOPY WITH URETHRAL DILATATION N/A 10/27/2017   Procedure: CYSTOSCOPY WITH URETHRAL DILATATION;  Surgeon: Ceasar Mons, MD;  Location: [facility];  Service: Urology;  Laterality:  N/A;  ONLY NEEDS 90 MIN FOR ALL PROCEDURES   EXCISION LEFT ARM MASS  1988   MELANOMA EXCISION     MOHS SURGERY  09/06/2015   right parotidemomasseteric excision w/ complex repair for melanoma in situ   THULIUM LASER TURP (TRANSURETHRAL RESECTION OF PROSTATE) N/A 07/29/2016   Procedure: Marcelino Duster LASER TURP (TRANSURETHRAL RESECTION OF PROSTATE)/ TRANSRECTAL ULTRASOUND GUIDED PROSTATE BIOPSY;  Surgeon: Nickie Retort, MD;  Location: [place];  Service: Urology;  Laterality: N/A;   TONSILLECTOMY AND ADENOIDECTOMY  child   TRANSTHORACIC ECHOCARDIOGRAM  04/16/2015   moderate LVH,  ef 27-06%, grade 1 diastolic dysfunction/  trivial AR, MR and PR/  mild dilated ascending  aorta/  mild TR   TRANSURETHRAL RESECTION OF PROSTATE N/A 10/27/2017   Procedure: TRANSURETHRAL RESECTION OF THE PROSTATE (TURP);  Surgeon: Ceasar Mons, MD;  Location: [facility];  Service: Urology;  Laterality: N/A;   UPPER GASTROINTESTINAL ENDOSCOPY  last one 02-15-2014    Current Outpatient Medications  Medication Sig Dispense Refill   aspirin EC 81 MG tablet Take 1 tablet (81 mg total) by mouth daily. 90 tablet 3   Ciclopirox 1 % shampoo APP TO SCALP UTD AND PRN  2   COVID-19 mRNA bivalent vaccine, Pfizer, injection Inject into the muscle. 0.3 mL 0   Glycerin-Polysorbate 80 (REFRESH DRY EYE THERAPY OP) Apply to eye as needed.     influenza vaccine adjuvanted (FLUAD) 0.5 ML injection Inject into the muscle. 0.5 mL 0   ketoconazole (NIZORAL) 2 % shampoo Apply 1 application topically as needed for irritation. For eczema     losartan (COZAAR) 100 MG tablet Take 1 tablet (100 mg total) by mouth daily. 90 tablet 1   metoprolol tartrate (LOPRESSOR) 25 MG tablet Take 1 tablet (25 mg total) by mouth 2 (two) times daily. 180 tablet 1   Multiple Vitamins-Minerals (MULTIVITAMIN ADULTS 50+ PO) Take 1 tablet by mouth daily.     Omega-3 Fatty Acids (FISH OIL) 1200 MG CAPS Take 1 capsule by mouth daily.     rosuvastatin (CRESTOR) 5 MG tablet Take 1 tablet (5 mg total) by mouth daily. 90 tablet 1   tadalafil (CIALIS) 20 MG tablet Take 0.5-1 tablets (10-20 mg total) by mouth every other day as needed for erectile dysfunction. 10 tablet 11   nitroGLYCERIN (NITROSTAT) 0.4 MG SL tablet Place 1 tablet (0.4 mg total) under the tongue every 5 (five) minutes as needed for chest pain. 25 tablet 11   No current facility-administered medications for Gerald visit.    Allergies as of 01/30/2021 - Review Complete 01/30/2021  Allergen Reaction Noted   Augmentin [amoxicillin-pot clavulanate] Diarrhea 06/06/2016   Tessalon [benzonatate]  09/16/2019   Penicillins Rash     Family History   Problem Relation Age of Onset   Melanoma Mother    Uterine cancer Mother    Coronary artery disease Father    Colon cancer Father        ? unknown age dx/died at 39   Coronary artery disease Maternal Grandmother    Colon cancer Maternal Grandmother 74   Cancer Maternal Grandmother        colon   Heart attack Maternal Grandfather    Coronary artery disease Paternal Grandmother    Heart attack Paternal Grandfather    Hypertension Maternal Aunt    Prostate cancer Neg Hx    Stroke Neg Hx    Esophageal cancer Neg Hx    Pancreatic cancer Neg Hx  Rectal cancer Neg Hx    Stomach cancer Neg Hx     Social History   Socioeconomic History   Marital status: Married    Spouse name: Not on file   Number of children: 2   Years of education: Not on file   Highest education level: Not on file  Occupational History   Occupation: Retired  Tobacco Use   Smoking status: Former    Packs/day: 2.00    Years: 30.00    Pack years: 60.00    Types: Cigarettes    Quit date: 08/29/2006    Years since quitting: 14.4   Smokeless tobacco: Never  Vaping Use   Vaping Use: Never used  Substance and Sexual Activity   Alcohol use: No   Drug use: No   Sexual activity: Yes  Other Topics Concern   Not on file  Social History Narrative   Diet: regular, lots of vegetables-----exercise : none but active   Caffeine use:  5 cups coffee daily         Social Determinants of Health   Financial Resource Strain: Low Risk    Difficulty of Paying Living Expenses: Not hard at all  Food Insecurity: No Food Insecurity   Worried About Charity fundraiser in the Last Year: Never true   [place] in the Last Year: Never true  Transportation Needs: No Transportation Needs   Lack of Transportation (Medical): No   Lack of Transportation (Non-Medical): No  Physical Activity: Insufficiently Active   Days of Exercise per Week: 5 days   Minutes of Exercise per Session: 10 min  Stress: No Stress Concern  Present   Feeling of Stress : Not at all  Social Connections: Socially Integrated   Frequency of Communication with Friends and Family: More than three times a week   Frequency of Social Gatherings with Friends and Family: More than three times a week   Attends Religious Services: More than 4 times per year   Active Member of [employer] or Organizations: Yes   Attends Music therapist: More than 4 times per year   Marital Status: Married  Human resources officer Violence: Not At Risk   Fear of Current or Ex-Partner: No   Emotionally Abused: No   Physically Abused: No   Sexually Abused: No     Physical Exam: BP 126/70   Pulse 64   Ht 5\' 7"  (1.702 m)   Wt 183 lb (83 kg)   SpO2 96%   BMI 28.66 kg/m  Constitutional: generally well-appearing Psychiatric: alert and oriented x3 Eyes: extraocular movements intact Mouth: oral pharynx moist, no lesions Neck: supple no lymphadenopathy Cardiovascular: heart regular rate and rhythm Lungs: clear to auscultation bilaterally Abdomen: soft, nontender, nondistended, no obvious ascites, no peritoneal signs, normal bowel sounds Extremities: no lower extremity edema bilaterally Skin: no lesions on visible extremities   Assessment and plan: 75 y.o. male with alternating bowel habits, mild GERD  He had similar issues with his bowels many years ago, documented during office visit here in our office.  He had a colonoscopy just less than 4 years ago I do not think that needs to be repeated now.  Instead he is going to try over-the-counter fiber supplements to see if that will help even out his alternating bowel habits.  He has mild GERD, I did note some esophagitis on EGD many years ago.  He does not take H2 blocker proton pump inhibitor.  Rather Tums seems to help him  clinically for the most part however he does have some breakthrough and is bothered by that.  I recommended a trial of over-the-counter strength proton pump inhibitor omeprazole 20 mg  1 pill once daily shortly for his breakfast meal.  He will return to see me in 2 to 3 months and sooner if needed to discuss how he has fared with the above recommendations  Please see the "Patient Instructions" section for addition details about the plan.   Owens Loffler, MD [facility] 01/30/2021, 10:30 AM  Cc: Debbrah Alar, NP  Total time on date of encounter was 45 minutes (Gerald included time spent preparing to see the patient reviewing records; obtaining and/or reviewing separately obtained history; performing a medically appropriate exam and/or evaluation; counseling and educating the patient and family if present; ordering medications, tests or procedures if applicable; and documenting clinical information in the health record).

## 2021-02-20 DIAGNOSIS — M79672 Pain in left foot: Secondary | ICD-10-CM | POA: Diagnosis not present

## 2021-02-20 DIAGNOSIS — B351 Tinea unguium: Secondary | ICD-10-CM | POA: Diagnosis not present

## 2021-02-20 DIAGNOSIS — M79671 Pain in right foot: Secondary | ICD-10-CM | POA: Diagnosis not present

## 2021-02-20 DIAGNOSIS — Q6689 Other  specified congenital deformities of feet: Secondary | ICD-10-CM | POA: Diagnosis not present

## 2021-03-31 ENCOUNTER — Other Ambulatory Visit: Payer: Self-pay | Admitting: Family

## 2021-04-16 ENCOUNTER — Encounter: Payer: Self-pay | Admitting: Gastroenterology

## 2021-04-16 ENCOUNTER — Ambulatory Visit (INDEPENDENT_AMBULATORY_CARE_PROVIDER_SITE_OTHER): Payer: Medicare Other | Admitting: Gastroenterology

## 2021-04-16 VITALS — BP 130/70 | HR 64 | Ht 67.0 in | Wt 187.8 lb

## 2021-04-16 DIAGNOSIS — K2100 Gastro-esophageal reflux disease with esophagitis, without bleeding: Secondary | ICD-10-CM

## 2021-04-16 MED ORDER — FAMOTIDINE 20 MG PO TABS: ORAL_TABLET | ORAL | Status: DC

## 2021-04-16 NOTE — Progress Notes (Signed)
Review of pertinent gastrointestinal problems: 1.  Routine risk for colon cancer.  Colonoscopy December 2018 Dr. Ardis Hughs found no polyps.  He did have diverticulosis of the left colon and hemorrhoids.  Insignificant family history of colon cancer with father having colon cancer in his 34s. 2.  Dyspepsia led to EGD November 2015.  Moderate nonspecific pangastritis.  Likely reflux related esophagitis.  Biopsies showed no sign of H. pylori.  He was recommended to take once daily omeprazole 20 mg for his acid related esophagitis.  2022 he stopped the proton pump inhibitor and was taking Tums periodically.  When he resumed the proton pump inhibitor his symptoms improved again. 3.  Alternating bowel habits, discussed 01/2021 in the office.  No alarm symptoms.  Trial of fiber supplements was very helpful.   HPI: This is a very pleasant 76 year old man   I last saw him here in the office about 2 and half months ago.  He was bothered by alternating bowel habits as well as some mild GERD type symptoms.  I recommended a trial of over-the-counter fiber supplements and also omeprazole over-the-counter 20 mg pill once daily shortly before his breakfast meal.  His weight is up 4 pounds since his last office visit here 2 and half months ago, same scale.  He indeed started his fiber supplement Citrucel on a daily basis and is very happy with his results.  He rarely has to struggle to move his bowels.  He is quite content.  He has seen no bleeding.  He also started omeprazole shortly for breakfast and this has worked about 90% better.  Once or twice a week however he does have some breakthrough indigestion, belching symptoms.  He has no dysphagia.   ROS: complete GI ROS as described in HPI, all other review negative.  Constitutional:  No unintentional weight loss   Past Medical History:  Diagnosis Date   APC (atrial premature contractions) 10/18/2018   Back pain, chronic    Benign localized prostatic  hyperplasia with lower urinary tract symptoms (LUTS)    Bladder outlet obstruction    Bleeding from urethra in male 10/26/2017   3 times over 7 hours   Chest pain in adult 10/18/2018   Depression    Diverticulosis of colon    Elevated PSA    Fatty liver    GERD (gastroesophageal reflux disease)    Hiatal hernia    History of alcohol abuse    quit 1989   History of esophageal stricture    2003  S/P  DILATATION   History of esophagitis    History of melanoma in situ    06/ 2017  REMOVAL RIGHT CHEEK AREA   History of sepsis 06/06/2016   admitted for urosepsis , UTI due to pseudomonas-- resolved    HTN (hypertension) 09/01/2011   Hx of adenomatous colonic polyps    2003   TUBULAR ADENOMA   Hyperlipidemia    Hypertension    Hypertensive left ventricular hypertrophy, without heart failure 06/05/2015   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   Mild sleep apnea    2004 mild, per sleep study-- no cpap recommended  (pt denies)    Past Surgical History:  Procedure Laterality Date   CARDIOVASCULAR STRESS TEST  04-17-2015  dr Claiborne Billings   Normal stress nulcear study w/ a small, mild, fixed inferior defect consistent with inferior thinning; no ischemia; normal LV function and wall motion , stress ef 56%   COLONOSCOPY  last one 12-02-2006   CYSTOSCOPY WITH  URETHRAL DILATATION N/A 10/27/2017   Procedure: CYSTOSCOPY WITH URETHRAL DILATATION;  Surgeon: Ceasar Mons, MD;  Location: [facility];  Service: Urology;  Laterality: N/A;  ONLY NEEDS 90 MIN FOR ALL PROCEDURES   EXCISION LEFT ARM MASS  1988   MELANOMA EXCISION     MOHS SURGERY  09/06/2015   right parotidemomasseteric excision w/ complex repair for melanoma in situ   THULIUM LASER TURP (TRANSURETHRAL RESECTION OF PROSTATE) N/A 07/29/2016   Procedure: Marcelino Duster LASER TURP (TRANSURETHRAL RESECTION OF PROSTATE)/ TRANSRECTAL ULTRASOUND GUIDED PROSTATE BIOPSY;  Surgeon: Nickie Retort, MD;  Location: [facility];   Service: Urology;  Laterality: N/A;   TONSILLECTOMY AND ADENOIDECTOMY  child   TRANSTHORACIC ECHOCARDIOGRAM  04/16/2015   moderate LVH,  ef 80-99%, grade 1 diastolic dysfunction/  trivial AR, MR and PR/  mild dilated ascending aorta/  mild TR   TRANSURETHRAL RESECTION OF PROSTATE N/A 10/27/2017   Procedure: TRANSURETHRAL RESECTION OF THE PROSTATE (TURP);  Surgeon: Ceasar Mons, MD;  Location: [facility];  Service: Urology;  Laterality: N/A;   UPPER GASTROINTESTINAL ENDOSCOPY  last one 02-15-2014    Current Outpatient Medications  Medication Sig Dispense Refill   aspirin EC 81 MG tablet Take 1 tablet (81 mg total) by mouth daily. 90 tablet 3   Ciclopirox 1 % shampoo APP TO SCALP UTD AND PRN  2   Glycerin-Polysorbate 80 (REFRESH DRY EYE THERAPY OP) Apply to eye as needed.     ketoconazole (NIZORAL) 2 % shampoo Apply 1 application topically as needed for irritation. For eczema     losartan (COZAAR) 100 MG tablet TAKE ONE TABLET BY MOUTH ONE TIME DAILY 90 tablet 1   metoprolol tartrate (LOPRESSOR) 25 MG tablet TAKE ONE TABLET BY MOUTH TWICE A DAY 180 tablet 1   Multiple Vitamins-Minerals (MULTIVITAMIN ADULTS 50+ PO) Take 1 tablet by mouth daily.     nitroGLYCERIN (NITROSTAT) 0.4 MG SL tablet Place 1 tablet (0.4 mg total) under the tongue every 5 (five) minutes as needed for chest pain. 25 tablet 11   Omega-3 Fatty Acids (FISH OIL) 1200 MG CAPS Take 1 capsule by mouth daily.     rosuvastatin (CRESTOR) 5 MG tablet Take 1 tablet (5 mg total) by mouth daily. 90 tablet 1   tadalafil (CIALIS) 20 MG tablet Take 0.5-1 tablets (10-20 mg total) by mouth every other day as needed for erectile dysfunction. 10 tablet 11   No current facility-administered medications for this visit.    Allergies as of 04/16/2021 - Review Complete 04/16/2021  Allergen Reaction Noted   Augmentin [amoxicillin-pot clavulanate] Diarrhea 06/06/2016   Tessalon [benzonatate]  09/16/2019   Penicillins  Rash     Family History  Problem Relation Age of Onset   Melanoma Mother    Uterine cancer Mother    Coronary artery disease Father    Colon cancer Father        ? unknown age dx/died at 60   Coronary artery disease Maternal Grandmother    Colon cancer Maternal Grandmother 74   Heart attack Maternal Grandfather    Coronary artery disease Paternal Grandmother    Heart attack Paternal Grandfather    Hypertension Maternal Aunt    Prostate cancer Neg Hx    Stroke Neg Hx    Esophageal cancer Neg Hx    Pancreatic cancer Neg Hx    Rectal cancer Neg Hx    Stomach cancer Neg Hx     Social History  Socioeconomic History   Marital status: Married    Spouse name: Not on file   Number of children: 2   Years of education: Not on file   Highest education level: Not on file  Occupational History   Occupation: Retired  Tobacco Use   Smoking status: Former    Packs/day: 2.00    Years: 30.00    Pack years: 60.00    Types: Cigarettes    Quit date: 08/29/2006    Years since quitting: 14.6   Smokeless tobacco: Never  Vaping Use   Vaping Use: Never used  Substance and Sexual Activity   Alcohol use: No   Drug use: No   Sexual activity: Yes  Other Topics Concern   Not on file  Social History Narrative   Diet: regular, lots of vegetables-----exercise : none but active   Caffeine use:  5 cups coffee daily         Social Determinants of Health   Financial Resource Strain: Low Risk    Difficulty of Paying Living Expenses: Not hard at all  Food Insecurity: No Food Insecurity   Worried About Charity fundraiser in the Last Year: Never true   [place] in the Last Year: Never true  Transportation Needs: No Transportation Needs   Lack of Transportation (Medical): No   Lack of Transportation (Non-Medical): No  Physical Activity: Insufficiently Active   Days of Exercise per Week: 5 days   Minutes of Exercise per Session: 10 min  Stress: No Stress Concern Present   Feeling of  Stress : Not at all  Social Connections: Socially Integrated   Frequency of Communication with Friends and Family: More than three times a week   Frequency of Social Gatherings with Friends and Family: More than three times a week   Attends Religious Services: More than 4 times per year   Active Member of [employer] or Organizations: Yes   Attends Music therapist: More than 4 times per year   Marital Status: Married  Human resources officer Violence: Not At Risk   Fear of Current or Ex-Partner: No   Emotionally Abused: No   Physically Abused: No   Sexually Abused: No     Physical Exam: Ht 5\' 7"  (1.702 m)    Wt 187 lb 12.8 oz (85.2 kg)    BMI 29.41 kg/m  Constitutional: generally well-appearing Psychiatric: alert and oriented x3 Abdomen: soft, nontender, nondistended, no obvious ascites, no peritoneal signs, normal bowel sounds No peripheral edema noted in lower extremities  Assessment and plan: 76 y.o. male with chronic mild GERD without alarm symptoms.  Alternating bowel habits  He is going to continue fiber supplements on a daily basis and knows he can continue this indefinitely.  This is significantly helped his alternating bowel patterns.  He is also going to continue taking over-the-counter strength omeprazole shortly before his breakfast meal.  I recommended he try taking H2 blocker Pepcid/famotidine 20 mg pill on an as-needed basis for breakthrough in the afternoon.  He knows to call if he has any questions or concerns or new gi issues in the future.  Please see the "Patient Instructions" section for addition details about the plan.  Owens Loffler, MD [facility] 04/16/2021, 9:53 AM   Total time on date of encounter was 25 minutes (this included time spent preparing to see the patient reviewing records; obtaining and/or reviewing separately obtained history; performing a medically appropriate exam and/or evaluation; counseling and educating the patient and  family if present; ordering medications, tests or procedures if applicable; and documenting clinical information in the health record).

## 2021-04-16 NOTE — Patient Instructions (Addendum)
If you are age 75 or older, your body mass index should be between 23-30. Your Body mass index is 29.41 kg/m. If this is out of the aforementioned range listed, please consider follow up with your Primary Care Provider. ________________________________________________________  The [facility] providers would like to encourage you to use [facility] to communicate with providers for non-urgent requests or questions.  Due to long hold times on the telephone, sending your provider a message by [facility] may be a faster and more efficient way to get a response.  Please allow 48 business hours for a response.  Please remember that this is for non-urgent requests.  _______________________________________________________  CONTINUE: Fiber  CONTINUE: Omeprazole before breakfast.  Please purchase the following medications over the counter and take as directed:  START: famotidine 20mg   take one tablet for breakthrough GERD.  Thank you for entrusting me with your care and choosing [facility].  Dr Ardis Hughs

## 2021-05-15 ENCOUNTER — Encounter: Payer: Self-pay | Admitting: Family

## 2021-05-17 ENCOUNTER — Encounter: Payer: Self-pay | Admitting: Family

## 2021-05-22 DIAGNOSIS — M79672 Pain in left foot: Secondary | ICD-10-CM | POA: Diagnosis not present

## 2021-05-22 DIAGNOSIS — B351 Tinea unguium: Secondary | ICD-10-CM | POA: Diagnosis not present

## 2021-05-22 DIAGNOSIS — M79671 Pain in right foot: Secondary | ICD-10-CM | POA: Diagnosis not present

## 2021-06-28 ENCOUNTER — Other Ambulatory Visit: Payer: Self-pay | Admitting: Family

## 2021-07-16 ENCOUNTER — Encounter: Payer: Self-pay | Admitting: Family

## 2021-07-24 ENCOUNTER — Encounter: Payer: Self-pay | Admitting: Family

## 2021-07-31 DIAGNOSIS — M79672 Pain in left foot: Secondary | ICD-10-CM | POA: Diagnosis not present

## 2021-07-31 DIAGNOSIS — B351 Tinea unguium: Secondary | ICD-10-CM | POA: Diagnosis not present

## 2021-07-31 DIAGNOSIS — M79671 Pain in right foot: Secondary | ICD-10-CM | POA: Diagnosis not present

## 2021-08-01 ENCOUNTER — Ambulatory Visit: Payer: Medicare Other | Admitting: Cardiology

## 2021-08-05 DIAGNOSIS — Z20822 Contact with and (suspected) exposure to covid-19: Secondary | ICD-10-CM | POA: Diagnosis not present

## 2021-09-10 ENCOUNTER — Ambulatory Visit (INDEPENDENT_AMBULATORY_CARE_PROVIDER_SITE_OTHER): Payer: Medicare Other | Admitting: Cardiology

## 2021-09-10 ENCOUNTER — Encounter: Payer: Self-pay | Admitting: Cardiology

## 2021-09-10 VITALS — BP 128/68 | HR 60 | Ht 67.0 in | Wt 187.0 lb

## 2021-09-10 DIAGNOSIS — I10 Essential (primary) hypertension: Secondary | ICD-10-CM | POA: Diagnosis not present

## 2021-09-10 DIAGNOSIS — E782 Mixed hyperlipidemia: Secondary | ICD-10-CM | POA: Diagnosis not present

## 2021-09-10 DIAGNOSIS — I25118 Atherosclerotic heart disease of native coronary artery with other forms of angina pectoris: Secondary | ICD-10-CM | POA: Diagnosis not present

## 2021-09-10 NOTE — Patient Instructions (Signed)

## 2021-09-10 NOTE — Progress Notes (Signed)
Cardiology Office Note:    Date:  09/10/2021   ID:  Gerald [name], DOB 12/11/1945, MRN [id number]  PCP:  Sandford Craze, NP  Cardiologist:  Norman Herrlich, MD    Referring MD: Sandford Craze, NP    ASSESSMENT:    1. Coronary artery disease of native artery of native heart with stable angina pectoris (HCC)   2. Mixed hyperlipidemia   3. Primary hypertension    PLAN:    In order of problems listed above:  Gerald [name] continues to do well with CAD he has had no anginal discomfort in the last year and a half New York Heart Association class I continue medical treatment including aspirin low-dose beta-blocker and a statin has nitroglycerin if needed.  I do not think he requires repeat cardiac imaging at this time Continue his high intensity statin his last lipid profile at target has upcoming wellness exam with his PCP BP at target continue current treatment including ARB   Next appointment: 1 year   Medication Adjustments/Labs and Tests Ordered: Current medicines are reviewed at length with the patient today.  Concerns regarding medicines are outlined above.  No orders of the defined types were placed in this encounter.  No orders of the defined types were placed in this encounter.   Chief Complaint  Patient presents with   Follow-up   Coronary Artery Disease    History of Present Illness:    Gerald [name] is a 76 y.o. male with a hx of CAD hypertension hyperlipidemia last seen 04/24/2020.  Is coronary calcium score was elevated 119/63rd percentile he had mild mid LAD stenosis 25 to 49% and mild to moderate right coronary artery 50 to 69% on cardiac CTA.  Compliance with diet, lifestyle and medications: Yes  He has done well in the interim has had no chest discomfort. He is a vigorous active man and cares for his son and adult with cerebral palsy He tolerates his statin without muscle pain or weakness No edema shortness of breath chest pain palpitation or  syncope He has an upcoming wellness exam including lipid profile with his PCP.  His last LDL was at target less than 70. Past Medical History:  Diagnosis Date   APC (atrial premature contractions) 10/18/2018   Back pain, chronic    Benign localized prostatic hyperplasia with lower urinary tract symptoms (LUTS)    Bladder outlet obstruction    Bleeding from urethra in male 10/26/2017   3 times over 7 hours   Chest pain in adult 10/18/2018   Depression    Diverticulosis of colon    Elevated PSA    Fatty liver    GERD (gastroesophageal reflux disease)    Hiatal hernia    History of alcohol abuse    quit 1989   History of esophageal stricture    2003  S/P  DILATATION   History of esophagitis    History of melanoma in situ    06/ 2017  REMOVAL RIGHT CHEEK AREA   History of sepsis 06/06/2016   admitted for urosepsis , UTI due to pseudomonas-- resolved    HTN (hypertension) 09/01/2011   Hx of adenomatous colonic polyps    2003   TUBULAR ADENOMA   Hyperlipidemia    Hypertension    Hypertensive left ventricular hypertrophy, without heart failure 06/05/2015   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   Mild sleep apnea    2004 mild, per sleep study-- no cpap recommended  (pt denies)    Past Surgical History:  Procedure Laterality Date   CARDIOVASCULAR STRESS TEST  04-17-2015  dr Tresa Endo   Normal stress nulcear study w/ a small, mild, fixed inferior defect consistent with inferior thinning; no ischemia; normal LV function and wall motion , stress ef 56%   COLONOSCOPY  last one 12-02-2006   CYSTOSCOPY WITH URETHRAL DILATATION N/A 10/27/2017   Procedure: CYSTOSCOPY WITH URETHRAL DILATATION;  Surgeon: Rene Paci, MD;  Location: [facility];  Service: Urology;  Laterality: N/A;  ONLY NEEDS 90 MIN FOR ALL PROCEDURES   EXCISION LEFT ARM MASS  1988   MELANOMA EXCISION     MOHS SURGERY  09/06/2015   right parotidemomasseteric excision w/ complex repair for melanoma in situ    THULIUM LASER TURP (TRANSURETHRAL RESECTION OF PROSTATE) N/A 07/29/2016   Procedure: Morton Peters LASER TURP (TRANSURETHRAL RESECTION OF PROSTATE)/ TRANSRECTAL ULTRASOUND GUIDED PROSTATE BIOPSY;  Surgeon: Hildred Laser, MD;  Location: [facility];  Service: Urology;  Laterality: N/A;   TONSILLECTOMY AND ADENOIDECTOMY  child   TRANSTHORACIC ECHOCARDIOGRAM  04/16/2015   moderate LVH,  ef 60-65%, grade 1 diastolic dysfunction/  trivial AR, MR and PR/  mild dilated ascending aorta/  mild TR   TRANSURETHRAL RESECTION OF PROSTATE N/A 10/27/2017   Procedure: TRANSURETHRAL RESECTION OF THE PROSTATE (TURP);  Surgeon: Rene Paci, MD;  Location: [facility];  Service: Urology;  Laterality: N/A;   UPPER GASTROINTESTINAL ENDOSCOPY  last one 02-15-2014    Current Medications: Current Meds  Medication Sig   aspirin EC 81 MG tablet Take 1 tablet (81 mg total) by mouth daily.   Ciclopirox 1 % shampoo APP TO SCALP UTD AND PRN   famotidine (PEPCID) 20 MG tablet Take one tablet daily as needed for breakthrough GERD.   Glycerin-Polysorbate 80 (REFRESH DRY EYE THERAPY OP) Apply to eye as needed.   ketoconazole (NIZORAL) 2 % shampoo Apply 1 application topically as needed for irritation. For eczema   losartan (COZAAR) 100 MG tablet TAKE ONE TABLET BY MOUTH ONE TIME DAILY   Methylcellulose, Laxative, (CITRUCEL PO) Take by mouth as directed.   metoprolol tartrate (LOPRESSOR) 25 MG tablet TAKE ONE TABLET BY MOUTH TWICE A DAY   Multiple Vitamins-Minerals (MULTIVITAMIN ADULTS 50+ PO) Take 1 tablet by mouth daily.   nitroGLYCERIN (NITROSTAT) 0.4 MG SL tablet Place 1 tablet (0.4 mg total) under the tongue every 5 (five) minutes as needed for chest pain.   Omega-3 Fatty Acids (FISH OIL) 1200 MG CAPS Take 1 capsule by mouth daily.   omeprazole (PRILOSEC OTC) 20 MG tablet Take 20 mg by mouth in the morning.   rosuvastatin (CRESTOR) 5 MG tablet Take 1 tablet (5 mg total) by mouth  daily.   tadalafil (CIALIS) 20 MG tablet Take 0.5-1 tablets (10-20 mg total) by mouth every other day as needed for erectile dysfunction.     Allergies:   Augmentin [amoxicillin-pot clavulanate], Tessalon [benzonatate], and Penicillins   Social History   Socioeconomic History   Marital status: Married    Spouse name: Not on file   Number of children: 2   Years of education: Not on file   Highest education level: Not on file  Occupational History   Occupation: Retired  Tobacco Use   Smoking status: Former    Packs/day: 2.00    Years: 30.00    Total pack years: 60.00    Types: Cigarettes    Quit date: 08/29/2006    Years since quitting: 15.0    Passive exposure:  Past   Smokeless tobacco: Never  Vaping Use   Vaping Use: Never used  Substance and Sexual Activity   Alcohol use: No   Drug use: No   Sexual activity: Yes  Other Topics Concern   Not on file  Social History Narrative   Diet: regular, lots of vegetables-----exercise : none but active   Caffeine use:  5 cups coffee daily         Social Determinants of Health   Financial Resource Strain: Low Risk  (10/25/2020)   Overall Financial Resource Strain (CARDIA)    Difficulty of Paying Living Expenses: Not hard at all  Food Insecurity: No Food Insecurity (10/25/2020)   Hunger Vital Sign    Worried About Running Out of Food in the Last Year: Never true    Ran Out of Food in the Last Year: Never true  Transportation Needs: No Transportation Needs (10/25/2020)   PRAPARE - Administrator, Civil Service (Medical): No    Lack of Transportation (Non-Medical): No  Physical Activity: Insufficiently Active (10/25/2020)   Exercise Vital Sign    Days of Exercise per Week: 5 days    Minutes of Exercise per Session: 10 min  Stress: No Stress Concern Present (10/25/2020)   [employer] of Occupational Health - Occupational Stress Questionnaire    Feeling of Stress : Not at all  Social Connections: Socially  Integrated (10/25/2020)   Social Connection and Isolation Panel [NHANES]    Frequency of Communication with Friends and Family: More than three times a week    Frequency of Social Gatherings with Friends and Family: More than three times a week    Attends Religious Services: More than 4 times per year    Active Member of [employer] or Organizations: Yes    Attends Engineer, structural: More than 4 times per year    Marital Status: Married     Family History: The patient's family history includes Colon cancer in his father; Colon cancer (age of onset: 11) in his maternal grandmother; Coronary artery disease in his father, maternal grandmother, and paternal grandmother; Heart attack in his maternal grandfather and paternal grandfather; Hypertension in his maternal aunt; Melanoma in his mother; Uterine cancer in his mother. There is no history of Prostate cancer, Stroke, Esophageal cancer, Pancreatic cancer, Rectal cancer, or Stomach cancer. ROS:   Please see the history of present illness.    All other systems reviewed and are negative.  EKGs/Labs/Other Studies Reviewed:    The following studies were reviewed today:  EKG:  EKG ordered today and personally reviewed.  The ekg ordered today demonstrates sinus rhythm  Left axis deviation left anterior hemiblock poor R wave progression Recent Labs: 11/20/2020: ALT 24; BUN 24; Creatinine, Ser 1.01; Potassium 4.1; Sodium 140  Recent Lipid Panel    Component Value Date/Time   CHOL 140 11/20/2020 1110   CHOL 150 04/24/2020 1426   TRIG 231.0 (H) 11/20/2020 1110   HDL 36.80 (L) 11/20/2020 1110   HDL 42 04/24/2020 1426   CHOLHDL 4 11/20/2020 1110   VLDL 46.2 (H) 11/20/2020 1110   LDLCALC 65 04/24/2020 1426   LDLDIRECT 77.0 11/20/2020 1110    Physical Exam:    VS:  BP 128/68 (BP Location: Right Arm, Patient Position: Sitting)   Pulse 60   Ht 5\' 7"  (1.702 m)   Wt 187 lb (84.8 kg)   SpO2 95%   BMI 29.29 kg/m     Wt Readings from Last  3  Encounters:  09/10/21 187 lb (84.8 kg)  04/16/21 187 lb 12.8 oz (85.2 kg)  01/30/21 183 lb (83 kg)     GEN:  Well nourished, well developed in no acute distress HEENT: Normal NECK: No JVD; No carotid bruits LYMPHATICS: No lymphadenopathy CARDIAC: RRR, no murmurs, rubs, gallops RESPIRATORY:  Clear to auscultation without rales, wheezing or rhonchi  ABDOMEN: Soft, non-tender, non-distended MUSCULOSKELETAL:  No edema; No deformity  SKIN: Warm and dry NEUROLOGIC:  Alert and oriented x 3 PSYCHIATRIC:  Normal affect    Signed, Norman Herrlich, MD  09/10/2021 3:39 PM    [facility]

## 2021-09-11 ENCOUNTER — Telehealth: Payer: Self-pay | Admitting: Cardiology

## 2021-09-11 DIAGNOSIS — I25118 Atherosclerotic heart disease of native coronary artery with other forms of angina pectoris: Secondary | ICD-10-CM

## 2021-09-11 MED ORDER — NITROGLYCERIN 0.4 MG SL SUBL: 0.4000 mg | SUBLINGUAL_TABLET | SUBLINGUAL | 2 refills | Status: DC | PRN

## 2021-09-11 NOTE — Telephone Encounter (Signed)
Nitrostat 0.4 mg sl # 100 tablets x 2 refills sent to [facility] [address] [place], [place]. AT [place]

## 2021-09-11 NOTE — Telephone Encounter (Signed)
*  STAT* If patient is at the pharmacy, call can be transferred to refill team.   1. Which medications need to be refilled? (please list name of each medication and dose if known) nitroGLYCERIN (NITROSTAT) 0.4 MG SL tablet  2. Which pharmacy/location (including street and city if local pharmacy) is medication to be sent to? [facility] [address] [place], [place]. AT [place]  3. Do they need a 30 day or 90 day supply? [place]

## 2021-09-18 DIAGNOSIS — L821 Other seborrheic keratosis: Secondary | ICD-10-CM | POA: Diagnosis not present

## 2021-09-18 DIAGNOSIS — D1801 Hemangioma of skin and subcutaneous tissue: Secondary | ICD-10-CM | POA: Diagnosis not present

## 2021-09-18 DIAGNOSIS — L720 Epidermal cyst: Secondary | ICD-10-CM | POA: Diagnosis not present

## 2021-09-18 DIAGNOSIS — L918 Other hypertrophic disorders of the skin: Secondary | ICD-10-CM | POA: Diagnosis not present

## 2021-09-18 DIAGNOSIS — D225 Melanocytic nevi of trunk: Secondary | ICD-10-CM | POA: Diagnosis not present

## 2021-09-18 DIAGNOSIS — L814 Other melanin hyperpigmentation: Secondary | ICD-10-CM | POA: Diagnosis not present

## 2021-09-18 DIAGNOSIS — Z85820 Personal history of malignant melanoma of skin: Secondary | ICD-10-CM | POA: Diagnosis not present

## 2021-09-23 ENCOUNTER — Other Ambulatory Visit: Payer: Self-pay | Admitting: Family

## 2021-09-23 DIAGNOSIS — E782 Mixed hyperlipidemia: Secondary | ICD-10-CM

## 2021-10-24 DIAGNOSIS — N401 Enlarged prostate with lower urinary tract symptoms: Secondary | ICD-10-CM | POA: Diagnosis not present

## 2021-10-29 NOTE — Progress Notes (Deleted)
Subjective:   Gerald [name] is a 76 y.o. male who presents for Medicare Annual/Subsequent preventive examination.  Review of Systems           Objective:    There were no vitals filed for this visit. There is no height or weight on file to calculate BMI.     10/25/2020    2:23 PM 04/28/2019    1:54 PM 04/20/2018    3:28 PM 10/27/2017    6:12 AM 09/14/2017    2:16 PM 04/13/2017   10:15 AM 07/29/2016   11:25 AM  Advanced Directives  Does Patient Have a Medical Advance Directive? Yes Yes Yes Yes Yes Yes Yes  Type of Paramedic of [place];Living will [place];Living will Living will;Healthcare Power of [place];Living will [place];Living will  Does patient want to make changes to medical advance directive?  No - Patient declined  No - Patient declined No - Patient declined No - Patient declined No - Patient declined  Copy of [place] in Chart? Yes - validated most recent copy scanned in chart (See row information) No - copy requested No - copy requested   No - copy requested No - copy requested    Current Medications (verified) Outpatient Encounter Medications as of 10/30/2021  Medication Sig   aspirin EC 81 MG tablet Take 1 tablet (81 mg total) by mouth daily.   Ciclopirox 1 % shampoo APP TO SCALP UTD AND PRN   famotidine (PEPCID) 20 MG tablet Take one tablet daily as needed for breakthrough GERD.   Glycerin-Polysorbate 80 (REFRESH DRY EYE THERAPY OP) Apply to eye as needed.   ketoconazole (NIZORAL) 2 % shampoo Apply 1 application topically as needed for irritation. For eczema   losartan (COZAAR) 100 MG tablet TAKE ONE TABLET BY MOUTH ONE TIME DAILY   Methylcellulose, Laxative, (CITRUCEL PO) Take by mouth as directed.   metoprolol tartrate (LOPRESSOR) 25 MG tablet TAKE ONE TABLET BY MOUTH TWICE A DAY   Multiple Vitamins-Minerals (MULTIVITAMIN ADULTS 50+ PO) Take 1  tablet by mouth daily.   nitroGLYCERIN (NITROSTAT) 0.4 MG SL tablet Place 1 tablet (0.4 mg total) under the tongue every 5 (five) minutes as needed for chest pain.   Omega-3 Fatty Acids (FISH OIL) 1200 MG CAPS Take 1 capsule by mouth daily.   omeprazole (PRILOSEC OTC) 20 MG tablet Take 20 mg by mouth in the morning.   rosuvastatin (CRESTOR) 5 MG tablet TAKE ONE TABLET BY MOUTH ONE TIME DAILY   tadalafil (CIALIS) 20 MG tablet Take 0.5-1 tablets (10-20 mg total) by mouth every other day as needed for erectile dysfunction.   No facility-administered encounter medications on file as of 10/30/2021.    Allergies (verified) Augmentin [amoxicillin-pot clavulanate], Tessalon [benzonatate], and Penicillins   History: Past Medical History:  Diagnosis Date   APC (atrial premature contractions) 10/18/2018   Back pain, chronic    Benign localized prostatic hyperplasia with lower urinary tract symptoms (LUTS)    Bladder outlet obstruction    Bleeding from urethra in male 10/26/2017   3 times over 7 hours   Chest pain in adult 10/18/2018   Depression    Diverticulosis of colon    Elevated PSA    Fatty liver    GERD (gastroesophageal reflux disease)    Hiatal hernia    History of alcohol abuse    quit 1989   History of esophageal stricture  2003  S/P  DILATATION   History of esophagitis    History of melanoma in situ    06/ 2017  REMOVAL RIGHT CHEEK AREA   History of sepsis 06/06/2016   admitted for urosepsis , UTI due to pseudomonas-- resolved    HTN (hypertension) 09/01/2011   Hx of adenomatous colonic polyps    2003   TUBULAR ADENOMA   Hyperlipidemia    Hypertension    Hypertensive left ventricular hypertrophy, without heart failure 06/05/2015   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   Mild sleep apnea    2004 mild, per sleep study-- no cpap recommended  (pt denies)   Past Surgical History:  Procedure Laterality Date   CARDIOVASCULAR STRESS TEST  04-17-2015  dr Claiborne Billings   Normal stress nulcear  study w/ a small, mild, fixed inferior defect consistent with inferior thinning; no ischemia; normal LV function and wall motion , stress ef 56%   COLONOSCOPY  last one 12-02-2006   CYSTOSCOPY WITH URETHRAL DILATATION N/A 10/27/2017   Procedure: CYSTOSCOPY WITH URETHRAL DILATATION;  Surgeon: Ceasar Mons, MD;  Location[facility];  Service: Urology;  Laterality: N/A;  ONLY NEEDS 90 MIN FOR ALL PROCEDURES   EXCISION LEFT ARM MASS  1988   MELANOMA EXCISION     MOHS SURGERY  09/06/2015   right parotidemomasseteric excision w/ complex repair for melanoma in situ   THULIUM LASER TURP (TRANSURETHRAL RESECTION OF PROSTATE) N/A 07/29/2016   Procedure: Marcelino Duster LASER TURP (TRANSURETHRAL RESECTION OF PROSTATE)/ TRANSRECTAL ULTRASOUND GUIDED PROSTATE BIOPSY;  Surgeon: Nickie Retort, MD;  Location[facility];  Service: Urology;  Laterality: N/A;   TONSILLECTOMY AND ADENOIDECTOMY  child   TRANSTHORACIC ECHOCARDIOGRAM  04/16/2015   moderate LVH,  ef 36-64%, grade 1 diastolic dysfunction/  trivial AR, MR and PR/  mild dilated ascending aorta/  mild TR   TRANSURETHRAL RESECTION OF PROSTATE N/A 10/27/2017   Procedure: TRANSURETHRAL RESECTION OF THE PROSTATE (TURP);  Surgeon: Ceasar Mons, MD;  Location[facility];  Service: Urology;  Laterality: N/A;   UPPER GASTROINTESTINAL ENDOSCOPY  last one 02-15-2014   Family History  Problem Relation Age of Onset   Melanoma Mother    Uterine cancer Mother    Coronary artery disease Father    Colon cancer Father        ? unknown age dx/died at 34   Coronary artery disease Maternal Grandmother    Colon cancer Maternal Grandmother 74   Heart attack Maternal Grandfather    Coronary artery disease Paternal Grandmother    Heart attack Paternal Grandfather    Hypertension Maternal Aunt    Prostate cancer Neg Hx    Stroke Neg Hx    Esophageal cancer Neg Hx    Pancreatic cancer Neg Hx     Rectal cancer Neg Hx    Stomach cancer Neg Hx    Social History   Socioeconomic History   Marital status: Married    Spouse name: Not on file   Number of children: 2   Years of education: Not on file   Highest education level: Not on file  Occupational History   Occupation: Retired  Tobacco Use   Smoking status: Former    Packs/day: 2.00    Years: 30.00    Total pack years: 60.00    Types: Cigarettes    Quit date: 08/29/2006    Years since quitting: 15.1    Passive exposure: Past   Smokeless tobacco: Never  Vaping Use   Vaping Use: Never used  Substance and Sexual Activity   Alcohol use: No   Drug use: No   Sexual activity: Yes  Other Topics Concern   Not on file  Social History Narrative   Diet: regular, lots of vegetables-----exercise : none but active   Caffeine use:  5 cups coffee daily         Social Determinants of Health   Financial Resource Strain: Low Risk  (10/25/2020)   Overall Financial Resource Strain (CARDIA)    Difficulty of Paying Living Expenses: Not hard at all  Food Insecurity: No Food Insecurity (10/25/2020)   Hunger Vital Sign    Worried About Running Out of Food in the Last Year: Never true    Ran Out of Food in the Last Year: Never true  Transportation Needs: No Transportation Needs (10/25/2020)   PRAPARE - Hydrologist (Medical): No    Lack of Transportation (Non-Medical): No  Physical Activity: Insufficiently Active (10/25/2020)   Exercise Vital Sign    Days of Exercise per Week: 5 days    Minutes of Exercise per Session: 10 min  Stress: No Stress Concern Present (10/25/2020)   [place]    Feeling of Stress : Not at all  Social Connections: Socially Integrated (10/25/2020)   Social Connection and Isolation Panel [NHANES]    Frequency of Communication with Friends and Family: More than three times a week    Frequency of Social Gatherings with  Friends and Family: More than three times a week    Attends Religious Services: More than 4 times per year    Active Member of [employer] or Organizations: Yes    Attends Music therapist: More than 4 times per year    Marital Status: Married    Tobacco Counseling Counseling given: Not Answered   Clinical Intake:                 Diabetic?no         Activities of Daily Living     No data to display          Patient Care Team: Debbrah Alar, NP as PCP - General (Internal Medicine)  Indicate any recent Medical Services you may have received from other than [facility] providers in the past year (date may be approximate).     Assessment:   This is a routine wellness examination for Gerald [name].  Hearing/Vision screen No results found.  Dietary issues and exercise activities discussed:     Goals Addressed   None    Depression Screen    10/25/2020    2:24 PM 04/28/2019    1:58 PM 04/20/2018    3:29 PM 04/13/2017   10:15 AM 04/04/2016   11:31 AM 09/26/2015   10:36 AM 03/19/2015    8:25 AM  PHQ 2/9 Scores  PHQ - 2 Score 0 0 0 0 0 0 0    Fall Risk    10/25/2020    2:24 PM 04/28/2019    1:57 PM 04/20/2018    3:29 PM 04/13/2017   10:15 AM 04/04/2016   11:31 AM  [place] in the past year? 0 0 0 No No  Number falls in past yr: 0 0     Injury with Fall? 0 0     Follow up Falls prevention discussed Education provided;Falls prevention discussed       FALL  RISK PREVENTION PERTAINING TO THE HOME:  Any stairs in or around the home? {YES/NO:21197} If so, are there any without handrails? {YES/NO:21197} Home free of loose throw rugs in walkways, pet beds, electrical cords, etc? {YES/NO:21197} Adequate lighting in your home to reduce risk of falls? {YES/NO:21197}  ASSISTIVE DEVICES UTILIZED TO PREVENT FALLS:  Life alert? {YES/NO:21197} Use of a cane, walker or w/c? {YES/NO:21197} Grab bars in the bathroom? {YES/NO:21197} Shower chair or bench  in shower? {YES/NO:21197} Elevated toilet seat or a handicapped toilet? {YES/NO:21197}  TIMED UP AND GO:  Was the test performed? {YES/NO:21197}.  Length of time to ambulate 10 feet: *** sec.   {Appearance of OXBD:5329924}  Cognitive Function:    04/13/2017   10:15 AM 04/04/2016   11:31 AM  MMSE - Mini Mental State Exam  Orientation to time 5 5  Orientation to Place 5 5  Registration 3 3  Attention/ Calculation 5 5  Recall 3 2  Language- name 2 objects 2 2  Language- repeat 1 1  Language- follow 3 step command 3 3  Language- read & follow direction 1 1  Write a sentence 1 1  Copy design 1 1  Total score 30 29        Immunizations Immunization History  Administered Date(s) Administered   Fluad Quad(high Dose 65+) 12/20/2020   Influenza Split 12/30/2010, 01/06/2012, 02/03/2017   Influenza Whole 02/18/2007, 03/13/2009, 01/31/2010   Influenza, High Dose Seasonal PF 01/29/2017, 12/29/2017, 12/14/2018   Influenza-Unspecified 01/29/2013, 12/29/2013, 01/15/2016   PFIZER(Purple Top)SARS-COV-2 Vaccination 05/14/2019, 06/06/2019, 01/19/2020   Pfizer Covid-19 Vaccine Bivalent Booster 64yr & up 12/20/2020   Pneumococcal Conjugate-13 03/15/2014   Pneumococcal Polysaccharide-23 05/29/2005, 01/06/2012   Td 04/20/2001, 03/15/2014   Zoster, Live 08/27/2011    TDAP status: Up to date  Flu Vaccine status: Up to date  Pneumococcal vaccine status: Up to date  Covid-19 vaccine status: Completed vaccines  Qualifies for Shingles Vaccine? Yes   Zostavax completed No   {Shingrix Completed?:2101804}  Screening Tests Health Maintenance  Topic Date Due   Zoster Vaccines- Shingrix (1 of 2) Never done   INFLUENZA VACCINE  10/29/2021   TETANUS/TDAP  03/15/2024   COLONOSCOPY (Pts 45-427yrInsurance coverage will need to be confirmed)  03/28/2027   Pneumonia Vaccine 75Years old  Completed   COVID-19 Vaccine  Completed   Hepatitis C Screening  Completed   HPV VACCINES  Aged Out     Health Maintenance  Health Maintenance Due  Topic Date Due   Zoster Vaccines- Shingrix (1 of 2) Never done   INFLUENZA VACCINE  10/29/2021    Colorectal cancer screening: Type of screening: Colonoscopy. Completed 03/27/17. Repeat every 10 years  Lung Cancer Screening: (Low Dose CT Chest recommended if Age 75-80ears, 30 pack-year currently smoking OR have quit w/in 15years.) does not qualify.   Lung Cancer Screening Referral: n/a  Additional Screening:  Hepatitis C Screening: does qualify; Completed 03/19/15  Vision Screening: Recommended annual ophthalmology exams for early detection of glaucoma and other disorders of the eye. Is the patient up to date with their annual eye exam?  {YES/NO:21197} Who is the provider or what is the name of the office in which the patient attends annual eye exams? *** If pt is not established with a provider, would they like to be referred to a provider to establish care? {YES/NO:21197}.   Dental Screening: Recommended annual dental exams for proper oral hygiene  Community Resource Referral / Chronic Care Management: CRR required this visit?  {  YES/NO:21197}  CCM required this visit?  {YES/NO:21197}     Plan:     I have personally reviewed and noted the following in the patient's chart:   Medical and social history Use of alcohol, tobacco or illicit drugs  Current medications and supplements including opioid prescriptions. {Opioid Prescriptions:[phone]} Functional ability and status Nutritional status Physical activity Advanced directives List of other physicians Hospitalizations, surgeries, and ER visits in previous 12 months Vitals Screenings to include cognitive, depression, and falls Referrals and appointments  In addition, I have reviewed and discussed with patient certain preventive protocols, quality metrics, and best practice recommendations. A written personalized care plan for preventive services as well as general  preventive health recommendations were provided to patient.     Duard Brady Lenton [place], [place]   10/29/2021   Nurse Notes: ***

## 2021-10-30 ENCOUNTER — Ambulatory Visit: Payer: Medicare Other

## 2021-10-30 DIAGNOSIS — M79672 Pain in left foot: Secondary | ICD-10-CM | POA: Diagnosis not present

## 2021-10-30 DIAGNOSIS — B351 Tinea unguium: Secondary | ICD-10-CM | POA: Diagnosis not present

## 2021-10-30 DIAGNOSIS — M79671 Pain in right foot: Secondary | ICD-10-CM | POA: Diagnosis not present

## 2021-10-31 DIAGNOSIS — R972 Elevated prostate specific antigen [PSA]: Secondary | ICD-10-CM | POA: Diagnosis not present

## 2021-10-31 DIAGNOSIS — N401 Enlarged prostate with lower urinary tract symptoms: Secondary | ICD-10-CM | POA: Diagnosis not present

## 2021-10-31 DIAGNOSIS — R3912 Poor urinary stream: Secondary | ICD-10-CM | POA: Diagnosis not present

## 2021-11-28 ENCOUNTER — Telehealth: Payer: Self-pay | Admitting: Family

## 2021-11-28 NOTE — Telephone Encounter (Signed)
Left message for patient to call back and schedule Medicare Annual Wellness Visit (AWV).   Please offer to do virtually or by telephone.  Left office number and my jabber [phone].  Last AWV:10/25/2020  Please schedule at anytime with Nurse Health Advisor.

## 2021-12-21 ENCOUNTER — Telehealth: Payer: Self-pay | Admitting: Family

## 2021-12-23 NOTE — Telephone Encounter (Signed)
Wife answered, stated he will call back.

## 2021-12-23 NOTE — Telephone Encounter (Signed)
Please contact pt to schedule OV.

## 2021-12-30 ENCOUNTER — Encounter: Payer: Self-pay | Admitting: Family

## 2021-12-31 ENCOUNTER — Ambulatory Visit (INDEPENDENT_AMBULATORY_CARE_PROVIDER_SITE_OTHER): Payer: Medicare Other | Admitting: Family

## 2021-12-31 VITALS — BP 123/64 | HR 62 | Temp 98.3°F | Resp 18 | Wt 190.0 lb

## 2021-12-31 DIAGNOSIS — I10 Essential (primary) hypertension: Secondary | ICD-10-CM | POA: Diagnosis not present

## 2021-12-31 DIAGNOSIS — E782 Mixed hyperlipidemia: Secondary | ICD-10-CM

## 2021-12-31 DIAGNOSIS — I25118 Atherosclerotic heart disease of native coronary artery with other forms of angina pectoris: Secondary | ICD-10-CM | POA: Diagnosis not present

## 2021-12-31 DIAGNOSIS — K219 Gastro-esophageal reflux disease without esophagitis: Secondary | ICD-10-CM

## 2021-12-31 MED ORDER — METOPROLOL TARTRATE 25 MG PO TABS: 25.0000 mg | ORAL_TABLET | Freq: Two times a day (BID) | ORAL | 1 refills | Status: DC

## 2021-12-31 MED ORDER — LOSARTAN POTASSIUM 100 MG PO TABS: 100.0000 mg | ORAL_TABLET | Freq: Every day | ORAL | 1 refills | Status: DC

## 2021-12-31 NOTE — Assessment & Plan Note (Signed)
Tolerating crestor. Continue same, obtain follow up lipid panel.

## 2021-12-31 NOTE — Assessment & Plan Note (Signed)
BP stable. Continue metoprolol and losartan.

## 2021-12-31 NOTE — Progress Notes (Signed)
Subjective:     Patient ID: Gerald [name], male    DOB: [birth year], 76 y.o.   MRN: [id number]  Chief Complaint  Patient presents with   Follow-up    Concerns/ questions: none Flu shot today: declines     HPI  Patient is in today for follow up. Declines flu shot today will get at the pharmacy.   Hyperlipidemia-  Lab Results  Component Value Date   CHOL 140 11/20/2020   HDL 36.80 (L) 11/20/2020   LDLCALC 65 04/24/2020   LDLDIRECT 77.0 11/20/2020   TRIG 231.0 (H) 11/20/2020   CHOLHDL 4 11/20/2020   Maintained on crestor.   HTN-  BP Readings from Last 3 Encounters:  12/31/21 123/64  09/10/21 128/68  04/16/21 130/70   Maintained on losartan 100mg  and metoprolol 25mg .   GERD- on omeprazole 20mg  once daily- rare use of prn pepcid.    Health Maintenance Due  Topic Date Due   Zoster Vaccines- Shingrix (1 of 2) Never done   COVID-19 Vaccine (5 - Pfizer risk series) 02/14/2021   INFLUENZA VACCINE  10/29/2021    Past Medical History:  Diagnosis Date   APC (atrial premature contractions) 10/18/2018   Back pain, chronic    Benign localized prostatic hyperplasia with lower urinary tract symptoms (LUTS)    Bladder outlet obstruction    Bleeding from urethra in male 10/26/2017   3 times over 7 hours   Chest pain in adult 10/18/2018   Depression    Diverticulosis of colon    Elevated PSA    Fatty liver    GERD (gastroesophageal reflux disease)    Hiatal hernia    History of alcohol abuse    quit 1989   History of esophageal stricture    2003  S/P  DILATATION   History of esophagitis    History of melanoma in situ    06/ 2017  REMOVAL RIGHT CHEEK AREA   History of sepsis 06/06/2016   admitted for urosepsis , UTI due to pseudomonas-- resolved    HTN (hypertension) 09/01/2011   Hx of adenomatous colonic polyps    2003   TUBULAR ADENOMA   Hyperlipidemia    Hypertension    Hypertensive left ventricular hypertrophy, without heart failure 06/05/2015   Left anterior  hemiblock 04/07/2015   Mild sleep apnea    2004 mild, per sleep study-- no cpap recommended  (pt denies)    Past Surgical History:  Procedure Laterality Date   CARDIOVASCULAR STRESS TEST  04-17-2015  dr Tresa Endo   Normal stress nulcear study w/ a small, mild, fixed inferior defect consistent with inferior thinning; no ischemia; normal LV function and wall motion , stress ef 56%   COLONOSCOPY  last one 12-02-2006   CYSTOSCOPY WITH URETHRAL DILATATION N/A 10/27/2017   Procedure: CYSTOSCOPY WITH URETHRAL DILATATION;  Surgeon: Rene Paci, MD;  Location: [facility];  Service: Urology;  Laterality: N/A;  ONLY NEEDS 90 MIN FOR ALL PROCEDURES   EXCISION LEFT ARM MASS  1988   MELANOMA EXCISION     MOHS SURGERY  09/06/2015   right parotidemomasseteric excision w/ complex repair for melanoma in situ   THULIUM LASER TURP (TRANSURETHRAL RESECTION OF PROSTATE) N/A 07/29/2016   Procedure: Morton Peters LASER TURP (TRANSURETHRAL RESECTION OF PROSTATE)/ TRANSRECTAL ULTRASOUND GUIDED PROSTATE BIOPSY;  Surgeon: Hildred Laser, MD;  Location: [facility];  Service: Urology;  Laterality: N/A;   TONSILLECTOMY AND ADENOIDECTOMY  child   TRANSTHORACIC ECHOCARDIOGRAM  04/16/2015  moderate LVH,  ef 60-65%, grade 1 diastolic dysfunction/  trivial AR, MR and PR/  mild dilated ascending aorta/  mild TR   TRANSURETHRAL RESECTION OF PROSTATE N/A 10/27/2017   Procedure: TRANSURETHRAL RESECTION OF THE PROSTATE (TURP);  Surgeon: Rene Paci, MD;  Location: [facility];  Service: Urology;  Laterality: N/A;   UPPER GASTROINTESTINAL ENDOSCOPY  last one 02-15-2014    Family History  Problem Relation Age of Onset   Melanoma Mother    Uterine cancer Mother    Coronary artery disease Father    Colon cancer Father        ? unknown age dx/died at 36   Coronary artery disease Maternal Grandmother    Colon cancer Maternal Grandmother 74   Heart attack Maternal  Grandfather    Coronary artery disease Paternal Grandmother    Heart attack Paternal Grandfather    Hypertension Maternal Aunt    Prostate cancer Neg Hx    Stroke Neg Hx    Esophageal cancer Neg Hx    Pancreatic cancer Neg Hx    Rectal cancer Neg Hx    Stomach cancer Neg Hx     Social History   Socioeconomic History   Marital status: Married    Spouse name: Not on file   Number of children: 2   Years of education: Not on file   Highest education level: Not on file  Occupational History   Occupation: Retired  Tobacco Use   Smoking status: Former    Packs/day: 2.00    Years: 30.00    Total pack years: 60.00    Types: Cigarettes    Quit date: 08/29/2006    Years since quitting: 15.3    Passive exposure: Past   Smokeless tobacco: Never  Vaping Use   Vaping Use: Never used  Substance and Sexual Activity   Alcohol use: No   Drug use: No   Sexual activity: Yes  Other Topics Concern   Not on file  Social History Narrative   Diet: regular, lots of vegetables-----exercise : none but active   Caffeine use:  5 cups coffee daily         Social Determinants of Health   Financial Resource Strain: Low Risk  (10/25/2020)   Overall Financial Resource Strain (CARDIA)    Difficulty of Paying Living Expenses: Not hard at all  Food Insecurity: No Food Insecurity (10/25/2020)   Hunger Vital Sign    Worried About Running Out of Food in the Last Year: Never true    Ran Out of Food in the Last Year: Never true  Transportation Needs: No Transportation Needs (10/25/2020)   PRAPARE - Administrator, Civil Service (Medical): No    Lack of Transportation (Non-Medical): No  Physical Activity: Insufficiently Active (10/25/2020)   Exercise Vital Sign    Days of Exercise per Week: 5 days    Minutes of Exercise per Session: 10 min  Stress: No Stress Concern Present (10/25/2020)   [employer] of Occupational Health - Occupational Stress Questionnaire    Feeling of Stress : Not  at all  Social Connections: Socially Integrated (10/25/2020)   Social Connection and Isolation Panel [NHANES]    Frequency of Communication with Friends and Family: More than three times a week    Frequency of Social Gatherings with Friends and Family: More than three times a week    Attends Religious Services: More than 4 times per year    Active Member of [employer]  or Organizations: Yes    Attends Banker Meetings: More than 4 times per year    Marital Status: Married  Catering manager Violence: Not At Risk (10/25/2020)   Humiliation, Afraid, Rape, and Kick questionnaire    Fear of Current or Ex-Partner: No    Emotionally Abused: No    Physically Abused: No    Sexually Abused: No    Outpatient Medications Prior to Visit  Medication Sig Dispense Refill   aspirin EC 81 MG tablet Take 1 tablet (81 mg total) by mouth daily. 90 tablet 3   Ciclopirox 1 % shampoo APP TO SCALP UTD AND PRN  2   famotidine (PEPCID) 20 MG tablet Take one tablet daily as needed for breakthrough GERD.     Glycerin-Polysorbate 80 (REFRESH DRY EYE THERAPY OP) Apply to eye as needed.     ketoconazole (NIZORAL) 2 % shampoo Apply 1 application topically as needed for irritation. For eczema     Methylcellulose, Laxative, (CITRUCEL PO) Take by mouth as directed.     Multiple Vitamins-Minerals (MULTIVITAMIN ADULTS 50+ PO) Take 1 tablet by mouth daily.     Omega-3 Fatty Acids (FISH OIL) 1200 MG CAPS Take 1 capsule by mouth daily.     omeprazole (PRILOSEC OTC) 20 MG tablet Take 20 mg by mouth in the morning.     rosuvastatin (CRESTOR) 5 MG tablet TAKE ONE TABLET BY MOUTH ONE TIME DAILY 90 tablet 1   tadalafil (CIALIS) 20 MG tablet Take 0.5-1 tablets (10-20 mg total) by mouth every other day as needed for erectile dysfunction. 10 tablet 11   losartan (COZAAR) 100 MG tablet TAKE ONE TABLET BY MOUTH ONE TIME DAILY 30 tablet 0   metoprolol tartrate (LOPRESSOR) 25 MG tablet TAKE ONE TABLET BY MOUTH TWICE A DAY 180 tablet  1   nitroGLYCERIN (NITROSTAT) 0.4 MG SL tablet Place 1 tablet (0.4 mg total) under the tongue every 5 (five) minutes as needed for chest pain. 100 tablet 2   No facility-administered medications prior to visit.    Allergies  Allergen Reactions   Augmentin [Amoxicillin-Pot Clavulanate] Diarrhea    *Can take plain Amoxicillin* Has patient had a PCN reaction causing immediate rash, facial/tongue/throat swelling, SOB or lightheadedness with hypotension: No Has patient had a PCN reaction causing severe rash involving mucus membranes or skin necrosis: No Has patient had a PCN reaction that required hospitalization No Has patient had a PCN reaction occurring within the last 10 years: Yes If all of the above answers are "NO", then may proceed with Cephalosporin use.    Tessalon [Benzonatate]     Deep uncontrollable cough   Penicillins Rash    Childhood reaction *Can take Amoxicillin* Has patient had a PCN reaction causing immediate rash, facial/tongue/throat swelling, SOB or lightheadedness with hypotension: Unknown Has patient had a PCN reaction causing severe rash involving mucus membranes or skin necrosis: Unknown Has patient had a PCN reaction that required hospitalization: Unknown Has patient had a PCN reaction occurring within the last 10 years: Unknown  If all of the above answers are "NO", then may proceed with Cephalosporin use    ROS    See HPI Objective:    Physical Exam Constitutional:      General: He is not in acute distress.    Appearance: He is well-developed.  HENT:     Head: Normocephalic and atraumatic.  Cardiovascular:     Rate and Rhythm: Normal rate and regular rhythm.     Heart sounds: No  murmur heard. Pulmonary:     Effort: Pulmonary effort is normal. No respiratory distress.     Breath sounds: Normal breath sounds. No wheezing or rales.  Skin:    General: Skin is warm and dry.  Neurological:     Mental Status: He is alert and oriented to person, place,  and time.  Psychiatric:        Behavior: Behavior normal.        Thought Content: Thought content normal.     BP 123/64 (BP Location: Left Arm, Patient Position: Sitting, Cuff Size: Normal)   Pulse 62   Temp 98.3 F (36.8 C) (Oral)   Resp 18   Wt 190 lb (86.2 kg)   SpO2 97%   BMI 29.76 kg/m  Wt Readings from Last 3 Encounters:  12/31/21 190 lb (86.2 kg)  09/10/21 187 lb (84.8 kg)  04/16/21 187 lb 12.8 oz (85.2 kg)       Assessment & Plan:   Problem List Items Addressed This Visit       Unprioritized   Hypertension    BP stable. Continue metoprolol and losartan.       Relevant Medications   metoprolol tartrate (LOPRESSOR) 25 MG tablet   losartan (COZAAR) 100 MG tablet   Hyperlipidemia - Primary    Tolerating crestor. Continue same, obtain follow up lipid panel.       Relevant Medications   metoprolol tartrate (LOPRESSOR) 25 MG tablet   losartan (COZAAR) 100 MG tablet   Other Relevant Orders   Lipid panel   Comp Met (CMET)   Gastroesophageal reflux disease    Stable on omeprazole. Continue same.        I have changed Chase Picket. Rumore "Ben"'s metoprolol tartrate and losartan. I am also having him maintain his ketoconazole, Multiple Vitamins-Minerals (MULTIVITAMIN ADULTS 50+ PO), Ciclopirox, Glycerin-Polysorbate 80 (REFRESH DRY EYE THERAPY OP), Fish Oil, aspirin EC, tadalafil, (Methylcellulose, Laxative, (CITRUCEL PO)), famotidine, omeprazole, nitroGLYCERIN, and rosuvastatin.  Meds ordered this encounter  Medications   metoprolol tartrate (LOPRESSOR) 25 MG tablet    Sig: Take 1 tablet (25 mg total) by mouth 2 (two) times daily.    Dispense:  180 tablet    Refill:  1    Order Specific Question:   Supervising Provider    Answer:   Danise Edge A [[id number]]   losartan (COZAAR) 100 MG tablet    Sig: Take 1 tablet (100 mg total) by mouth daily.    Dispense:  90 tablet    Refill:  1    Order Specific Question:   Supervising Provider    Answer:   Danise Edge A  [[id number]]

## 2021-12-31 NOTE — Assessment & Plan Note (Signed)
Stable on omeprazole. Continue same.  ?

## 2022-01-01 LAB — COMPREHENSIVE METABOLIC PANEL
ALT: 27 U/L (ref 0–53)
AST: 20 U/L (ref 0–37)
Albumin: 4.1 g/dL (ref 3.5–5.2)
Alkaline Phosphatase: 83 U/L (ref 39–117)
BUN: 21 mg/dL (ref 6–23)
CO2: 28 mEq/L (ref 19–32)
Calcium: 9.1 mg/dL (ref 8.4–10.5)
Chloride: 106 mEq/L (ref 96–112)
Creatinine, Ser: 1.08 mg/dL (ref 0.40–1.50)
GFR: 67.23 mL/min (ref >60.00)
Glucose, Bld: 92 mg/dL (ref 70–99)
Potassium: 4 mEq/L (ref 3.5–5.1)
Sodium: 143 mEq/L (ref 135–145)
Total Bilirubin: 0.5 mg/dL (ref 0.2–1.2)
Total Protein: 6.4 g/dL (ref 6.0–8.3)

## 2022-01-01 LAB — LIPID PANEL
Cholesterol: 154 mg/dL (ref 0–200)
HDL: 36.9 mg/dL — ABNORMAL LOW (ref >39.00)
NonHDL: 116.68
Total CHOL/HDL Ratio: 4
Triglycerides: 343 mg/dL — ABNORMAL HIGH (ref 0.0–149.0)
VLDL: 68.6 mg/dL — ABNORMAL HIGH (ref 0.0–40.0)

## 2022-01-01 LAB — LDL CHOLESTEROL, DIRECT: Direct LDL: 82 mg/dL

## 2022-01-02 ENCOUNTER — Telehealth: Payer: Self-pay | Admitting: Family

## 2022-01-02 MED ORDER — FISH OIL 1000 MG PO CPDR: 3000.0000 mg | DELAYED_RELEASE_CAPSULE | Freq: Two times a day (BID) | ORAL | Status: AC

## 2022-01-02 NOTE — Telephone Encounter (Signed)
See mychart.  

## 2022-01-14 ENCOUNTER — Encounter: Payer: Self-pay | Admitting: Family

## 2022-01-15 DIAGNOSIS — Z23 Encounter for immunization: Secondary | ICD-10-CM | POA: Diagnosis not present

## 2022-01-20 ENCOUNTER — Other Ambulatory Visit: Payer: Self-pay | Admitting: Family

## 2022-01-30 DIAGNOSIS — M79671 Pain in right foot: Secondary | ICD-10-CM | POA: Diagnosis not present

## 2022-01-30 DIAGNOSIS — M79672 Pain in left foot: Secondary | ICD-10-CM | POA: Diagnosis not present

## 2022-01-30 DIAGNOSIS — B351 Tinea unguium: Secondary | ICD-10-CM | POA: Diagnosis not present

## 2022-02-07 ENCOUNTER — Other Ambulatory Visit: Payer: Self-pay | Admitting: Family

## 2022-03-11 ENCOUNTER — Encounter: Payer: Self-pay | Admitting: Family

## 2022-03-11 DIAGNOSIS — M79602 Pain in left arm: Secondary | ICD-10-CM

## 2022-03-12 NOTE — Addendum Note (Signed)
Addended by: Debbrah Alar on: 03/12/2022 11:37 AM   Modules accepted: Orders

## 2022-03-17 NOTE — Progress Notes (Deleted)
Gerald [name] D.[place] Phone: [phone]   Assessment and Plan:     There are no diagnoses linked to this encounter.  ***   Pertinent previous records reviewed include ***   Follow Up: ***     Subjective:   I, Gerald [name], am serving as a Education administrator for Doctor Gerald [name]  Chief Complaint: left arm pain   HPI:   03/18/2022 Patient is a 76 year old male complaining of left arm pain. Patient states  Relevant Historical Information: ***  Additional pertinent review of systems negative.   Current Outpatient Medications:    aspirin EC 81 MG tablet, Take 1 tablet (81 mg total) by mouth daily., Disp: 90 tablet, Rfl: 3   Ciclopirox 1 % shampoo, APP TO SCALP UTD AND PRN, Disp: , Rfl: 2   famotidine (PEPCID) 20 MG tablet, Take one tablet daily as needed for breakthrough GERD., Disp: , Rfl:    Glycerin-Polysorbate 80 (REFRESH DRY EYE THERAPY OP), Apply to eye as needed., Disp: , Rfl:    ketoconazole (NIZORAL) 2 % shampoo, Apply 1 application topically as needed for irritation. For eczema, Disp: , Rfl:    losartan (COZAAR) 100 MG tablet, TAKE ONE TABLET BY MOUTH ONE TIME DAILY, Disp: 90 tablet, Rfl: 1   Methylcellulose, Laxative, (CITRUCEL PO), Take by mouth as directed., Disp: , Rfl:    metoprolol tartrate (LOPRESSOR) 25 MG tablet, Take 1 tablet (25 mg total) by mouth 2 (two) times daily., Disp: 180 tablet, Rfl: 1   Multiple Vitamins-Minerals (MULTIVITAMIN ADULTS 50+ PO), Take 1 tablet by mouth daily., Disp: , Rfl:    nitroGLYCERIN (NITROSTAT) 0.4 MG SL tablet, Place 1 tablet (0.4 mg total) under the tongue every 5 (five) minutes as needed for chest pain., Disp: 100 tablet, Rfl: 2   Omega-3 Fatty Acids (FISH OIL) 1000 MG CPDR, Take 3,000 mg by mouth 2 (two) times daily., Disp: , Rfl:    omeprazole (PRILOSEC OTC) 20 MG tablet, Take 20 mg by mouth in the morning., Disp: , Rfl:    rosuvastatin  (CRESTOR) 5 MG tablet, TAKE ONE TABLET BY MOUTH ONE TIME DAILY, Disp: 90 tablet, Rfl: 1   tadalafil (CIALIS) 20 MG tablet, TAKE ONE-HALF TO ONE TABLET BY MOUTH ONE TIME EVERY OTHER DAY AS NEEDED FOR ERECTILE DYSFUNCTION, Disp: 10 tablet, Rfl: 11   Objective:     There were no vitals filed for this visit.    There is no height or weight on file to calculate BMI.    Physical Exam:    ***   Electronically signed by:  Gerald [name] D.[name] 12:34 PM 03/17/22

## 2022-03-18 ENCOUNTER — Ambulatory Visit: Payer: Medicare Other | Admitting: Sports Medicine

## 2022-03-19 ENCOUNTER — Ambulatory Visit (INDEPENDENT_AMBULATORY_CARE_PROVIDER_SITE_OTHER): Payer: Medicare Other

## 2022-03-19 VITALS — Wt 184.0 lb

## 2022-03-19 DIAGNOSIS — Z0000 Encounter for general adult medical examination without abnormal findings: Secondary | ICD-10-CM | POA: Diagnosis not present

## 2022-03-19 NOTE — Patient Instructions (Signed)
Gerald [name] , Thank you for taking time to come for your Medicare Wellness Visit. I appreciate your ongoing commitment to your health goals. Please review the following plan we discussed and let me know if I can assist you in the future.   These are the goals we discussed:  Goals      Increase physical activity     Walk 32mn 3 days/ week     Patient Stated     None at this time      Weight (lb) < 175 lb (79.4 kg)        This is a list of the screening recommended for you and due dates:  Health Maintenance  Topic Date Due   COVID-19 Vaccine (5 - 2023-24 season) 11/29/2021   Zoster (Shingles) Vaccine (1 of 2) 06/18/2022*   Medicare Annual Wellness Visit  03/20/2023   DTaP/Tdap/Td vaccine (3 - Tdap) 03/15/2024   Colon Cancer Screening  03/28/2027   Pneumonia Vaccine  Completed   Flu Shot  Completed   Hepatitis C Screening: USPSTF Recommendation to screen - Ages 18-79 yo.  Completed   HPV Vaccine  Aged Out  *Topic was postponed. The date shown is not the original due date.    Advanced directives: copies in chart  Conditions/risks identified: none at this time   Next appointment: Follow up in one year for your annual wellness visit.   Preventive Care 686Years and Older, Male  Preventive care refers to lifestyle choices and visits with your health care provider that can promote health and wellness. What does preventive care include? A yearly physical exam. This is also called an annual well check. Dental exams once or twice a year. Routine eye exams. Ask your health care provider how often you should have your eyes checked. Personal lifestyle choices, including: Daily care of your teeth and gums. Regular physical activity. Eating a healthy diet. Avoiding tobacco and drug use. Limiting alcohol use. Practicing safe sex. Taking low doses of aspirin every day. Taking vitamin and mineral supplements as recommended by your health care provider. What happens during an annual  well check? The services and screenings done by your health care provider during your annual well check will depend on your age, overall health, lifestyle risk factors, and family history of disease. Counseling  Your health care provider may ask you questions about your: Alcohol use. Tobacco use. Drug use. Emotional well-being. Home and relationship well-being. Sexual activity. Eating habits. History of falls. Memory and ability to understand (cognition). Work and work eStatistician Screening  You may have the following tests or measurements: Height, weight, and BMI. Blood pressure. Lipid and cholesterol levels. These may be checked every 5 years, or more frequently if you are over 551years old. Skin check. Lung cancer screening. You may have this screening every year starting at age 1075if you have a 30-pack-year history of smoking and currently smoke or have quit within the past 15 years. Fecal occult blood test (FOBT) of the stool. You may have this test every year starting at age 75 Flexible sigmoidoscopy or colonoscopy. You may have a sigmoidoscopy every 5 years or a colonoscopy every 10 years starting at age 75 Prostate cancer screening. Recommendations will vary depending on your family history and other risks. Hepatitis C blood test. Hepatitis B blood test. Sexually transmitted disease (STD) testing. Diabetes screening. This is done by checking your blood sugar (glucose) after you have not eaten for a while (fasting). You may have this  done every 1-3 years. Abdominal aortic aneurysm (AAA) screening. You may need this if you are a current or former smoker. Osteoporosis. You may be screened starting at age 56 if you are at high risk. Talk with your health care provider about your test results, treatment options, and if necessary, the need for more tests. Vaccines  Your health care provider may recommend certain vaccines, such as: Influenza vaccine. This is recommended every  year. Tetanus, diphtheria, and acellular pertussis (Tdap, Td) vaccine. You may need a Td booster every 10 years. Zoster vaccine. You may need this after age 45. Pneumococcal 13-valent conjugate (PCV13) vaccine. One dose is recommended after age 41. Pneumococcal polysaccharide (PPSV23) vaccine. One dose is recommended after age 33. Talk to your health care provider about which screenings and vaccines you need and how often you need them. This information is not intended to replace advice given to you by your health care provider. Make sure you discuss any questions you have with your health care provider. Document Released: 04/13/2015 Document Revised: 12/05/2015 Document Reviewed: 01/16/2015 Elsevier Interactive Patient Education  2017 [place] Prevention in the Home Falls can cause injuries. They can happen to people of all ages. There are many things you can do to make your home safe and to help prevent falls. What can I do on the outside of my home? Regularly fix the edges of walkways and driveways and fix any cracks. Remove anything that might make you trip as you walk through a door, such as a raised step or threshold. Trim any bushes or trees on the path to your home. Use bright outdoor lighting. Clear any walking paths of anything that might make someone trip, such as rocks or tools. Regularly check to see if handrails are loose or broken. Make sure that both sides of any steps have handrails. Any raised decks and porches should have guardrails on the edges. Have any leaves, snow, or ice cleared regularly. Use sand or salt on walking paths during winter. Clean up any spills in your garage right away. This includes oil or grease spills. What can I do in the bathroom? Use night lights. Install grab bars by the toilet and in the tub and shower. Do not use towel bars as grab bars. Use non-skid mats or decals in the tub or shower. If you need to sit down in the shower, use a  plastic, non-slip stool. Keep the floor dry. Clean up any water that spills on the floor as soon as it happens. Remove soap buildup in the tub or shower regularly. Attach bath mats securely with double-sided non-slip rug tape. Do not have throw rugs and other things on the floor that can make you trip. What can I do in the bedroom? Use night lights. Make sure that you have a light by your bed that is easy to reach. Do not use any sheets or blankets that are too big for your bed. They should not hang down onto the floor. Have a firm chair that has side arms. You can use this for support while you get dressed. Do not have throw rugs and other things on the floor that can make you trip. What can I do in the kitchen? Clean up any spills right away. Avoid walking on wet floors. Keep items that you use a lot in easy-to-reach places. If you need to reach something above you, use a strong step stool that has a grab bar. Keep electrical cords out of  the way. Do not use floor polish or wax that makes floors slippery. If you must use wax, use non-skid floor wax. Do not have throw rugs and other things on the floor that can make you trip. What can I do with my stairs? Do not leave any items on the stairs. Make sure that there are handrails on both sides of the stairs and use them. Fix handrails that are broken or loose. Make sure that handrails are as long as the stairways. Check any carpeting to make sure that it is firmly attached to the stairs. Fix any carpet that is loose or worn. Avoid having throw rugs at the top or bottom of the stairs. If you do have throw rugs, attach them to the floor with carpet tape. Make sure that you have a light switch at the top of the stairs and the bottom of the stairs. If you do not have them, ask someone to add them for you. What else can I do to help prevent falls? Wear shoes that: Do not have high heels. Have rubber bottoms. Are comfortable and fit you  well. Are closed at the toe. Do not wear sandals. If you use a stepladder: Make sure that it is fully opened. Do not climb a closed stepladder. Make sure that both sides of the stepladder are locked into place. Ask someone to hold it for you, if possible. Clearly mark and make sure that you can see: Any grab bars or handrails. First and last steps. Where the edge of each step is. Use tools that help you move around (mobility aids) if they are needed. These include: Canes. Walkers. Scooters. Crutches. Turn on the lights when you go into a dark area. Replace any light bulbs as soon as they burn out. Set up your furniture so you have a clear path. Avoid moving your furniture around. If any of your floors are uneven, fix them. If there are any pets around you, be aware of where they are. Review your medicines with your doctor. Some medicines can make you feel dizzy. This can increase your chance of falling. Ask your doctor what other things that you can do to help prevent falls. This information is not intended to replace advice given to you by your health care provider. Make sure you discuss any questions you have with your health care provider. Document Released: 01/11/2009 Document Revised: 08/23/2015 Document Reviewed: 04/21/2014 Elsevier Interactive Patient Education  2017 [employer].

## 2022-03-19 NOTE — Progress Notes (Signed)
I connected with  Gerald [name] on 03/19/22 by a audio enabled telemedicine application and verified that I am speaking with the correct person using two identifiers.  Patient Location: Home  Provider Location: Home Office  I discussed the limitations of evaluation and management by telemedicine. The patient expressed understanding and agreed to proceed.   Subjective:   Gerald [name] is a 76 y.o. male who presents for Medicare Annual/Subsequent preventive examination.  Review of Systems     Cardiac Risk Factors include: advanced age (>40mn, >>80women);dyslipidemia;male gender;hypertension     Objective:    Today's Vitals   03/19/22 1028  Weight: 184 lb (83.5 kg)   Body mass index is 28.82 kg/m.     03/19/2022   10:34 AM 10/25/2020    2:23 PM 04/28/2019    1:54 PM 04/20/2018    3:28 PM 10/27/2017    6:12 AM 09/14/2017    2:16 PM 04/13/2017   10:15 AM  Advanced Directives  Does Patient Have a Medical Advance Directive? Yes Yes Yes Yes Yes Yes Yes  Type of AParamedicof ACummingLiving will [place]Living will [place]Living will Living will;Healthcare Power of [place] will  Does patient want to make changes to medical advance directive? No - Patient declined  No - Patient declined  No - Patient declined No - Patient declined No - Patient declined  Copy of HHilbertin Chart? Yes - validated most recent copy scanned in chart (See row information) Yes - validated most recent copy scanned in chart (See row information) No - copy requested No - copy requested   No - copy requested    Current Medications (verified) Outpatient Encounter Medications as of 03/19/2022  Medication Sig   aspirin EC 81 MG tablet Take 1 tablet (81 mg total) by mouth daily.   Ciclopirox 1 % shampoo APP TO SCALP UTD AND PRN   famotidine (PEPCID) 20 MG tablet Take one tablet  daily as needed for breakthrough GERD.   Glycerin-Polysorbate 80 (REFRESH DRY EYE THERAPY OP) Apply to eye as needed.   ketoconazole (NIZORAL) 2 % shampoo Apply 1 application topically as needed for irritation. For eczema   losartan (COZAAR) 100 MG tablet TAKE ONE TABLET BY MOUTH ONE TIME DAILY   Methylcellulose, Laxative, (CITRUCEL PO) Take by mouth as directed.   metoprolol tartrate (LOPRESSOR) 25 MG tablet Take 1 tablet (25 mg total) by mouth 2 (two) times daily.   Multiple Vitamins-Minerals (MULTIVITAMIN ADULTS 50+ PO) Take 1 tablet by mouth daily.   Omega-3 Fatty Acids (FISH OIL) 1000 MG CPDR Take 3,000 mg by mouth 2 (two) times daily.   omeprazole (PRILOSEC OTC) 20 MG tablet Take 20 mg by mouth in the morning.   rosuvastatin (CRESTOR) 5 MG tablet TAKE ONE TABLET BY MOUTH ONE TIME DAILY   tadalafil (CIALIS) 20 MG tablet TAKE ONE-HALF TO ONE TABLET BY MOUTH ONE TIME EVERY OTHER DAY AS NEEDED FOR ERECTILE DYSFUNCTION   nitroGLYCERIN (NITROSTAT) 0.4 MG SL tablet Place 1 tablet (0.4 mg total) under the tongue every 5 (five) minutes as needed for chest pain.   No facility-administered encounter medications on file as of 03/19/2022.    Allergies (verified) Augmentin [amoxicillin-pot clavulanate], Tessalon [benzonatate], and Penicillins   History: Past Medical History:  Diagnosis Date   APC (atrial premature contractions) 10/18/2018   Back pain, chronic    Benign localized prostatic hyperplasia with lower  urinary tract symptoms (LUTS)    Bladder outlet obstruction    Bleeding from urethra in male 10/26/2017   3 times over 7 hours   Chest pain in adult 10/18/2018   Depression    Diverticulosis of colon    Elevated PSA    Fatty liver    GERD (gastroesophageal reflux disease)    Hiatal hernia    History of alcohol abuse    quit 1989   History of esophageal stricture    2003  S/P  DILATATION   History of esophagitis    History of melanoma in situ    06/ 2017  REMOVAL RIGHT CHEEK AREA    History of sepsis 06/06/2016   admitted for urosepsis , UTI due to pseudomonas-- resolved    HTN (hypertension) 09/01/2011   Hx of adenomatous colonic polyps    2003   TUBULAR ADENOMA   Hyperlipidemia    Hypertension    Hypertensive left ventricular hypertrophy, without heart failure 06/05/2015   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   Mild sleep apnea    2004 mild, per sleep study-- no cpap recommended  (pt denies)   Past Surgical History:  Procedure Laterality Date   CARDIOVASCULAR STRESS TEST  04-17-2015  dr Claiborne Billings   Normal stress nulcear study w/ a small, mild, fixed inferior defect consistent with inferior thinning; no ischemia; normal LV function and wall motion , stress ef 56%   COLONOSCOPY  last one 12-02-2006   CYSTOSCOPY WITH URETHRAL DILATATION N/A 10/27/2017   Procedure: CYSTOSCOPY WITH URETHRAL DILATATION;  Surgeon: Ceasar Mons, MD;  Location: [facility];  Service: Urology;  Laterality: N/A;  ONLY NEEDS 90 MIN FOR ALL PROCEDURES   EXCISION LEFT ARM MASS  1988   MELANOMA EXCISION     MOHS SURGERY  09/06/2015   right parotidemomasseteric excision w/ complex repair for melanoma in situ   THULIUM LASER TURP (TRANSURETHRAL RESECTION OF PROSTATE) N/A 07/29/2016   Procedure: Marcelino Duster LASER TURP (TRANSURETHRAL RESECTION OF PROSTATE)/ TRANSRECTAL ULTRASOUND GUIDED PROSTATE BIOPSY;  Surgeon: Nickie Retort, MD;  Location: [facility];  Service: Urology;  Laterality: N/A;   TONSILLECTOMY AND ADENOIDECTOMY  child   TRANSTHORACIC ECHOCARDIOGRAM  04/16/2015   moderate LVH,  ef 25-42%, grade 1 diastolic dysfunction/  trivial AR, MR and PR/  mild dilated ascending aorta/  mild TR   TRANSURETHRAL RESECTION OF PROSTATE N/A 10/27/2017   Procedure: TRANSURETHRAL RESECTION OF THE PROSTATE (TURP);  Surgeon: Ceasar Mons, MD;  Location: [facility];  Service: Urology;  Laterality: N/A;   UPPER GASTROINTESTINAL ENDOSCOPY  last one  02-15-2014   Family History  Problem Relation Age of Onset   Melanoma Mother    Uterine cancer Mother    Coronary artery disease Father    Colon cancer Father        ? unknown age dx/died at 22   Coronary artery disease Maternal Grandmother    Colon cancer Maternal Grandmother 74   Heart attack Maternal Grandfather    Coronary artery disease Paternal Grandmother    Heart attack Paternal Grandfather    Hypertension Maternal Aunt    Prostate cancer Neg Hx    Stroke Neg Hx    Esophageal cancer Neg Hx    Pancreatic cancer Neg Hx    Rectal cancer Neg Hx    Stomach cancer Neg Hx    Social History   Socioeconomic History   Marital status: Married    Spouse name: Not on  file   Number of children: 2   Years of education: Not on file   Highest education level: Not on file  Occupational History   Occupation: Retired  Tobacco Use   Smoking status: Former    Packs/day: 2.00    Years: 30.00    Total pack years: 60.00    Types: Cigarettes    Quit date: 08/29/2006    Years since quitting: 15.5    Passive exposure: Past   Smokeless tobacco: Never  Vaping Use   Vaping Use: Never used  Substance and Sexual Activity   Alcohol use: No   Drug use: No   Sexual activity: Yes  Other Topics Concern   Not on file  Social History Narrative   Diet: regular, lots of vegetables-----exercise : none but active   Caffeine use:  5 cups coffee daily         Social Determinants of Health   Financial Resource Strain: Low Risk  (03/19/2022)   Overall Financial Resource Strain (CARDIA)    Difficulty of Paying Living Expenses: Not hard at all  Food Insecurity: No Food Insecurity (03/19/2022)   Hunger Vital Sign    Worried About Running Out of Food in the Last Year: Never true    Ran Out of Food in the Last Year: Never true  Transportation Needs: No Transportation Needs (03/19/2022)   PRAPARE - Hydrologist (Medical): No    Lack of Transportation (Non-Medical): No   Physical Activity: Inactive (03/19/2022)   Exercise Vital Sign    Days of Exercise per Week: 0 days    Minutes of Exercise per Session: 0 min  Stress: No Stress Concern Present (03/19/2022)   [place]    Feeling of Stress : Not at all  Social Connections: Moderately Integrated (03/19/2022)   Social Connection and Isolation Panel [NHANES]    Frequency of Communication with Friends and Family: More than three times a week    Frequency of Social Gatherings with Friends and Family: More than three times a week    Attends Religious Services: More than 4 times per year    Active Member of [employer] or Organizations: No    Attends Music therapist: Never    Marital Status: Married    Tobacco Counseling Counseling given: Not Answered   Clinical Intake:  Pre-visit preparation completed: Yes  Pain : No/denies pain     BMI - recorded: 28.82 Nutritional Status: BMI 25 -29 Overweight Nutritional Risks: None Diabetes: No  How often do you need to have someone help you when you read instructions, pamphlets, or other written materials from your doctor or pharmacy?: 1 - Never  Diabetic?no  Interpreter Needed?: No  Information entered by :: Charlott Rakes, LPN   Activities of Daily Living    03/19/2022   10:35 AM  In your present state of health, do you have any difficulty performing the following activities:  Hearing? 0  Vision? 0  Difficulty concentrating or making decisions? 0  Walking or climbing stairs? 0  Dressing or bathing? 0  Doing errands, shopping? 0  Preparing Food and eating ? N  Using the Toilet? N  In the past six months, have you accidently leaked urine? N  Do you have problems with loss of bowel control? N  Managing your Medications? N  Managing your Finances? N  Housekeeping or managing your Housekeeping? N    Patient Care Team: Debbrah Alar,  NP as PCP - General  (Internal Medicine)  Indicate any recent Medical Services you may have received from other than [facility] providers in the past year (date may be approximate).     Assessment:   This is a routine wellness examination for Revin.  Hearing/Vision screen Hearing Screening - Comments:: Pt denies any hearing issues  Vision Screening - Comments:: Pt follows up with Dr Marica Otter   Dietary issues and exercise activities discussed: Current Exercise Habits: The patient does not participate in regular exercise at present   Goals Addressed             This Visit's Progress    Patient Stated       None at this time        Depression Screen    03/19/2022   10:33 AM 12/31/2021    3:43 PM 10/25/2020    2:24 PM 04/28/2019    1:58 PM 04/20/2018    3:29 PM 04/13/2017   10:15 AM 04/04/2016   11:31 AM  PHQ 2/9 Scores  PHQ - 2 Score 0 0 0 0 0 0 0    Fall Risk    03/19/2022   10:35 AM 12/31/2021    3:43 PM 10/25/2020    2:24 PM 04/28/2019    1:57 PM 04/20/2018    3:29 PM  Deport in the past year? 0  0 0 0  Number falls in past yr: 0 0 0 0   Injury with Fall? 0 0 0 0   Risk for fall due to : Impaired vision      Follow up Falls prevention discussed Falls evaluation completed Falls prevention discussed Education provided;Falls prevention discussed     FALL RISK PREVENTION PERTAINING TO THE HOME:  Any stairs in or around the home? No  If so, are there any without handrails? No  Home free of loose throw rugs in walkways, pet beds, electrical cords, etc? Yes  Adequate lighting in your home to reduce risk of falls? Yes   ASSISTIVE DEVICES UTILIZED TO PREVENT FALLS:  Life alert? No  Use of a cane, walker or w/c? No  Grab bars in the bathroom? Yes  Shower chair or bench in shower? Yes  Elevated toilet seat or a handicapped toilet? Yes   TIMED UP AND GO:  Was the test performed? No .  Cognitive Function:    04/13/2017   10:15 AM 04/04/2016   11:31 AM  MMSE - Mini Mental  State Exam  Orientation to time 5 5  Orientation to Place 5 5  Registration 3 3  Attention/ Calculation 5 5  Recall 3 2  Language- name 2 objects 2 2  Language- repeat 1 1  Language- follow 3 step command 3 3  Language- read & follow direction 1 1  Write a sentence 1 1  Copy design 1 1  Total score 30 29        03/19/2022   10:36 AM  6CIT Screen  What Year? 0 points  What month? 0 points  What time? 0 points  Count back from 20 0 points  Months in reverse 0 points  Repeat phrase 0 points  Total Score 0 points    Immunizations Immunization History  Administered Date(s) Administered   Fluad Quad(high Dose 65+) 12/20/2020   Influenza Split 12/30/2010, 01/06/2012, 02/03/2017   Influenza Whole 02/18/2007, 03/13/2009, 01/31/2010   Influenza, High Dose Seasonal PF 01/29/2017, 12/29/2017, 01/19/2018, 12/14/2018   Influenza-Unspecified 01/29/2013, 12/29/2013,  01/15/2016, 01/12/2022   PFIZER(Purple Top)SARS-COV-2 Vaccination 05/14/2019, 06/06/2019, 01/19/2020   Pfizer Covid-19 Vaccine Bivalent Booster 73yr & up 12/20/2020   Pneumococcal Conjugate-13 03/15/2014   Pneumococcal Polysaccharide-23 05/29/2005, 01/06/2012   Td 04/20/2001, 03/15/2014   Zoster, Live 08/27/2011    TDAP status: Up to date  Flu Vaccine status: Up to date  Pneumococcal vaccine status: Up to date  Covid-19 vaccine status: Completed vaccines  Qualifies for Shingles Vaccine? Yes   Zostavax completed No   Shingrix Completed?: No.    Education has been provided regarding the importance of this vaccine. Patient has been advised to call insurance company to determine out of pocket expense if they have not yet received this vaccine. Advised may also receive vaccine at local pharmacy or Health Dept. Verbalized acceptance and understanding.  Screening Tests Health Maintenance  Topic Date Due   COVID-19 Vaccine (5 - 2023-24 season) 11/29/2021   Zoster Vaccines- Shingrix (1 of 2) 06/18/2022 (Originally  09/15/1964)   Medicare Annual Wellness (AWV)  03/20/2023   DTaP/Tdap/Td (3 - Tdap) 03/15/2024   COLONOSCOPY (Pts 45-461yrInsurance coverage will need to be confirmed)  03/28/2027   Pneumonia Vaccine 75Years old  Completed   INFLUENZA VACCINE  Completed   Hepatitis C Screening  Completed   HPV VACCINES  Aged Out    Health Maintenance  Health Maintenance Due  Topic Date Due   COVID-19 Vaccine (5 - 2023-24 season) 11/29/2021    Colorectal cancer screening: Type of screening: Colonoscopy. Completed 03/27/17. Repeat every 10 years  Additional Screening:  Hepatitis C Screening:  Completed 03/19/15  Vision Screening: Recommended annual ophthalmology exams for early detection of glaucoma and other disorders of the eye. Is the patient up to date with their annual eye exam?  Yes  Who is the provider or what is the name of the office in which the patient attends annual eye exams? Dr SaMarica OtterIf pt is not established with a provider, would they like to be referred to a provider to establish care? No .   Dental Screening: Recommended annual dental exams for proper oral hygiene  Community Resource Referral / Chronic Care Management: CRR required this visit?  No   CCM required this visit?  No      Plan:     I have personally reviewed and noted the following in the patient's chart:   Medical and social history Use of alcohol, tobacco or illicit drugs  Current medications and supplements including opioid prescriptions. Patient is not currently taking opioid prescriptions. Functional ability and status Nutritional status Physical activity Advanced directives List of other physicians Hospitalizations, surgeries, and ER visits in previous 12 months Vitals Screenings to include cognitive, depression, and falls Referrals and appointments  In addition, I have reviewed and discussed with patient certain preventive protocols, quality metrics, and best practice recommendations. A  written personalized care plan for preventive services as well as general preventive health recommendations were provided to patient.     TiWillette BraceLPN   [id number] Nurse Notes: none

## 2022-04-02 DIAGNOSIS — L821 Other seborrheic keratosis: Secondary | ICD-10-CM | POA: Diagnosis not present

## 2022-04-02 DIAGNOSIS — D225 Melanocytic nevi of trunk: Secondary | ICD-10-CM | POA: Diagnosis not present

## 2022-04-02 DIAGNOSIS — Z85820 Personal history of malignant melanoma of skin: Secondary | ICD-10-CM | POA: Diagnosis not present

## 2022-04-02 DIAGNOSIS — L814 Other melanin hyperpigmentation: Secondary | ICD-10-CM | POA: Diagnosis not present

## 2022-04-02 DIAGNOSIS — L720 Epidermal cyst: Secondary | ICD-10-CM | POA: Diagnosis not present

## 2022-04-02 DIAGNOSIS — L218 Other seborrheic dermatitis: Secondary | ICD-10-CM | POA: Diagnosis not present

## 2022-04-02 DIAGNOSIS — L918 Other hypertrophic disorders of the skin: Secondary | ICD-10-CM | POA: Diagnosis not present

## 2022-04-02 DIAGNOSIS — D1801 Hemangioma of skin and subcutaneous tissue: Secondary | ICD-10-CM | POA: Diagnosis not present

## 2022-04-03 NOTE — Progress Notes (Signed)
Gerald [name] D.[place] Phone: [phone]   Assessment and Plan:    1. Acute pain of left shoulder - Acute, initial visit - Most consistent with subacromial bursitis versus rotator cuff tendinopathy based on physical exam and no significant MOI - Start HEP and physical therapy for shoulder and rotator cuff - Start Tylenol 500 to 1000 mg tablets 2-3 times a day for day-to-day pain relief - Patient elected for subacromial CSI.  Tolerated well per note below  Procedure: Subacromial Injection Side: Left  Risks explained and consent was given verbally. The site was cleaned with alcohol prep. A steroid injection was performed from posterior approach using 23m of 1% lidocaine without epinephrine and 169mof kenalog '40mg'$ /ml. This was well tolerated and resulted in symptomatic relief.  Needle was removed, hemostasis achieved, and post injection instructions were explained.   Pt was advised to call or return to clinic if these symptoms worsen or fail to improve as anticipated.    Pertinent previous records reviewed include none   Follow Up: 3 to 4 weeks for reevaluation.  If no improvement or worsening of symptoms, would obtain x-ray of left shoulder and could consider NSAID course versus intra-articular CSI.  Could also consider treatment for chronic bilateral thumb pain and obtain hand x-rays   Subjective:   I, Gerald [name], am serving as a scEducation administratoror Doctor BeGlennon MacChief Complaint: left arm pain   HPI:   04/04/2022 Patient is a [age]ear old male complaining of left arm pain. Patient states he gets his arm caught in his under shirt and when he pulls it out he has tightness, he has bicep pain hx of this happening before and Pt really helped would like another one, aleve helps with the pain , no pain now , decrease ROM due to pain no radiating pain    Relevant Historical Information: GERD,  hypertension  Additional pertinent review of systems negative.   Current Outpatient Medications:    aspirin EC 81 MG tablet, Take 1 tablet (81 mg total) by mouth daily., Disp: 90 tablet, Rfl: 3   Ciclopirox 1 % shampoo, APP TO SCALP UTD AND PRN, Disp: , Rfl: 2   famotidine (PEPCID) 20 MG tablet, Take one tablet daily as needed for breakthrough GERD., Disp: , Rfl:    Glycerin-Polysorbate 80 (REFRESH DRY EYE THERAPY OP), Apply to eye as needed., Disp: , Rfl:    ketoconazole (NIZORAL) 2 % shampoo, Apply 1 application topically as needed for irritation. For eczema, Disp: , Rfl:    losartan (COZAAR) 100 MG tablet, TAKE ONE TABLET BY MOUTH ONE TIME DAILY, Disp: 90 tablet, Rfl: 1   Methylcellulose, Laxative, (CITRUCEL PO), Take by mouth as directed., Disp: , Rfl:    metoprolol tartrate (LOPRESSOR) 25 MG tablet, Take 1 tablet (25 mg total) by mouth 2 (two) times daily., Disp: 180 tablet, Rfl: 1   Multiple Vitamins-Minerals (MULTIVITAMIN ADULTS 50+ PO), Take 1 tablet by mouth daily., Disp: , Rfl:    Omega-3 Fatty Acids (FISH OIL) 1000 MG CPDR, Take 3,000 mg by mouth 2 (two) times daily., Disp: , Rfl:    omeprazole (PRILOSEC OTC) 20 MG tablet, Take 20 mg by mouth in the morning., Disp: , Rfl:    rosuvastatin (CRESTOR) 5 MG tablet, TAKE ONE TABLET BY MOUTH ONE TIME DAILY, Disp: 90 tablet, Rfl: 1   tadalafil (CIALIS) 20 MG tablet, TAKE ONE-HALF TO ONE TABLET BY MOUTH  ONE TIME EVERY OTHER DAY AS NEEDED FOR ERECTILE DYSFUNCTION, Disp: 10 tablet, Rfl: 11   nitroGLYCERIN (NITROSTAT) 0.4 MG SL tablet, Place 1 tablet (0.4 mg total) under the tongue every 5 (five) minutes as needed for chest pain., Disp: 100 tablet, Rfl: 2   Objective:     Vitals:   04/04/22 1330  Pulse: (!) 59  SpO2: 97%  Weight: 194 lb (88 kg)  Height: '5\' 7"'$  (1.702 m)      Body mass index is 30.38 kg/m.    Physical Exam:    Gen: Appears well, nad, nontoxic and pleasant Neuro:sensation intact, strength is 5/5 with df/pf/inv/ev,  muscle tone wnl Skin: no suspicious lesion or defmority Psych: A&O, appropriate mood and affect  Left shoulder: no deformity, swelling or muscle wasting No scapular winging FF 160, abd 150, int 20, ext 80 NTTP over the [place], clavicle, ac, coracoid, biceps groove, humerus, deltoid, trapezius, cervical spine Positive Neer's, Hawking's, empty can, O'Brien, subscap liftoff, Speed   Neg ant drawer, sulcus sign, apprehension Negative Spurling's test bilat FROM of neck    Electronically signed by:  Gerald [name] D.[facility] 1:50 PM 04/04/22

## 2022-04-04 ENCOUNTER — Ambulatory Visit (INDEPENDENT_AMBULATORY_CARE_PROVIDER_SITE_OTHER): Payer: Medicare Other | Admitting: Sports Medicine

## 2022-04-04 VITALS — HR 59 | Ht 67.0 in | Wt 194.0 lb

## 2022-04-04 DIAGNOSIS — M25512 Pain in left shoulder: Secondary | ICD-10-CM | POA: Diagnosis not present

## 2022-04-04 NOTE — Patient Instructions (Addendum)
Shoulder HEP  Pt referral  Recommend warm hand baths  Tylenol [phone] mg 2-3 times a day for pain relief  3-4 week follow up

## 2022-04-22 DIAGNOSIS — L720 Epidermal cyst: Secondary | ICD-10-CM | POA: Diagnosis not present

## 2022-04-23 NOTE — Progress Notes (Unsigned)
Gerald [name] Gerald [name] Phone: [phone]   Assessment and Plan:     There are no diagnoses linked to this encounter.  ***   Pertinent previous records reviewed include ***   Follow Up: ***     Subjective:   I, Gerald [name], am serving as a Education administrator for Doctor Glennon Mac   Chief Complaint: left arm pain    HPI:    04/04/2022 Patient is a 77 year old male complaining of left arm pain. Patient states he gets his arm caught in his under shirt and when he pulls it out he has tightness, he has bicep pain hx of this happening before and Pt really helped would like another one, aleve helps with the pain , no pain now , decrease ROM due to pain no radiating pain    04/24/2022 Patient states    Relevant Historical Information: GERD, hypertension  Additional pertinent review of systems negative.   Current Outpatient Medications:    aspirin EC 81 MG tablet, Take 1 tablet (81 mg total) by mouth daily., Disp: 90 tablet, Rfl: 3   Ciclopirox 1 % shampoo, APP TO SCALP UTD AND PRN, Disp: , Rfl: 2   famotidine (PEPCID) 20 MG tablet, Take one tablet daily as needed for breakthrough GERD., Disp: , Rfl:    Glycerin-Polysorbate 80 (REFRESH DRY EYE THERAPY OP), Apply to eye as needed., Disp: , Rfl:    ketoconazole (NIZORAL) 2 % shampoo, Apply 1 application topically as needed for irritation. For eczema, Disp: , Rfl:    losartan (COZAAR) 100 MG tablet, TAKE ONE TABLET BY MOUTH ONE TIME DAILY, Disp: 90 tablet, Rfl: 1   Methylcellulose, Laxative, (CITRUCEL PO), Take by mouth as directed., Disp: , Rfl:    metoprolol tartrate (LOPRESSOR) 25 MG tablet, Take 1 tablet (25 mg total) by mouth 2 (two) times daily., Disp: 180 tablet, Rfl: 1   Multiple Vitamins-Minerals (MULTIVITAMIN ADULTS 50+ PO), Take 1 tablet by mouth daily., Disp: , Rfl:    nitroGLYCERIN (NITROSTAT) 0.4 MG SL tablet, Place 1 tablet (0.4 mg total) under  the tongue every 5 (five) minutes as needed for chest pain., Disp: 100 tablet, Rfl: 2   Omega-3 Fatty Acids (FISH OIL) 1000 MG CPDR, Take 3,000 mg by mouth 2 (two) times daily., Disp: , Rfl:    omeprazole (PRILOSEC OTC) 20 MG tablet, Take 20 mg by mouth in the morning., Disp: , Rfl:    rosuvastatin (CRESTOR) 5 MG tablet, TAKE ONE TABLET BY MOUTH ONE TIME DAILY, Disp: 90 tablet, Rfl: 1   tadalafil (CIALIS) 20 MG tablet, TAKE ONE-HALF TO ONE TABLET BY MOUTH ONE TIME EVERY OTHER DAY AS NEEDED FOR ERECTILE DYSFUNCTION, Disp: 10 tablet, Rfl: 11   Objective:     There were no vitals filed for this visit.    There is no height or weight on file to calculate BMI.    Physical Exam:    ***   Electronically signed by:  Gerald [name] D.[facility] 11:55 AM 04/23/22

## 2022-04-24 ENCOUNTER — Ambulatory Visit (INDEPENDENT_AMBULATORY_CARE_PROVIDER_SITE_OTHER): Payer: Medicare Other | Admitting: Sports Medicine

## 2022-04-24 VITALS — BP 120/80 | HR 68 | Ht 67.0 in | Wt 191.0 lb

## 2022-04-24 DIAGNOSIS — M25512 Pain in left shoulder: Secondary | ICD-10-CM | POA: Diagnosis not present

## 2022-04-24 NOTE — Patient Instructions (Addendum)
Good to see you Thumb pain recommend Tylenol [phone] mg 2-3 times a day for pain relief  Warm hand baths  Hand HEP  As needed follow up

## 2022-04-27 ENCOUNTER — Encounter: Payer: Self-pay | Admitting: Family

## 2022-05-01 DIAGNOSIS — M79671 Pain in right foot: Secondary | ICD-10-CM | POA: Diagnosis not present

## 2022-05-01 DIAGNOSIS — M79672 Pain in left foot: Secondary | ICD-10-CM | POA: Diagnosis not present

## 2022-05-01 DIAGNOSIS — B351 Tinea unguium: Secondary | ICD-10-CM | POA: Diagnosis not present

## 2022-05-05 ENCOUNTER — Ambulatory Visit: Payer: Medicare Other | Admitting: Physical Therapy

## 2022-05-08 ENCOUNTER — Ambulatory Visit: Payer: Medicare Other | Admitting: Physical Therapy

## 2022-05-09 DIAGNOSIS — H2511 Age-related nuclear cataract, right eye: Secondary | ICD-10-CM | POA: Diagnosis not present

## 2022-05-09 DIAGNOSIS — H2512 Age-related nuclear cataract, left eye: Secondary | ICD-10-CM | POA: Diagnosis not present

## 2022-05-19 ENCOUNTER — Encounter: Payer: Medicare Other | Admitting: Physical Therapy

## 2022-05-19 ENCOUNTER — Encounter: Payer: Self-pay | Admitting: Family

## 2022-05-20 ENCOUNTER — Other Ambulatory Visit: Payer: Self-pay | Admitting: Family

## 2022-05-20 MED ORDER — PAXLOVID (300/100) 20 X 150 MG & 10 X 100MG PO TBPK: ORAL_TABLET | ORAL | 0 refills | Status: DC

## 2022-05-26 ENCOUNTER — Encounter: Payer: Medicare Other | Admitting: Physical Therapy

## 2022-05-28 DIAGNOSIS — H269 Unspecified cataract: Secondary | ICD-10-CM | POA: Diagnosis not present

## 2022-05-28 DIAGNOSIS — H2511 Age-related nuclear cataract, right eye: Secondary | ICD-10-CM | POA: Diagnosis not present

## 2022-06-11 DIAGNOSIS — H269 Unspecified cataract: Secondary | ICD-10-CM | POA: Diagnosis not present

## 2022-06-11 DIAGNOSIS — H2512 Age-related nuclear cataract, left eye: Secondary | ICD-10-CM | POA: Diagnosis not present

## 2022-07-07 NOTE — Progress Notes (Signed)
Subjective:   By signing my name below, I, Isabelle Course, attest that this documentation has been prepared under the direction and in the presence of Lemont Fillers, NP 07/08/22   Patient ID: Gerald [name], male    DOB: 06-Mar-1946, 77 y.o.   MRN: [id number]  Chief Complaint  Patient presents with   Hypertension    Here for follow up   Ear Fullness    Complains of ear fullness since cleaning with q-tip Saturday.     HPI Patient is in today for a 6 month follow up.   Hypertension: His blood pressure is well controlled.  BP Readings from Last 3 Encounters:  07/08/22 121/65  04/24/22 120/80  12/31/21 123/64   Acid Reflux: He has no new complaints or concerns. He is compliant with 20 mg Omeprazole every morning.   Elevated PSA: Following up with his urologist annually to monitor his PSA values.  Lab Results  Component Value Date   PSA 6.28 (H) 09/01/2011   PSA 5.14 (H) 05/14/2011   PSA 4.32 (H) 10/23/2010   Ear fullness: He complains of ear fullness in his left ear after cleaning it with a Q-tip on Saturday. He reports blood being on the Q-tip.   Past Medical History:  Diagnosis Date   APC (atrial premature contractions) 10/18/2018   Back pain, chronic    Benign localized prostatic hyperplasia with lower urinary tract symptoms (LUTS)    Bladder outlet obstruction    Bleeding from urethra in male 10/26/2017   3 times over 7 hours   Chest pain in adult 10/18/2018   Depression    Diverticulosis of colon    Elevated PSA    Fatty liver    GERD (gastroesophageal reflux disease)    Hiatal hernia    History of alcohol abuse    quit 1989   History of esophageal stricture    2003  S/P  DILATATION   History of esophagitis    History of melanoma in situ    06/ 2017  REMOVAL RIGHT CHEEK AREA   History of sepsis 06/06/2016   admitted for urosepsis , UTI due to pseudomonas-- resolved    HTN (hypertension) 09/01/2011   Hx of adenomatous colonic polyps    2003    TUBULAR ADENOMA   Hyperlipidemia    Hypertension    Hypertensive left ventricular hypertrophy, without heart failure 06/05/2015   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   Mild sleep apnea    2004 mild, per sleep study-- no cpap recommended  (pt denies)    Past Surgical History:  Procedure Laterality Date   CARDIOVASCULAR STRESS TEST  04-17-2015  dr Tresa Endo   Normal stress nulcear study w/ a small, mild, fixed inferior defect consistent with inferior thinning; no ischemia; normal LV function and wall motion , stress ef 56%   COLONOSCOPY  last one 12-02-2006   CYSTOSCOPY WITH URETHRAL DILATATION N/A 10/27/2017   Procedure: CYSTOSCOPY WITH URETHRAL DILATATION;  Surgeon: Rene Paci, MD;  Location: [facility];  Service: Urology;  Laterality: N/A;  ONLY NEEDS 90 MIN FOR ALL PROCEDURES   EXCISION LEFT ARM MASS  1988   MELANOMA EXCISION     MOHS SURGERY  09/06/2015   right parotidemomasseteric excision w/ complex repair for melanoma in situ   THULIUM LASER TURP (TRANSURETHRAL RESECTION OF PROSTATE) N/A 07/29/2016   Procedure: Morton Peters LASER TURP (TRANSURETHRAL RESECTION OF PROSTATE)/ TRANSRECTAL ULTRASOUND GUIDED PROSTATE BIOPSY;  Surgeon: Hildred Laser, MD;  Location: [facility];  Service: Urology;  Laterality: N/A;   TONSILLECTOMY AND ADENOIDECTOMY  child   TRANSTHORACIC ECHOCARDIOGRAM  04/16/2015   moderate LVH,  ef 60-65%, grade 1 diastolic dysfunction/  trivial AR, MR and PR/  mild dilated ascending aorta/  mild TR   TRANSURETHRAL RESECTION OF PROSTATE N/A 10/27/2017   Procedure: TRANSURETHRAL RESECTION OF THE PROSTATE (TURP);  Surgeon: Rene Paci, MD;  Location: [facility];  Service: Urology;  Laterality: N/A;   UPPER GASTROINTESTINAL ENDOSCOPY  last one 02-15-2014    Family History  Problem Relation Age of Onset   Melanoma Mother    Uterine cancer Mother    Coronary artery disease Father    Colon cancer Father         ? unknown age dx/died at 82   Coronary artery disease Maternal Grandmother    Colon cancer Maternal Grandmother 74   Heart attack Maternal Grandfather    Coronary artery disease Paternal Grandmother    Heart attack Paternal Grandfather    Hypertension Maternal Aunt    Prostate cancer Neg Hx    Stroke Neg Hx    Esophageal cancer Neg Hx    Pancreatic cancer Neg Hx    Rectal cancer Neg Hx    Stomach cancer Neg Hx     Social History   Socioeconomic History   Marital status: Married    Spouse name: Not on file   Number of children: 2   Years of education: Not on file   Highest education level: Bachelor's degree (e.g., BA, AB, BS)  Occupational History   Occupation: Retired  Tobacco Use   Smoking status: Former    Packs/day: 2.00    Years: 30.00    Additional pack years: 0.00    Total pack years: 60.00    Types: Cigarettes    Quit date: 08/29/2006    Years since quitting: 15.8    Passive exposure: Past   Smokeless tobacco: Never  Vaping Use   Vaping Use: Never used  Substance and Sexual Activity   Alcohol use: No   Drug use: No   Sexual activity: Yes  Other Topics Concern   Not on file  Social History Narrative   Diet: regular, lots of vegetables-----exercise : none but active   Caffeine use:  5 cups coffee daily         Social Determinants of Health   Financial Resource Strain: Low Risk  (03/19/2022)   Overall Financial Resource Strain (CARDIA)    Difficulty of Paying Living Expenses: Not hard at all  Food Insecurity: No Food Insecurity (07/06/2022)   Hunger Vital Sign    Worried About Running Out of Food in the Last Year: Never true    Ran Out of Food in the Last Year: Never true  Transportation Needs: No Transportation Needs (07/06/2022)   PRAPARE - Administrator, Civil Service (Medical): No    Lack of Transportation (Non-Medical): No  Physical Activity: Inactive (07/06/2022)   Exercise Vital Sign    Days of Exercise per Week: 0 days    Minutes of  Exercise per Session: 0 min  Stress: No Stress Concern Present (07/06/2022)   [employer] of Occupational Health - Occupational Stress Questionnaire    Feeling of Stress : Not at all  Social Connections: Unknown (07/06/2022)   Social Connection and Isolation Panel [NHANES]    Frequency of Communication with Friends and Family: Patient declined    Frequency of Social Gatherings with  Friends and Family: Once a week    Attends Religious Services: More than 4 times per year    Active Member of Clubs or Organizations: Yes    Attends Engineer, structural: More than 4 times per year    Marital Status: Married  Catering manager Violence: Not At Risk (03/19/2022)   Humiliation, Afraid, Rape, and Kick questionnaire    Fear of Current or Ex-Partner: No    Emotionally Abused: No    Physically Abused: No    Sexually Abused: No    Outpatient Medications Prior to Visit  Medication Sig Dispense Refill   aspirin EC 81 MG tablet Take 1 tablet (81 mg total) by mouth daily. 90 tablet 3   Ciclopirox 1 % shampoo APP TO SCALP UTD AND PRN  2   famotidine (PEPCID) 20 MG tablet Take one tablet daily as needed for breakthrough GERD.     Glycerin-Polysorbate 80 (REFRESH DRY EYE THERAPY OP) Apply to eye as needed.     ketoconazole (NIZORAL) 2 % shampoo Apply 1 application topically as needed for irritation. For eczema     losartan (COZAAR) 100 MG tablet TAKE ONE TABLET BY MOUTH ONE TIME DAILY 90 tablet 1   Methylcellulose, Laxative, (CITRUCEL PO) Take by mouth as directed.     metoprolol tartrate (LOPRESSOR) 25 MG tablet Take 1 tablet (25 mg total) by mouth 2 (two) times daily. 180 tablet 1   Multiple Vitamins-Minerals (MULTIVITAMIN ADULTS 50+ PO) Take 1 tablet by mouth daily.     nirmatrelvir & ritonavir (PAXLOVID, 300/100,) 20 x 150 MG & 10 x 100MG  TBPK Take per package instructions 30 tablet 0   Omega-3 Fatty Acids (FISH OIL) 1000 MG CPDR Take 3,000 mg by mouth 2 (two) times daily.     omeprazole  (PRILOSEC OTC) 20 MG tablet Take 20 mg by mouth in the morning.     rosuvastatin (CRESTOR) 5 MG tablet TAKE ONE TABLET BY MOUTH ONE TIME DAILY 90 tablet 1   tadalafil (CIALIS) 20 MG tablet TAKE ONE-HALF TO ONE TABLET BY MOUTH ONE TIME EVERY OTHER DAY AS NEEDED FOR ERECTILE DYSFUNCTION 10 tablet 11   nitroGLYCERIN (NITROSTAT) 0.4 MG SL tablet Place 1 tablet (0.4 mg total) under the tongue every 5 (five) minutes as needed for chest pain. 100 tablet 2   No facility-administered medications prior to visit.    Allergies  Allergen Reactions   Augmentin [Amoxicillin-Pot Clavulanate] Diarrhea    *Can take plain Amoxicillin* Has patient had a PCN reaction causing immediate rash, facial/tongue/throat swelling, SOB or lightheadedness with hypotension: No Has patient had a PCN reaction causing severe rash involving mucus membranes or skin necrosis: No Has patient had a PCN reaction that required hospitalization No Has patient had a PCN reaction occurring within the last 10 years: Yes If all of the above answers are "NO", then may proceed with Cephalosporin use.    Tessalon [Benzonatate]     Deep uncontrollable cough   Penicillins Rash    Childhood reaction *Can take Amoxicillin* Has patient had a PCN reaction causing immediate rash, facial/tongue/throat swelling, SOB or lightheadedness with hypotension: Unknown Has patient had a PCN reaction causing severe rash involving mucus membranes or skin necrosis: Unknown Has patient had a PCN reaction that required hospitalization: Unknown Has patient had a PCN reaction occurring within the last 10 years: Unknown  If all of the above answers are "NO", then may proceed with Cephalosporin use    ROS See HPI    Objective:  Physical Exam Constitutional:      General: He is not in acute distress.    Appearance: He is well-developed.  HENT:     Head: Normocephalic and atraumatic.     Left Ear: Tympanic membrane normal.     Ears:     Comments: Left  TM intact. Dry blood in ear canal. Cardiovascular:     Rate and Rhythm: Normal rate and regular rhythm.     Heart sounds: No murmur heard. Pulmonary:     Effort: Pulmonary effort is normal. No respiratory distress.     Breath sounds: Normal breath sounds. No wheezing or rales.  Skin:    General: Skin is warm and dry.  Neurological:     Mental Status: He is alert and oriented to person, place, and time.  Psychiatric:        Behavior: Behavior normal.        Thought Content: Thought content normal.     BP 121/65 (BP Location: Right Arm, Patient Position: Sitting, Cuff Size: Large)   Pulse 62   Temp 98.2 F (36.8 C) (Oral)   Resp 16   Wt 193 lb (87.5 kg)   SpO2 97%   BMI 30.23 kg/m  Wt Readings from Last 3 Encounters:  07/08/22 193 lb (87.5 kg)  04/24/22 191 lb (86.6 kg)  04/04/22 194 lb (88 kg)       Assessment & Plan:  Primary hypertension Assessment & Plan: BP Readings from Last 3 Encounters:  07/08/22 121/65  04/24/22 120/80  12/31/21 123/64   BP stable on losartan 100mg , and metoprolol. Continue same.   Orders: -     Basic metabolic panel  Mixed hyperlipidemia Assessment & Plan: Lab Results  Component Value Date   CHOL 154 12/31/2021   HDL 36.90 (L) 12/31/2021   LDLCALC 65 04/24/2020   LDLDIRECT 82.0 12/31/2021   TRIG 343.0 (H) 12/31/2021   CHOLHDL 4 12/31/2021   LDL OK, continue crestor.  Trigs were elevated last visit. I have advised him to plan to come fasting next visit for recheck.    History of melanoma in situ Assessment & Plan: Continues surveillance with Dermatology, Dr. Arta Bruce and [facility].    Gastroesophageal reflux disease with esophagitis, unspecified whether hemorrhage Assessment & Plan: Reports stable on omeprazole.    Elevated PSA Assessment & Plan: This is being monitored annually by Urology (Dr. Sande Brothers).    Depression, unspecified depression type Assessment & Plan: Stable mood- not on  medication.   Abrasion of left ear canal, initial encounter Assessment & Plan: New. Advised pt not to use Q tops in his ears.   Erectile dysfunction, unspecified erectile dysfunction type Assessment & Plan: Reports better response to Cialis than Viagra.  Requesting Cialis refill.   Other orders -     Tadalafil; TAKE ONE-HALF TO ONE TABLET BY MOUTH ONE TIME EVERY OTHER DAY AS NEEDED FOR ERECTILE DYSFUNCTION  Dispense: 10 tablet; Refill: 11     I,Rachel Rivera,acting as a Neurosurgeon for [employer], NP.,have documented all relevant documentation on the behalf of Lemont Fillers, NP,as directed by  Lemont Fillers, NP while in the presence of Lemont Fillers, NP.   I, Lemont Fillers, NP, personally preformed the services described in this documentation.  All medical record entries made by the scribe were at my direction and in my presence.  I have reviewed the chart and discharge instructions (if applicable) and agree that the record reflects my personal performance and  is accurate and complete. 07/08/22   Lemont Fillers, NP

## 2022-07-08 ENCOUNTER — Ambulatory Visit (INDEPENDENT_AMBULATORY_CARE_PROVIDER_SITE_OTHER): Payer: Medicare Other | Admitting: Family

## 2022-07-08 VITALS — BP 121/65 | HR 62 | Temp 98.2°F | Resp 16 | Wt 193.0 lb

## 2022-07-08 DIAGNOSIS — R972 Elevated prostate specific antigen [PSA]: Secondary | ICD-10-CM | POA: Diagnosis not present

## 2022-07-08 DIAGNOSIS — N529 Male erectile dysfunction, unspecified: Secondary | ICD-10-CM

## 2022-07-08 DIAGNOSIS — F32A Depression, unspecified: Secondary | ICD-10-CM

## 2022-07-08 DIAGNOSIS — E782 Mixed hyperlipidemia: Secondary | ICD-10-CM | POA: Diagnosis not present

## 2022-07-08 DIAGNOSIS — K2100 Gastro-esophageal reflux disease with esophagitis, without bleeding: Secondary | ICD-10-CM

## 2022-07-08 DIAGNOSIS — S00412A Abrasion of left ear, initial encounter: Secondary | ICD-10-CM | POA: Insufficient documentation

## 2022-07-08 DIAGNOSIS — I10 Essential (primary) hypertension: Secondary | ICD-10-CM

## 2022-07-08 DIAGNOSIS — Z86006 Personal history of melanoma in-situ: Secondary | ICD-10-CM

## 2022-07-08 HISTORY — DX: Male erectile dysfunction, unspecified: N52.9

## 2022-07-08 HISTORY — DX: Abrasion of left ear, initial encounter: S00.412A

## 2022-07-08 MED ORDER — TADALAFIL 20 MG PO TABS: ORAL_TABLET | ORAL | 11 refills | Status: DC

## 2022-07-08 NOTE — Assessment & Plan Note (Signed)
New. Advised pt not to use Q tops in his ears.

## 2022-07-08 NOTE — Assessment & Plan Note (Signed)
This is being monitored annually by Urology (Dr. Sande Brothers).

## 2022-07-08 NOTE — Assessment & Plan Note (Signed)
>>  ASSESSMENT AND PLAN FOR HISTORY OF MELANOMA IN SITU WRITTEN ON 07/08/2022  3:20 PM BY Sandford Craze, NP  Continues surveillance with Dermatology, Dr. Arta Bruce and [facility].

## 2022-07-08 NOTE — Assessment & Plan Note (Signed)
Reports stable on omeprazole.

## 2022-07-08 NOTE — Assessment & Plan Note (Signed)
Stable mood- not on medication.

## 2022-07-08 NOTE — Assessment & Plan Note (Addendum)
BP Readings from Last 3 Encounters:  07/08/22 121/65  04/24/22 120/80  12/31/21 123/64   BP stable on losartan 100mg , and metoprolol. Continue same.

## 2022-07-08 NOTE — Assessment & Plan Note (Addendum)
Continues surveillance with Dermatology, Dr. Arta Bruce and [facility].

## 2022-07-08 NOTE — Assessment & Plan Note (Addendum)
Lab Results  Component Value Date   CHOL 154 12/31/2021   HDL 36.90 (L) 12/31/2021   LDLCALC 65 04/24/2020   LDLDIRECT 82.0 12/31/2021   TRIG 343.0 (H) 12/31/2021   CHOLHDL 4 12/31/2021   LDL OK, continue crestor.  Trigs were elevated last visit. I have advised him to plan to come fasting next visit for recheck.

## 2022-07-08 NOTE — Assessment & Plan Note (Signed)
Reports better response to Cialis than Viagra.  Requesting Cialis refill.

## 2022-07-09 LAB — BASIC METABOLIC PANEL
BUN: 27 mg/dL — ABNORMAL HIGH (ref 6–23)
CO2: 27 mEq/L (ref 19–32)
Calcium: 9.4 mg/dL (ref 8.4–10.5)
Chloride: 105 mEq/L (ref 96–112)
Creatinine, Ser: 1.16 mg/dL (ref 0.40–1.50)
GFR: 61.48 mL/min (ref >60.00)
Glucose, Bld: 91 mg/dL (ref 70–99)
Potassium: 4.3 mEq/L (ref 3.5–5.1)
Sodium: 142 mEq/L (ref 135–145)

## 2022-07-11 ENCOUNTER — Encounter: Payer: Self-pay | Admitting: Family

## 2022-07-15 ENCOUNTER — Other Ambulatory Visit: Payer: Self-pay | Admitting: Family

## 2022-07-24 DIAGNOSIS — B351 Tinea unguium: Secondary | ICD-10-CM | POA: Diagnosis not present

## 2022-07-24 DIAGNOSIS — M79671 Pain in right foot: Secondary | ICD-10-CM | POA: Diagnosis not present

## 2022-07-24 DIAGNOSIS — L84 Corns and callosities: Secondary | ICD-10-CM | POA: Diagnosis not present

## 2022-07-24 DIAGNOSIS — M79672 Pain in left foot: Secondary | ICD-10-CM | POA: Diagnosis not present

## 2022-08-07 NOTE — Progress Notes (Signed)
Gerald [name] D.[facility] [address] [place] [address] Phone: [phone]   Assessment and Plan:     1. Acute right-sided low back pain without sciatica -Acute, uncomplicated, initial sports medicine visit - Most consistent with lumbar dysfunction, right-sided.  Based on patient age, I suspect facet arthropathy and degenerative changes may be contributing - Start meloxicam 15 mg daily x2 weeks.  If still having pain after 2 weeks, complete 3rd-week of meloxicam. May use remaining meloxicam as needed once daily for pain control.  Do not to use additional NSAIDs while taking meloxicam.  May use Tylenol [phone] mg 2 to 3 times a day for breakthrough pain. -Start HEP for low back    Pertinent previous records reviewed include none   Follow Up: 3 weeks for reevaluation.  Would obtain lumbar x-ray if no improvement or worsening of symptoms.  Could consider physical therapy versus advanced imaging versus discussing OMT   Subjective:   I, Gerald [name], am serving as a Neurosurgeon for Doctor Richardean Sale  Chief Complaint: back pain   HPI:   08/08/2022 Patient is a 77 year old male complaining of back pain. 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 Right sided low back pain that is throbbing , tylenol and ibu for the pain and that works, advil works the best , no numbness or tingling , pain radiates up the back some , he had pain when laying down   Relevant Historical Information: Hypertension, GERD   Additional pertinent review of systems negative.   Current Outpatient Medications:    aspirin EC 81 MG tablet, Take 1 tablet (81 mg total) by mouth daily., Disp: 90 tablet, Rfl: 3   Ciclopirox 1 % shampoo, APP TO SCALP UTD AND PRN, Disp: , Rfl: 2   famotidine (PEPCID) 20 MG tablet, Take one tablet daily as needed for breakthrough GERD., Disp: , Rfl:    Glycerin-Polysorbate 80  (REFRESH DRY EYE THERAPY OP), Apply to eye as needed., Disp: , Rfl:    ketoconazole (NIZORAL) 2 % shampoo, Apply 1 application topically as needed for irritation. For eczema, Disp: , Rfl:    losartan (COZAAR) 100 MG tablet, Take 1 tablet (100 mg total) by mouth daily., Disp: 90 tablet, Rfl: 1   meloxicam (MOBIC) 15 MG tablet, Take 1 tablet (15 mg total) by mouth daily., Disp: 30 tablet, Rfl: 0   Methylcellulose, Laxative, (CITRUCEL PO), Take by mouth as directed., Disp: , Rfl:    metoprolol tartrate (LOPRESSOR) 25 MG tablet, Take 1 tablet (25 mg total) by mouth 2 (two) times daily., Disp: 180 tablet, Rfl: 1   Multiple Vitamins-Minerals (MULTIVITAMIN ADULTS 50+ PO), Take 1 tablet by mouth daily., Disp: , Rfl:    nirmatrelvir & ritonavir (PAXLOVID, 300/100,) 20 x 150 MG & 10 x 100MG  TBPK, Take per package instructions, Disp: 30 tablet, Rfl: 0   Omega-3 Fatty Acids (FISH OIL) 1000 MG CPDR, Take 3,000 mg by mouth 2 (two) times daily., Disp: , Rfl:    omeprazole (PRILOSEC OTC) 20 MG tablet, Take 20 mg by mouth in the morning., Disp: , Rfl:    rosuvastatin (CRESTOR) 5 MG tablet, TAKE ONE TABLET BY MOUTH ONE TIME DAILY, Disp: 90 tablet, Rfl: 1   tadalafil (CIALIS) 20 MG tablet, TAKE ONE-HALF TO ONE TABLET BY MOUTH ONE TIME EVERY OTHER DAY AS NEEDED FOR ERECTILE  DYSFUNCTION, Disp: 10 tablet, Rfl: 11   nitroGLYCERIN (NITROSTAT) 0.4 MG SL tablet, Place 1 tablet (0.4 mg total) under the tongue every 5 (five) minutes as needed for chest pain., Disp: 100 tablet, Rfl: 2   Objective:     Vitals:   08/08/22 1500  BP: 122/84  Pulse: 66  SpO2: 96%  Weight: 192 lb (87.1 kg)  Height: 5\' 7"  (1.702 m)      Body mass index is 30.07 kg/m.    Physical Exam:    Gen: Appears well, nad, nontoxic and pleasant Psych: Alert and oriented, appropriate mood and affect Neuro: sensation intact, strength is 5/5 in upper and lower extremities, muscle tone wnl Skin: no susupicious lesions or rashes  Back - Normal skin,  Spine with normal alignment and no deformity.   No tenderness to vertebral process palpation.   Right lumbar paraspinous muscles are mildly tender and without spasm NTTP gluteal musculature Straight leg raise negative for radicular symptoms, though bilateral leg raise reproduced right low back pain Trendelenberg negative Gait normal   Electronically signed by:  Gerald [name] [facility] 3:18 PM 08/08/22

## 2022-08-08 ENCOUNTER — Ambulatory Visit (INDEPENDENT_AMBULATORY_CARE_PROVIDER_SITE_OTHER): Payer: Medicare Other | Admitting: Sports Medicine

## 2022-08-08 VITALS — BP 122/84 | HR 66 | Ht 67.0 in | Wt 192.0 lb

## 2022-08-08 DIAGNOSIS — M545 Low back pain, unspecified: Secondary | ICD-10-CM | POA: Diagnosis not present

## 2022-08-08 MED ORDER — MELOXICAM 15 MG PO TABS: 15.0000 mg | ORAL_TABLET | Freq: Every day | ORAL | 0 refills | Status: DC

## 2022-08-08 NOTE — Patient Instructions (Addendum)
Good to see you  ?- Start meloxicam 15 mg daily x2 weeks.  If still having pain after 2 weeks, complete 3rd-week of meloxicam. May use remaining meloxicam as needed once daily for pain control.  Do not to use additional NSAIDs while taking meloxicam.  May use Tylenol 500-1000 mg 2 to 3 times a day for breakthrough pain. ?Low back HEP  ?3 week follow up  ?

## 2022-08-17 ENCOUNTER — Encounter: Payer: Self-pay | Admitting: Family

## 2022-08-26 ENCOUNTER — Other Ambulatory Visit: Payer: Self-pay | Admitting: Family

## 2022-08-26 DIAGNOSIS — E782 Mixed hyperlipidemia: Secondary | ICD-10-CM

## 2022-08-26 NOTE — Progress Notes (Signed)
Gerald [name] D.[facility] [address] [place] [address] Phone: [phone]   Assessment and Plan:     1. Acute right-sided low back pain without sciatica  -Acute, uncomplicated, subsequent visit - Significant improvement with resolution of symptoms after completing 2 to 3-week course of meloxicam.  Still most consistent with lumbar dysfunction and musculoskeletal flare - Discontinue meloxicam and may use remainder as needed - Recommend using Tylenol for day-to-day pain relief - Continue HEP for low back  Pertinent previous records reviewed include none   Follow Up: As needed   Subjective:   I, Gerald [name], am serving as a Neurosurgeon for Doctor Richardean Sale   Chief Complaint: back pain    HPI:    08/08/2022 Patient is a 77 year old male complaining of back pain. 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 Right sided low back pain that is throbbing , tylenol and ibu for the pain and that works, advil works the best , no numbness or tingling , pain radiates up the back some , he had pain when laying down   09/01/2022 Patient states that he is fine pain went away     Relevant Historical Information: Hypertension, GERD    Additional pertinent review of systems negative.   Current Outpatient Medications:    aspirin EC 81 MG tablet, Take 1 tablet (81 mg total) by mouth daily., Disp: 90 tablet, Rfl: 3   Ciclopirox 1 % shampoo, APP TO SCALP UTD AND PRN, Disp: , Rfl: 2   famotidine (PEPCID) 20 MG tablet, Take one tablet daily as needed for breakthrough GERD., Disp: , Rfl:    Glycerin-Polysorbate 80 (REFRESH DRY EYE THERAPY OP), Apply to eye as needed., Disp: , Rfl:    ketoconazole (NIZORAL) 2 % shampoo, Apply 1 application topically as needed for irritation. For eczema, Disp: , Rfl:    losartan (COZAAR) 100 MG tablet, Take 1 tablet (100 mg total) by mouth  daily., Disp: 90 tablet, Rfl: 1   meloxicam (MOBIC) 15 MG tablet, Take 1 tablet (15 mg total) by mouth daily., Disp: 30 tablet, Rfl: 0   Methylcellulose, Laxative, (CITRUCEL PO), Take by mouth as directed., Disp: , Rfl:    metoprolol tartrate (LOPRESSOR) 25 MG tablet, Take 1 tablet (25 mg total) by mouth 2 (two) times daily., Disp: 180 tablet, Rfl: 1   Multiple Vitamins-Minerals (MULTIVITAMIN ADULTS 50+ PO), Take 1 tablet by mouth daily., Disp: , Rfl:    nirmatrelvir & ritonavir (PAXLOVID, 300/100,) 20 x 150 MG & 10 x 100MG  TBPK, Take per package instructions, Disp: 30 tablet, Rfl: 0   Omega-3 Fatty Acids (FISH OIL) 1000 MG CPDR, Take 3,000 mg by mouth 2 (two) times daily., Disp: , Rfl:    omeprazole (PRILOSEC OTC) 20 MG tablet, Take 20 mg by mouth in the morning., Disp: , Rfl:    rosuvastatin (CRESTOR) 5 MG tablet, TAKE ONE TABLET BY MOUTH ONE TIME DAILY, Disp: 90 tablet, Rfl: 0   tadalafil (CIALIS) 20 MG tablet, TAKE ONE-HALF TO ONE TABLET BY MOUTH ONE TIME EVERY OTHER DAY AS NEEDED FOR ERECTILE DYSFUNCTION, Disp: 10 tablet, Rfl: 11   nitroGLYCERIN (NITROSTAT) 0.4 MG SL tablet, Place 1 tablet (0.4 mg total) under the tongue every 5 (five) minutes as needed for chest pain., Disp: 100 tablet, Rfl: 2   Objective:     Vitals:  09/01/22 1509  BP: 122/84  Pulse: 74  SpO2: 96%  Weight: 190 lb (86.2 kg)  Height: 5\' 7"  (1.702 m)      Body mass index is 29.76 kg/m.    Physical Exam:    Gen: Appears well, nad, nontoxic and pleasant Psych: Alert and oriented, appropriate mood and affect Neuro: sensation intact, strength is 5/5 in upper and lower extremities, muscle tone wnl Skin: no susupicious lesions or rashes  Back - Normal skin, Spine with normal alignment and no deformity.   No tenderness to vertebral process palpation.   Paraspinous muscles are not tender and without spasm NTTP gluteal musculature Straight leg raise negative Trendelenberg negative Piriformis Test negative     Electronically signed by:  Gerald [name] [facility] 3:16 PM 09/01/22

## 2022-09-01 ENCOUNTER — Ambulatory Visit (INDEPENDENT_AMBULATORY_CARE_PROVIDER_SITE_OTHER): Payer: Medicare Other | Admitting: Sports Medicine

## 2022-09-01 VITALS — BP 122/84 | HR 74 | Ht 67.0 in | Wt 190.0 lb

## 2022-09-01 DIAGNOSIS — M545 Low back pain, unspecified: Secondary | ICD-10-CM

## 2022-09-17 ENCOUNTER — Other Ambulatory Visit: Payer: Self-pay | Admitting: Family

## 2022-10-09 DIAGNOSIS — M79671 Pain in right foot: Secondary | ICD-10-CM | POA: Diagnosis not present

## 2022-10-09 DIAGNOSIS — L84 Corns and callosities: Secondary | ICD-10-CM | POA: Diagnosis not present

## 2022-10-09 DIAGNOSIS — B351 Tinea unguium: Secondary | ICD-10-CM | POA: Diagnosis not present

## 2022-10-09 DIAGNOSIS — M79672 Pain in left foot: Secondary | ICD-10-CM | POA: Diagnosis not present

## 2022-10-27 DIAGNOSIS — R972 Elevated prostate specific antigen [PSA]: Secondary | ICD-10-CM | POA: Diagnosis not present

## 2022-11-03 DIAGNOSIS — N3943 Post-void dribbling: Secondary | ICD-10-CM | POA: Diagnosis not present

## 2022-11-03 DIAGNOSIS — N401 Enlarged prostate with lower urinary tract symptoms: Secondary | ICD-10-CM | POA: Diagnosis not present

## 2022-11-17 ENCOUNTER — Other Ambulatory Visit: Payer: Self-pay

## 2022-11-18 ENCOUNTER — Ambulatory Visit: Payer: Medicare Other | Attending: Cardiology

## 2022-11-18 VITALS — BP 128/62 | HR 62 | Ht 67.0 in | Wt 192.0 lb

## 2022-11-18 DIAGNOSIS — E782 Mixed hyperlipidemia: Secondary | ICD-10-CM | POA: Diagnosis not present

## 2022-11-18 DIAGNOSIS — I10 Essential (primary) hypertension: Secondary | ICD-10-CM | POA: Insufficient documentation

## 2022-11-18 DIAGNOSIS — I2510 Atherosclerotic heart disease of native coronary artery without angina pectoris: Secondary | ICD-10-CM | POA: Diagnosis not present

## 2022-11-18 MED ORDER — ROSUVASTATIN CALCIUM 10 MG PO TABS: 10.0000 mg | ORAL_TABLET | Freq: Every day | ORAL | 3 refills | Status: AC

## 2022-11-18 NOTE — Progress Notes (Signed)
Cardiology Consultation:    Date:  11/18/2022   ID:  Gerald [name], DOB 14-Aug-1945, MRN [id number]  PCP:  Sandford Craze, NP  Cardiologist:  Marlyn Corporal Dare Sanger, MD   Referring MD: Sandford Craze, NP    History of Present Illness:    Gerald [name] is a 77 y.o. male who is being seen today for the evaluation of coronary artery disease follow-up at the request of Sandford Craze, NP.   Here for follow-up visit today last visit at our office was June 2023 with Dr. Dulce Sellar.  He has a history of CAD, hypertension, hyperlipidemia. Previous workup for CAD was with CTA coronary angiogram and calcium score in August 2020 that noted calcium score of 119 and mild to moderate disease involving LAD and RCA distribution.  He previously had a stress test with nuclear imaging in 2017 that showed no evidence of ischemia.Last echocardiogram was January 2017 with LVEF 60 to 65%, grade 1 diastolic dysfunction, trivial aortic insufficiency.  Very pleasant gentleman here for visit, mentions overall things are stable for him.  Lives at home with his wife and his son in his 37s who has cerebral palsy and wheelchair-bound.  They care for their son at home and have home caregiver for him.  He keeps himself busy at home with household work.  No regular exercise.  Denies any active symptoms of chest pain or shortness of breath.  Denies any lightheadedness or dizziness or palpitations.  Denies any pedal edema.  Mentions good blood pressure control on PCP visits.  Last lipid panel from October 2023 LDL 82, total cholesterol 409, triglycerides 343, HDL 36 VLDL 68 Basic metabolic panel from April 2024 noted sodium 142, potassium 4.3, BUN 27, creatinine 1.16, EGFR 61   Past Medical History:  Diagnosis Date   Abrasion of left ear canal 07/08/2022   APC (atrial premature contractions) 10/18/2018   Back pain, chronic    Benign localized prostatic hyperplasia with lower urinary tract symptoms  (LUTS)    BPH (benign prostatic hyperplasia) 09/17/2016   COLONIC POLYPS, ADENOMATOUS, HX OF 11/27/2008   Qualifier: Diagnosis of   By: Koleen Distance CMA (AAMA), Leisha         Depression    Diverticulosis of colon    Elevated PSA    Erectile dysfunction 07/08/2022   Family history of colon cancer 01/14/2011   Fatty liver    Gastroesophageal reflux disease 01/14/2011   Hiatal hernia    History of alcohol abuse    quit 1989   History of esophageal stricture    2003  S/P  DILATATION   History of esophagitis    History of melanoma in situ    06/ 2017  REMOVAL RIGHT CHEEK AREA   Hx of adenomatous colonic polyps    2003   TUBULAR ADENOMA   Hyperlipidemia    Hypertension    Melanoma of skin (HCC) 09/26/2015   Mild sleep apnea    2004 mild, per sleep study-- no cpap recommended  (pt denies)    Past Surgical History:  Procedure Laterality Date   CARDIOVASCULAR STRESS TEST  04-17-2015  dr Tresa Endo   Normal stress nulcear study w/ a small, mild, fixed inferior defect consistent with inferior thinning; no ischemia; normal LV function and wall motion , stress ef 56%   COLONOSCOPY  last one 12-02-2006   CYSTOSCOPY WITH URETHRAL DILATATION N/A 10/27/2017   Procedure: CYSTOSCOPY WITH URETHRAL DILATATION;  Surgeon: Rene Paci, MD;  Location: [facility];  Service: Urology;  Laterality: N/A;  ONLY NEEDS 90 MIN FOR ALL PROCEDURES   EXCISION LEFT ARM MASS  1988   MELANOMA EXCISION     MOHS SURGERY  09/06/2015   right parotidemomasseteric excision w/ complex repair for melanoma in situ   THULIUM LASER TURP (TRANSURETHRAL RESECTION OF PROSTATE) N/A 07/29/2016   Procedure: Morton Peters LASER TURP (TRANSURETHRAL RESECTION OF PROSTATE)/ TRANSRECTAL ULTRASOUND GUIDED PROSTATE BIOPSY;  Surgeon: Hildred Laser, MD;  Location: [facility];  Service: Urology;  Laterality: N/A;   TONSILLECTOMY AND ADENOIDECTOMY  child   TRANSTHORACIC ECHOCARDIOGRAM  04/16/2015   moderate  LVH,  ef 60-65%, grade 1 diastolic dysfunction/  trivial AR, MR and PR/  mild dilated ascending aorta/  mild TR   TRANSURETHRAL RESECTION OF PROSTATE N/A 10/27/2017   Procedure: TRANSURETHRAL RESECTION OF THE PROSTATE (TURP);  Surgeon: Rene Paci, MD;  Location: [facility];  Service: Urology;  Laterality: N/A;   UPPER GASTROINTESTINAL ENDOSCOPY  last one 02-15-2014    Current Medications: Current Meds  Medication Sig   aspirin EC 81 MG tablet Take 1 tablet (81 mg total) by mouth daily.   Ciclopirox 1 % shampoo Apply 1 Application topically daily.   famotidine (PEPCID) 20 MG tablet Take one tablet daily as needed for breakthrough GERD.   Glycerin-Polysorbate 80 (REFRESH DRY EYE THERAPY OP) Apply 1 drop to eye as needed (dry or itchy eyes).   ketoconazole (NIZORAL) 2 % shampoo Apply 1 application topically as needed for irritation. For eczema   losartan (COZAAR) 100 MG tablet Take 1 tablet (100 mg total) by mouth daily.   meloxicam (MOBIC) 15 MG tablet Take 1 tablet (15 mg total) by mouth daily.   metoprolol tartrate (LOPRESSOR) 25 MG tablet Take 1 tablet (25 mg total) by mouth 2 (two) times daily.   Multiple Vitamins-Minerals (MULTIVITAMIN ADULTS 50+ PO) Take 1 tablet by mouth daily.   nirmatrelvir & ritonavir (PAXLOVID, 300/100,) 20 x 150 MG & 10 x 100MG  TBPK Take per package instructions   nitroGLYCERIN (NITROSTAT) 0.4 MG SL tablet Place 0.4 mg under the tongue every 5 (five) minutes as needed for chest pain.   Omega-3 Fatty Acids (FISH OIL) 1000 MG CPDR Take 3,000 mg by mouth 2 (two) times daily.   omeprazole (PRILOSEC OTC) 20 MG tablet Take 20 mg by mouth in the morning.   rosuvastatin (CRESTOR) 10 MG tablet Take 1 tablet (10 mg total) by mouth daily.   tadalafil (CIALIS) 20 MG tablet TAKE ONE-HALF TO ONE TABLET BY MOUTH ONE TIME EVERY OTHER DAY AS NEEDED FOR ERECTILE DYSFUNCTION   [DISCONTINUED] rosuvastatin (CRESTOR) 5 MG tablet TAKE ONE TABLET BY MOUTH ONE  TIME DAILY     Allergies:   Augmentin [amoxicillin-pot clavulanate], Tessalon [benzonatate], and Penicillins   Social History   Socioeconomic History   Marital status: Married    Spouse name: Not on file   Number of children: 2   Years of education: Not on file   Highest education level: Bachelor's degree (e.g., BA, AB, BS)  Occupational History   Occupation: Retired  Tobacco Use   Smoking status: Former    Current packs/day: 0.00    Average packs/day: 2.0 packs/day for 30.0 years (60.0 ttl pk-yrs)    Types: Cigarettes    Start date: 08/28/1976    Quit date: 08/29/2006    Years since quitting: 16.2    Passive exposure: Past   Smokeless tobacco: Never  Vaping Use   Vaping status: Never  Used  Substance and Sexual Activity   Alcohol use: No   Drug use: No   Sexual activity: Yes  Other Topics Concern   Not on file  Social History Narrative   Diet: regular, lots of vegetables-----exercise : none but active   Caffeine use:  5 cups coffee daily         Social Determinants of Health   Financial Resource Strain: Low Risk  (03/19/2022)   Overall Financial Resource Strain (CARDIA)    Difficulty of Paying Living Expenses: Not hard at all  Food Insecurity: No Food Insecurity (07/06/2022)   Hunger Vital Sign    Worried About Running Out of Food in the Last Year: Never true    Ran Out of Food in the Last Year: Never true  Transportation Needs: No Transportation Needs (07/06/2022)   PRAPARE - Administrator, Civil Service (Medical): No    Lack of Transportation (Non-Medical): No  Physical Activity: Inactive (07/06/2022)   Exercise Vital Sign    Days of Exercise per Week: 0 days    Minutes of Exercise per Session: 0 min  Stress: No Stress Concern Present (07/06/2022)   [employer] of Occupational Health - Occupational Stress Questionnaire    Feeling of Stress : Not at all  Social Connections: Unknown (07/06/2022)   Social Connection and Isolation Panel [NHANES]     Frequency of Communication with Friends and Family: Patient declined    Frequency of Social Gatherings with Friends and Family: Once a week    Attends Religious Services: More than 4 times per year    Active Member of [employer] or Organizations: Yes    Attends Engineer, structural: More than 4 times per year    Marital Status: Married     Family History: The patient's family history includes Colon cancer in his father; Colon cancer (age of onset: 51) in his maternal grandmother; Coronary artery disease in his father, maternal grandmother, and paternal grandmother; Heart attack in his maternal grandfather and paternal grandfather; Hypertension in his maternal aunt; Melanoma in his mother; Uterine cancer in his mother. There is no history of Prostate cancer, Stroke, Esophageal cancer, Pancreatic cancer, Rectal cancer, or Stomach cancer. ROS:   Please see the history of present illness.    All 14 point review of systems negative except as described per history of present illness.  EKGs/Labs/Other Studies Reviewed:    The following studies were reviewed today:   EKG:  EKG Interpretation Date/Time:  Tuesday November 18 2022 14:05:43 EDT Ventricular Rate:  62 PR Interval:  188 QRS Duration:  88 QT Interval:  378 QTC Calculation: 383 R Axis:   -40  Text Interpretation: Sinus rhythm with Premature atrial complexes Left axis deviation Possible Anterior infarct (cited on or before 18-Nov-2022) When compared with ECG of 27-Oct-2017 05:53, Premature atrial complexes are now Present Confirmed by Huntley Dec reddy [phone]) on 11/18/2022 2:28:47 PM    Recent Labs: 12/31/2021: ALT 27 07/08/2022: BUN 27; Creatinine, Ser 1.16; Potassium 4.3; Sodium 142  Recent Lipid Panel    Component Value Date/Time   CHOL 154 12/31/2021 1557   CHOL 150 04/24/2020 1426   TRIG 343.0 (H) 12/31/2021 1557   HDL 36.90 (L) 12/31/2021 1557   HDL 42 04/24/2020 1426   CHOLHDL 4 12/31/2021 1557   VLDL 68.6 (H)  12/31/2021 1557   LDLCALC 65 04/24/2020 1426   LDLDIRECT 82.0 12/31/2021 1557    Physical Exam:    VS:  BP 128/62  Pulse 62   Ht 5\' 7"  (1.702 m)   Wt 192 lb (87.1 kg)   SpO2 95%   BMI 30.07 kg/m     Wt Readings from Last 3 Encounters:  11/18/22 192 lb (87.1 kg)  09/01/22 190 lb (86.2 kg)  08/08/22 192 lb (87.1 kg)     GENERAL:  Well nourished, well developed in no acute distress NECK: No JVD; No carotid bruits CARDIAC: RRR, S1 and S2 present, no murmurs, no rubs, no gallops CHEST:  Clear to auscultation without rales, wheezing or rhonchi  Extremities: No pitting pedal edema. Pulses bilaterally symmetric with radial 2+ and dorsalis pedis 2+ NEUROLOGIC:  Alert and oriented x 3   ASSESSMENT AND PLAN:   Very pleasant 77 year old male patient with history of coronary artery disease based on CT coronary angiogram, hyperlipidemia, hyper tension presents for his annual follow-up. Problem List Items Addressed This Visit     Hyperlipidemia    Hyperlipidemia.  Last lipid panel from October 2023 LDL in the 80s. Currently remains on rosuvastatin 5 mg once daily. Triglycerides also are elevated.  Suggested increasing rosuvastatin to 10 mg once daily, he is agreeable. Continue with dietary modifications to target lowering triglycerides.      Relevant Medications   nitroGLYCERIN (NITROSTAT) 0.4 MG SL tablet   rosuvastatin (CRESTOR) 10 MG tablet   Hypertension - Primary    Blood pressure well-controlled. Continue with target below 130 over 80 mmHg. Continue current medications metoprolol tartrate 25 mg twice daily and losartan 100 mg once a day.       Relevant Medications   nitroGLYCERIN (NITROSTAT) 0.4 MG SL tablet   rosuvastatin (CRESTOR) 10 MG tablet   Other Relevant Orders   EKG 12-Lead (Completed)   Coronary artery disease involving native coronary artery of native heart without angina pectoris   Relevant Medications   nitroGLYCERIN (NITROSTAT) 0.4 MG SL tablet    rosuvastatin (CRESTOR) 10 MG tablet  Return to clinic on an as-needed basis.   Medication Adjustments/Labs and Tests Ordered: Current medicines are reviewed at length with the patient today.  Concerns regarding medicines are outlined above.  Orders Placed This Encounter  Procedures   EKG 12-Lead   Meds ordered this encounter  Medications   rosuvastatin (CRESTOR) 10 MG tablet    Sig: Take 1 tablet (10 mg total) by mouth daily.    Dispense:  90 tablet    Refill:  3    Signed, Gae Bihl reddy Saara Kijowski, MD, MPH, [facility]. 11/18/2022 2:49 PM     [facility]

## 2022-11-18 NOTE — Assessment & Plan Note (Signed)
Blood pressure well-controlled. Continue with target below 130 over 80 mmHg. Continue current medications metoprolol tartrate 25 mg twice daily and losartan 100 mg once a day.

## 2022-11-18 NOTE — Patient Instructions (Signed)
Medication Instructions:  Your physician has recommended you make the following change in your medication:   START: Rosuvastatin 10 mg daily  *If you need a refill on your cardiac medications before your next appointment, please call your pharmacy*   Lab Work: None If you have labs (blood work) drawn today and your tests are completely normal, you will receive your results only by: MyChart Message (if you have MyChart) OR A paper copy in the mail If you have any lab test that is abnormal or we need to change your treatment, we will call you to review the results.   Testing/Procedures: None   Follow-Up: At [facility], you and your health needs are our priority.  As part of our continuing mission to provide you with exceptional heart care, we have created designated Provider Care Teams.  These Care Teams include your primary Cardiologist (physician) and Advanced Practice Providers (APPs -  Physician Assistants and Nurse Practitioners) who all work together to provide you with the care you need, when you need it.  We recommend signing up for the patient portal called "MyChart".  Sign up information is provided on this After Visit Summary.  MyChart is used to connect with patients for Virtual Visits (Telemedicine).  Patients are able to view lab/test results, encounter notes, upcoming appointments, etc.  Non-urgent messages can be sent to your provider as well.   To learn more about what you can do with MyChart, go to [email or web address].    Your next appointment:   Follow up as needed  Provider:   Follow up as needed  Other Instructions None

## 2022-11-18 NOTE — Assessment & Plan Note (Signed)
Hyperlipidemia.  Last lipid panel from October 2023 LDL in the 80s. Currently remains on rosuvastatin 5 mg once daily. Triglycerides also are elevated.  Suggested increasing rosuvastatin to 10 mg once daily, he is agreeable. Continue with dietary modifications to target lowering triglycerides.

## 2022-12-02 ENCOUNTER — Encounter: Payer: Self-pay | Admitting: Family

## 2022-12-02 ENCOUNTER — Ambulatory Visit: Payer: Medicare Other | Admitting: Family

## 2022-12-05 ENCOUNTER — Encounter: Payer: Self-pay | Admitting: Family

## 2022-12-12 ENCOUNTER — Encounter: Payer: Self-pay | Admitting: Family

## 2022-12-15 DIAGNOSIS — L84 Corns and callosities: Secondary | ICD-10-CM | POA: Diagnosis not present

## 2022-12-15 DIAGNOSIS — B351 Tinea unguium: Secondary | ICD-10-CM | POA: Diagnosis not present

## 2022-12-15 DIAGNOSIS — M79672 Pain in left foot: Secondary | ICD-10-CM | POA: Diagnosis not present

## 2022-12-15 DIAGNOSIS — M79671 Pain in right foot: Secondary | ICD-10-CM | POA: Diagnosis not present

## 2022-12-16 ENCOUNTER — Encounter: Payer: Self-pay | Admitting: Family

## 2022-12-29 ENCOUNTER — Ambulatory Visit: Payer: Medicare Other | Admitting: Cardiology

## 2023-01-02 ENCOUNTER — Ambulatory Visit (INDEPENDENT_AMBULATORY_CARE_PROVIDER_SITE_OTHER): Payer: Medicare Other | Admitting: Family

## 2023-01-02 ENCOUNTER — Encounter: Payer: Self-pay | Admitting: Family

## 2023-01-02 VITALS — BP 129/62 | HR 58 | Temp 97.9°F | Resp 16 | Wt 187.0 lb

## 2023-01-02 DIAGNOSIS — R972 Elevated prostate specific antigen [PSA]: Secondary | ICD-10-CM

## 2023-01-02 DIAGNOSIS — I10 Essential (primary) hypertension: Secondary | ICD-10-CM

## 2023-01-02 DIAGNOSIS — N529 Male erectile dysfunction, unspecified: Secondary | ICD-10-CM

## 2023-01-02 DIAGNOSIS — E782 Mixed hyperlipidemia: Secondary | ICD-10-CM

## 2023-01-02 DIAGNOSIS — C439 Malignant melanoma of skin, unspecified: Secondary | ICD-10-CM | POA: Diagnosis not present

## 2023-01-02 DIAGNOSIS — N401 Enlarged prostate with lower urinary tract symptoms: Secondary | ICD-10-CM | POA: Diagnosis not present

## 2023-01-02 LAB — LIPID PANEL
Cholesterol: 154 mg/dL (ref 0–200)
HDL: 42.9 mg/dL (ref >39.00)
LDL Cholesterol: 93 mg/dL (ref 0–99)
NonHDL: 111
Total CHOL/HDL Ratio: 4
Triglycerides: 92 mg/dL (ref 0.0–149.0)
VLDL: 18.4 mg/dL (ref 0.0–40.0)

## 2023-01-02 LAB — COMPREHENSIVE METABOLIC PANEL
ALT: 26 U/L (ref 0–53)
AST: 24 U/L (ref 0–37)
Albumin: 4 g/dL (ref 3.5–5.2)
Alkaline Phosphatase: 71 U/L (ref 39–117)
BUN: 22 mg/dL (ref 6–23)
CO2: 29 meq/L (ref 19–32)
Calcium: 9.1 mg/dL (ref 8.4–10.5)
Chloride: 105 meq/L (ref 96–112)
Creatinine, Ser: 0.92 mg/dL (ref 0.40–1.50)
GFR: 80.92 mL/min (ref >60.00)
Glucose, Bld: 85 mg/dL (ref 70–99)
Potassium: 4.1 meq/L (ref 3.5–5.1)
Sodium: 141 meq/L (ref 135–145)
Total Bilirubin: 0.7 mg/dL (ref 0.2–1.2)
Total Protein: 6.3 g/dL (ref 6.0–8.3)

## 2023-01-02 NOTE — Assessment & Plan Note (Signed)
Continues routine screening with dermatology.

## 2023-01-02 NOTE — Assessment & Plan Note (Signed)
Tolerating every other day rosuvastatin. Continue same.

## 2023-01-02 NOTE — Progress Notes (Signed)
Subjective:     Patient ID: Gerald [name], male    DOB: 1945/07/17, 77 y.o.   MRN: [id number]  Chief Complaint  Patient presents with   Hypertension    Here for follow up    HPI  Discussed the use of AI scribe software for clinical note transcription with the patient, who gave verbal consent to proceed.  History of Present Illness   The patient presents for a routine follow-up. He reports that his son has been struggling with recurrent ileus attacks, which have been managed by gastroenterology. The patient himself is on losartan 100mg , metoprolol 25mg  twice daily, and rosuvastatin every other day due to leg cramping side effects. He states that he has been discharged from follow-up by his cardiologist and urologist, with the understanding to call if he has any concerns. He has a history of two prostate biopsies in 2009 and 2018, both of which were benign. His last PSA in July was 2.75, which was considered good for his age. He takes meloxicam as needed, and omeprazole daily for reflux. He has declined the flu shot at this visit, planning to get it at his local pharmacy along with his family.          Health Maintenance Due  Topic Date Due   Zoster Vaccines- Shingrix (1 of 2) 09/15/1965   COVID-19 Vaccine (5 - 2023-24 season) 11/30/2022    Past Medical History:  Diagnosis Date   Abrasion of left ear canal 07/08/2022   APC (atrial premature contractions) 10/18/2018   Back pain, chronic    Benign localized prostatic hyperplasia with lower urinary tract symptoms (LUTS)    BPH (benign prostatic hyperplasia) 09/17/2016   COLONIC POLYPS, ADENOMATOUS, HX OF 11/27/2008   Qualifier: Diagnosis of   By: Koleen Distance CMA (AAMA), Leisha         Depression    Diverticulosis of colon    Elevated PSA    Erectile dysfunction 07/08/2022   Family history of colon cancer 01/14/2011   Fatty liver    Gastroesophageal reflux disease 01/14/2011   Hiatal hernia    History of alcohol abuse     quit 1989   History of esophageal stricture    2003  S/P  DILATATION   History of esophagitis    History of melanoma in situ    06/ 2017  REMOVAL RIGHT CHEEK AREA   Hx of adenomatous colonic polyps    2003   TUBULAR ADENOMA   Hyperlipidemia    Hypertension    Melanoma of skin (HCC) 09/26/2015   Mild sleep apnea    2004 mild, per sleep study-- no cpap recommended  (pt denies)    Past Surgical History:  Procedure Laterality Date   CARDIOVASCULAR STRESS TEST  04-17-2015  dr Tresa Endo   Normal stress nulcear study w/ a small, mild, fixed inferior defect consistent with inferior thinning; no ischemia; normal LV function and wall motion , stress ef 56%   COLONOSCOPY  last one 12-02-2006   CYSTOSCOPY WITH URETHRAL DILATATION N/A 10/27/2017   Procedure: CYSTOSCOPY WITH URETHRAL DILATATION;  Surgeon: Rene Paci, MD;  Location: [facility];  Service: Urology;  Laterality: N/A;  ONLY NEEDS 90 MIN FOR ALL PROCEDURES   EXCISION LEFT ARM MASS  1988   MELANOMA EXCISION     MOHS SURGERY  09/06/2015   right parotidemomasseteric excision w/ complex repair for melanoma in situ   THULIUM LASER TURP (TRANSURETHRAL RESECTION OF PROSTATE) N/A 07/29/2016   Procedure:  THULIUM LASER TURP (TRANSURETHRAL RESECTION OF PROSTATE)/ TRANSRECTAL ULTRASOUND GUIDED PROSTATE BIOPSY;  Surgeon: Hildred Laser, MD;  Location: [facility];  Service: Urology;  Laterality: N/A;   TONSILLECTOMY AND ADENOIDECTOMY  child   TRANSTHORACIC ECHOCARDIOGRAM  04/16/2015   moderate LVH,  ef 60-65%, grade 1 diastolic dysfunction/  trivial AR, MR and PR/  mild dilated ascending aorta/  mild TR   TRANSURETHRAL RESECTION OF PROSTATE N/A 10/27/2017   Procedure: TRANSURETHRAL RESECTION OF THE PROSTATE (TURP);  Surgeon: Rene Paci, MD;  Location: [facility];  Service: Urology;  Laterality: N/A;   UPPER GASTROINTESTINAL ENDOSCOPY  last one 02-15-2014    Family History   Problem Relation Age of Onset   Melanoma Mother    Uterine cancer Mother    Coronary artery disease Father    Colon cancer Father        ? unknown age dx/died at 58   Coronary artery disease Maternal Grandmother    Colon cancer Maternal Grandmother 74   Heart attack Maternal Grandfather    Coronary artery disease Paternal Grandmother    Heart attack Paternal Grandfather    Hypertension Maternal Aunt    Prostate cancer Neg Hx    Stroke Neg Hx    Esophageal cancer Neg Hx    Pancreatic cancer Neg Hx    Rectal cancer Neg Hx    Stomach cancer Neg Hx     Social History   Socioeconomic History   Marital status: Married    Spouse name: Not on file   Number of children: 2   Years of education: Not on file   Highest education level: Bachelor's degree (e.g., BA, AB, BS)  Occupational History   Occupation: Retired  Tobacco Use   Smoking status: Former    Current packs/day: 0.00    Average packs/day: 2.0 packs/day for 30.0 years (60.0 ttl pk-yrs)    Types: Cigarettes    Start date: 08/28/1976    Quit date: 08/29/2006    Years since quitting: 16.3    Passive exposure: Past   Smokeless tobacco: Never  Vaping Use   Vaping status: Never Used  Substance and Sexual Activity   Alcohol use: No   Drug use: No   Sexual activity: Yes  Other Topics Concern   Not on file  Social History Narrative   Diet: regular, lots of vegetables-----exercise : none but active   Caffeine use:  5 cups coffee daily         Social Determinants of Health   Financial Resource Strain: Low Risk  (03/19/2022)   Overall Financial Resource Strain (CARDIA)    Difficulty of Paying Living Expenses: Not hard at all  Food Insecurity: No Food Insecurity (07/06/2022)   Hunger Vital Sign    Worried About Running Out of Food in the Last Year: Never true    Ran Out of Food in the Last Year: Never true  Transportation Needs: No Transportation Needs (07/06/2022)   PRAPARE - Administrator, Civil Service  (Medical): No    Lack of Transportation (Non-Medical): No  Physical Activity: Inactive (07/06/2022)   Exercise Vital Sign    Days of Exercise per Week: 0 days    Minutes of Exercise per Session: 0 min  Stress: No Stress Concern Present (07/06/2022)   [employer] of Occupational Health - Occupational Stress Questionnaire    Feeling of Stress : Not at all  Social Connections: Unknown (07/06/2022)   Social Connection and Isolation  Panel [NHANES]    Frequency of Communication with Friends and Family: Patient declined    Frequency of Social Gatherings with Friends and Family: Once a week    Attends Religious Services: More than 4 times per year    Active Member of [employer] or Organizations: Yes    Attends Engineer, structural: More than 4 times per year    Marital Status: Married  Catering manager Violence: Not At Risk (03/19/2022)   Humiliation, Afraid, Rape, and Kick questionnaire    Fear of Current or Ex-Partner: No    Emotionally Abused: No    Physically Abused: No    Sexually Abused: No    Outpatient Medications Prior to Visit  Medication Sig Dispense Refill   aspirin EC 81 MG tablet Take 1 tablet (81 mg total) by mouth daily. 90 tablet 3   Ciclopirox 1 % shampoo Apply 1 Application topically daily.  2   Glycerin-Polysorbate 80 (REFRESH DRY EYE THERAPY OP) Apply 1 drop to eye as needed (dry or itchy eyes).     ketoconazole (NIZORAL) 2 % shampoo Apply 1 application topically as needed for irritation. For eczema     losartan (COZAAR) 100 MG tablet Take 1 tablet (100 mg total) by mouth daily. 90 tablet 1   meloxicam (MOBIC) 15 MG tablet Take 1 tablet (15 mg total) by mouth daily. 30 tablet 0   metoprolol tartrate (LOPRESSOR) 25 MG tablet Take 1 tablet (25 mg total) by mouth 2 (two) times daily. 180 tablet 1   Multiple Vitamins-Minerals (MULTIVITAMIN ADULTS 50+ PO) Take 1 tablet by mouth daily.     nitroGLYCERIN (NITROSTAT) 0.4 MG SL tablet Place 0.4 mg under the tongue every 5  (five) minutes as needed for chest pain.     Omega-3 Fatty Acids (FISH OIL) 1000 MG CPDR Take 3,000 mg by mouth 2 (two) times daily.     omeprazole (PRILOSEC OTC) 20 MG tablet Take 20 mg by mouth in the morning.     rosuvastatin (CRESTOR) 10 MG tablet Take 1 tablet (10 mg total) by mouth daily. 90 tablet 3   tadalafil (CIALIS) 20 MG tablet TAKE ONE-HALF TO ONE TABLET BY MOUTH ONE TIME EVERY OTHER DAY AS NEEDED FOR ERECTILE DYSFUNCTION 10 tablet 11   famotidine (PEPCID) 20 MG tablet Take one tablet daily as needed for breakthrough GERD.     nirmatrelvir & ritonavir (PAXLOVID, 300/100,) 20 x 150 MG & 10 x 100MG  TBPK Take per package instructions 30 tablet 0   No facility-administered medications prior to visit.    Allergies  Allergen Reactions   Augmentin [Amoxicillin-Pot Clavulanate] Diarrhea    *Can take plain Amoxicillin* Has patient had a PCN reaction causing immediate rash, facial/tongue/throat swelling, SOB or lightheadedness with hypotension: No Has patient had a PCN reaction causing severe rash involving mucus membranes or skin necrosis: No Has patient had a PCN reaction that required hospitalization No Has patient had a PCN reaction occurring within the last 10 years: Yes If all of the above answers are "NO", then may proceed with Cephalosporin use.    Tessalon [Benzonatate]     Deep uncontrollable cough   Penicillins Rash    Childhood reaction *Can take Amoxicillin* Has patient had a PCN reaction causing immediate rash, facial/tongue/throat swelling, SOB or lightheadedness with hypotension: Unknown Has patient had a PCN reaction causing severe rash involving mucus membranes or skin necrosis: Unknown Has patient had a PCN reaction that required hospitalization: Unknown Has patient had a PCN reaction occurring  within the last 10 years: Unknown  If all of the above answers are "NO", then may proceed with Cephalosporin use    ROS    See HPI Objective:    Physical  Exam Constitutional:      General: He is not in acute distress.    Appearance: He is well-developed.  HENT:     Head: Normocephalic and atraumatic.  Cardiovascular:     Rate and Rhythm: Normal rate and regular rhythm.     Heart sounds: No murmur heard. Pulmonary:     Effort: Pulmonary effort is normal. No respiratory distress.     Breath sounds: Normal breath sounds. No wheezing or rales.  Skin:    General: Skin is warm and dry.  Neurological:     Mental Status: He is alert and oriented to person, place, and time.  Psychiatric:        Behavior: Behavior normal.        Thought Content: Thought content normal.      BP 129/62 (BP Location: Right Arm, Patient Position: Sitting, Cuff Size: Small)   Pulse (!) 58   Temp 97.9 F (36.6 C) (Oral)   Resp 16   Wt 187 lb (84.8 kg)   SpO2 96%   BMI 29.29 kg/m  Wt Readings from Last 3 Encounters:  01/02/23 187 lb (84.8 kg)  11/18/22 192 lb (87.1 kg)  09/01/22 190 lb (86.2 kg)       Assessment & Plan:   Problem List Items Addressed This Visit       Unprioritized   Melanoma of skin (HCC)    Continues routine screening with dermatology.       Hypertension - Primary    BP Readings from Last 3 Encounters:  01/02/23 129/62  11/18/22 128/62  09/01/22 122/84   BP stable on losartan and metoprolol Continue same.       Relevant Orders   Comp Met (CMET)   Hyperlipidemia    Tolerating every other day rosuvastatin. Continue same.       Relevant Orders   Lipid panel   Erectile dysfunction    Continue cialis prn.       Elevated PSA    Reports that urology has signed off.   Lab Results  Component Value Date   PSA 6.28 (H) 09/01/2011   PSA 5.14 (H) 05/14/2011   PSA 4.32 (H) 10/23/2010          Benign localized prostatic hyperplasia with lower urinary tract symptoms (LUTS)    Has followed with [facility].  He has had two negative prostate biopsies in the past and PSA has been stable. Urology recommended  discontinuation of prostate cancer screening.        I have discontinued Chase Picket. Ging "Ben"'s famotidine and Paxlovid (300/100). I am also having him maintain his ketoconazole, Multiple Vitamins-Minerals (MULTIVITAMIN ADULTS 50+ PO), Ciclopirox, Glycerin-Polysorbate 80 (REFRESH DRY EYE THERAPY OP), aspirin EC, omeprazole, Fish Oil, tadalafil, losartan, meloxicam, metoprolol tartrate, nitroGLYCERIN, and rosuvastatin.  No orders of the defined types were placed in this encounter.

## 2023-01-02 NOTE — Assessment & Plan Note (Signed)
BP Readings from Last 3 Encounters:  01/02/23 129/62  11/18/22 128/62  09/01/22 122/84   BP stable on losartan and metoprolol Continue same.

## 2023-01-02 NOTE — Assessment & Plan Note (Signed)
Continue cialis prn.  

## 2023-01-02 NOTE — Assessment & Plan Note (Signed)
Reports that urology has signed off.   Lab Results  Component Value Date   PSA 6.28 (H) 09/01/2011   PSA 5.14 (H) 05/14/2011   PSA 4.32 (H) 10/23/2010

## 2023-01-02 NOTE — Patient Instructions (Signed)
VISIT SUMMARY:  During your recent visit, we discussed your ongoing management of hypertension, hyperlipidemia, and gastroesophageal reflux disease (GERD). We also touched on your prostate health. Your blood pressure is well controlled, and your cholesterol management is ongoing. We also discussed your general health maintenance, including vaccinations and routine lab tests.  YOUR PLAN:  -HYPERTENSION: Hypertension, or high blood pressure, is well controlled with your current medications, Losartan and Metoprolol. Please continue taking these as prescribed.  -HYPERLIPIDEMIA: Hyperlipidemia, or high cholesterol, is being managed with Rosuvastatin. Continue taking this medication as tolerated to help control your cholesterol levels.  -GASTROESOPHAGEAL REFLUX DISEASE (GERD): GERD, a condition where stomach acid frequently flows back into the tube connecting your mouth and stomach (esophagus), is being managed with daily Omeprazole. Please continue taking this medication as prescribed.  -PROSTATE HEALTH: Your prostate health is stable with a last PSA (a test for prostate cancer) of 2.75. No further action is needed at this time.  -GENERAL HEALTH MAINTENANCE: For your overall health, plan to receive your flu shot and consider a COVID booster at your pharmacy. Also, we have ordered labs to check your cholesterol, kidney, and liver function.  INSTRUCTIONS:  Please continue taking your medications as prescribed. Remember to get your flu shot and consider a COVID booster at your pharmacy. Once your lab results are ready, we will contact you to discuss them. If you have any questions or concerns, please don't hesitate to contact our office.

## 2023-01-02 NOTE — Assessment & Plan Note (Signed)
Has followed with [facility].  He has had two negative prostate biopsies in the past and PSA has been stable. Urology recommended discontinuation of prostate cancer screening.

## 2023-01-05 ENCOUNTER — Ambulatory Visit: Payer: Medicare Other | Admitting: Physician Assistant

## 2023-01-18 ENCOUNTER — Other Ambulatory Visit: Payer: Self-pay | Admitting: Family

## 2023-01-27 DIAGNOSIS — Z85820 Personal history of malignant melanoma of skin: Secondary | ICD-10-CM | POA: Diagnosis not present

## 2023-01-27 DIAGNOSIS — D225 Melanocytic nevi of trunk: Secondary | ICD-10-CM | POA: Diagnosis not present

## 2023-01-27 DIAGNOSIS — L821 Other seborrheic keratosis: Secondary | ICD-10-CM | POA: Diagnosis not present

## 2023-01-27 DIAGNOSIS — B351 Tinea unguium: Secondary | ICD-10-CM | POA: Diagnosis not present

## 2023-01-27 DIAGNOSIS — L918 Other hypertrophic disorders of the skin: Secondary | ICD-10-CM | POA: Diagnosis not present

## 2023-01-27 DIAGNOSIS — D2272 Melanocytic nevi of left lower limb, including hip: Secondary | ICD-10-CM | POA: Diagnosis not present

## 2023-01-27 DIAGNOSIS — D1801 Hemangioma of skin and subcutaneous tissue: Secondary | ICD-10-CM | POA: Diagnosis not present

## 2023-02-05 DIAGNOSIS — Z23 Encounter for immunization: Secondary | ICD-10-CM | POA: Diagnosis not present

## 2023-02-19 DIAGNOSIS — B351 Tinea unguium: Secondary | ICD-10-CM | POA: Diagnosis not present

## 2023-02-19 DIAGNOSIS — M79671 Pain in right foot: Secondary | ICD-10-CM | POA: Diagnosis not present

## 2023-02-19 DIAGNOSIS — M79672 Pain in left foot: Secondary | ICD-10-CM | POA: Diagnosis not present

## 2023-03-02 ENCOUNTER — Encounter: Payer: Self-pay | Admitting: Family

## 2023-03-04 ENCOUNTER — Encounter: Payer: Self-pay | Admitting: Family

## 2023-03-11 ENCOUNTER — Encounter: Payer: Self-pay | Admitting: Family

## 2023-03-11 MED ORDER — VIAGRA 50 MG PO TABS: ORAL_TABLET | ORAL | 0 refills | Status: DC

## 2023-03-13 ENCOUNTER — Telehealth: Payer: Self-pay | Admitting: Family

## 2023-03-13 NOTE — Telephone Encounter (Signed)
Copied from CRM [id number]. Topic: Medicare AWV >> Mar 13, 2023 10:34 AM Payton Doughty wrote: Reason for CRM: Called LVM 03/13/2023 to schedule AWV TELEHEALTH ONLY  Verlee Rossetti; Care Guide Ambulatory Clinical Support [facility] Direct Dial: [phone]

## 2023-03-16 ENCOUNTER — Encounter: Payer: Self-pay | Admitting: Family

## 2023-03-16 ENCOUNTER — Other Ambulatory Visit: Payer: Self-pay | Admitting: Family

## 2023-03-16 MED ORDER — SILDENAFIL CITRATE 20 MG PO TABS: ORAL_TABLET | ORAL | 2 refills | Status: AC

## 2023-04-27 ENCOUNTER — Other Ambulatory Visit: Payer: Self-pay | Admitting: Family

## 2023-04-29 DIAGNOSIS — B351 Tinea unguium: Secondary | ICD-10-CM | POA: Diagnosis not present

## 2023-04-29 DIAGNOSIS — M79671 Pain in right foot: Secondary | ICD-10-CM | POA: Diagnosis not present

## 2023-04-29 DIAGNOSIS — M79672 Pain in left foot: Secondary | ICD-10-CM | POA: Diagnosis not present

## 2023-05-14 ENCOUNTER — Encounter: Payer: Self-pay | Admitting: Family

## 2023-05-15 NOTE — Telephone Encounter (Signed)
Form faxed to [employer]

## 2023-06-16 NOTE — Progress Notes (Unsigned)
    Aleen Sells D.[facility] [address] [place] [address] Phone: [phone]   Assessment and Plan:     There are no diagnoses linked to this encounter.  ***   Pertinent previous records reviewed include ***    Follow Up: ***     Subjective:   I, Gerald [name], am serving as a Neurosurgeon for Doctor Richardean Sale  Chief Complaint: shoulder pain   HPI:   06/17/2023 Patient is a 78 year old male with shoulder pain. Patient states   Relevant Historical Information: ***  Additional pertinent review of systems negative.   Current Outpatient Medications:    aspirin EC 81 MG tablet, Take 1 tablet (81 mg total) by mouth daily., Disp: 90 tablet, Rfl: 3   Ciclopirox 1 % shampoo, Apply 1 Application topically daily., Disp: , Rfl: 2   Glycerin-Polysorbate 80 (REFRESH DRY EYE THERAPY OP), Apply 1 drop to eye as needed (dry or itchy eyes)., Disp: , Rfl:    ketoconazole (NIZORAL) 2 % shampoo, Apply 1 application topically as needed for irritation. For eczema, Disp: , Rfl:    losartan (COZAAR) 100 MG tablet, TAKE ONE TABLET BY MOUTH ONE TIME DAILY, Disp: 90 tablet, Rfl: 1   meloxicam (MOBIC) 15 MG tablet, Take 1 tablet (15 mg total) by mouth daily., Disp: 30 tablet, Rfl: 0   metoprolol tartrate (LOPRESSOR) 25 MG tablet, TAKE ONE TABLET BY MOUTH TWICE A DAY, Disp: 180 tablet, Rfl: 1   Multiple Vitamins-Minerals (MULTIVITAMIN ADULTS 50+ PO), Take 1 tablet by mouth daily., Disp: , Rfl:    nitroGLYCERIN (NITROSTAT) 0.4 MG SL tablet, Place 0.4 mg under the tongue every 5 (five) minutes as needed for chest pain., Disp: , Rfl:    Omega-3 Fatty Acids (FISH OIL) 1000 MG CPDR, Take 3,000 mg by mouth 2 (two) times daily., Disp: , Rfl:    omeprazole (PRILOSEC OTC) 20 MG tablet, Take 20 mg by mouth in the morning., Disp: , Rfl:    rosuvastatin (CRESTOR) 10 MG tablet, Take 1 tablet (10 mg total) by mouth daily., Disp: 90 tablet, Rfl: 3   sildenafil (REVATIO)  20 MG tablet, 1-2 tabs prior to sexual activity, Disp: 30 tablet, Rfl: 2   Objective:     There were no vitals filed for this visit.    There is no height or weight on file to calculate BMI.    Physical Exam:    ***   Electronically signed by:  Aleen Sells D.[facility] 7:38 AM 06/16/23

## 2023-06-17 ENCOUNTER — Ambulatory Visit: Admitting: Sports Medicine

## 2023-06-17 ENCOUNTER — Ambulatory Visit (INDEPENDENT_AMBULATORY_CARE_PROVIDER_SITE_OTHER)

## 2023-06-17 VITALS — BP 130/80 | HR 69 | Ht 67.0 in | Wt 187.0 lb

## 2023-06-17 DIAGNOSIS — M25512 Pain in left shoulder: Secondary | ICD-10-CM | POA: Diagnosis not present

## 2023-06-17 NOTE — Patient Instructions (Signed)
 Voltaren gel over areas of pain  Shoulder and thumb HEP  Warm hand baths 3-4 week follow up

## 2023-06-22 ENCOUNTER — Encounter: Payer: Self-pay | Admitting: Sports Medicine

## 2023-07-09 ENCOUNTER — Ambulatory Visit: Admitting: Sports Medicine

## 2023-07-15 DIAGNOSIS — M79671 Pain in right foot: Secondary | ICD-10-CM | POA: Diagnosis not present

## 2023-07-15 DIAGNOSIS — M79672 Pain in left foot: Secondary | ICD-10-CM | POA: Diagnosis not present

## 2023-07-15 DIAGNOSIS — B351 Tinea unguium: Secondary | ICD-10-CM | POA: Diagnosis not present

## 2023-07-20 ENCOUNTER — Encounter: Payer: Self-pay | Admitting: Family

## 2023-07-28 ENCOUNTER — Other Ambulatory Visit: Payer: Self-pay | Admitting: Cardiology

## 2023-07-28 NOTE — Telephone Encounter (Signed)
 Prescription sent to pharmacy.

## 2023-09-10 ENCOUNTER — Other Ambulatory Visit: Payer: Self-pay | Admitting: Family

## 2023-10-01 DIAGNOSIS — M79671 Pain in right foot: Secondary | ICD-10-CM | POA: Diagnosis not present

## 2023-10-01 DIAGNOSIS — B351 Tinea unguium: Secondary | ICD-10-CM | POA: Diagnosis not present

## 2023-10-01 DIAGNOSIS — M79672 Pain in left foot: Secondary | ICD-10-CM | POA: Diagnosis not present

## 2023-10-09 ENCOUNTER — Other Ambulatory Visit: Payer: Self-pay | Admitting: Family

## 2023-10-22 ENCOUNTER — Encounter: Payer: Self-pay | Admitting: Family

## 2023-10-22 ENCOUNTER — Ambulatory Visit (INDEPENDENT_AMBULATORY_CARE_PROVIDER_SITE_OTHER): Admitting: Family

## 2023-10-22 VITALS — BP 112/62 | HR 58 | Temp 97.8°F | Resp 16 | Ht 67.0 in | Wt 190.8 lb

## 2023-10-22 DIAGNOSIS — E782 Mixed hyperlipidemia: Secondary | ICD-10-CM

## 2023-10-22 DIAGNOSIS — K2100 Gastro-esophageal reflux disease with esophagitis, without bleeding: Secondary | ICD-10-CM

## 2023-10-22 DIAGNOSIS — C439 Malignant melanoma of skin, unspecified: Secondary | ICD-10-CM | POA: Diagnosis not present

## 2023-10-22 DIAGNOSIS — R972 Elevated prostate specific antigen [PSA]: Secondary | ICD-10-CM

## 2023-10-22 DIAGNOSIS — I10 Essential (primary) hypertension: Secondary | ICD-10-CM | POA: Diagnosis not present

## 2023-10-22 DIAGNOSIS — Z8659 Personal history of other mental and behavioral disorders: Secondary | ICD-10-CM

## 2023-10-22 MED ORDER — LOSARTAN POTASSIUM 100 MG PO TABS: 100.0000 mg | ORAL_TABLET | Freq: Every day | ORAL | 1 refills | Status: DC

## 2023-10-22 NOTE — Assessment & Plan Note (Signed)
 Continues annual skin checks with dermatology.

## 2023-10-22 NOTE — Patient Instructions (Signed)
 VISIT SUMMARY:  Today, you had a follow-up appointment to review your medications and overall health. Your blood pressure and GERD symptoms are well-controlled with your current medications. You have no current concerns about depression, and your mood is stable.  YOUR PLAN:  GASTROESOPHAGEAL REFLUX DISEASE (GERD): Your GERD symptoms are well-controlled with Prilosec, though certain foods can trigger symptoms. -Continue taking Prilosec daily. -Avoid foods that trigger your symptoms, such as tartar sauce.  HYPERTENSION: Your blood pressure is well-controlled with your current medications. -Continue taking losartan  100 mg daily. -Continue taking metoprolol  25 mg twice daily.  PROSTATE CANCER SURVEILLANCE: You have been discharged from urology follow-up, and your last PSA test was normal. Frequent urination is attributed to age. -Monitor PSA levels as needed. -Contact urology if you notice any changes in your symptoms.  DEPRESSION: You have no current concerns about depression, and your mood is stable.  GENERAL HEALTH MAINTENANCE: You are following up with dermatology for annual skin checks, with no issues reported. -Continue with your annual dermatology skin checks.

## 2023-10-22 NOTE — Assessment & Plan Note (Signed)
 Bp stable on metoprolol  and losartan .  Continue same.   BP Readings from Last 3 Encounters:  10/22/23 112/62  06/17/23 130/80  01/02/23 129/62

## 2023-10-22 NOTE — Assessment & Plan Note (Signed)
Has followed with [facility].  He has had two negative prostate biopsies in the past and PSA has been stable. Urology recommended discontinuation of prostate cancer screening.

## 2023-10-22 NOTE — Assessment & Plan Note (Signed)
 Reports good mood. Not on medication.

## 2023-10-22 NOTE — Assessment & Plan Note (Signed)
Stable on prilosec otc.

## 2023-10-22 NOTE — Progress Notes (Signed)
 Subjective:     Patient ID: Gerald [name], male    DOB: February 16, 1946, 78 y.o.   MRN: [id number]  Chief Complaint  Patient presents with   Hypertension    Here for follow up    Hypertension    Discussed the use of AI scribe software for clinical note transcription with the patient, who gave verbal consent to proceed.  History of Present Illness  Gerald [name] is a 78 year old male who presents for a follow-up on his medications.  He is on losartan  100 mg and metoprolol  25 mg twice daily, with no issues reported. Prilosec is taken over the counter, aiming for daily use, with well-controlled GERD symptoms when taken consistently. Certain foods like tartar sauce can trigger symptoms. He has no current concerns about depression and reports a good mood.  Lab Results  Component Value Date   CHOL 154 01/02/2023   HDL 42.90 01/02/2023   LDLCALC 93 01/02/2023   LDLDIRECT 82.0 12/31/2021   TRIG 92.0 01/02/2023   CHOLHDL 4 01/02/2023        Health Maintenance Due  Topic Date Due   Zoster Vaccines- Shingrix (1 of 2) 09/15/1965   COVID-19 Vaccine (5 - 2024-25 season) 11/30/2022   Medicare Annual Wellness (AWV)  03/20/2023    Past Medical History:  Diagnosis Date   Abrasion of left ear canal 07/08/2022   APC (atrial premature contractions) 10/18/2018   Back pain, chronic    Benign localized prostatic hyperplasia with lower urinary tract symptoms (LUTS)    BPH (benign prostatic hyperplasia) 09/17/2016   COLONIC POLYPS, ADENOMATOUS, HX OF 11/27/2008   Qualifier: Diagnosis of   By: Kowalk CMA (AAMA), Leisha         Depression    Diverticulosis of colon    Elevated PSA    Erectile dysfunction 07/08/2022   Family history of colon cancer 01/14/2011   Fatty liver    Gastroesophageal reflux disease 01/14/2011   Hiatal hernia    History of alcohol abuse    quit 1989   History of esophageal stricture    2003  S/P  DILATATION   History of esophagitis    History  of melanoma in situ    06/ 2017  REMOVAL RIGHT CHEEK AREA   Hx of adenomatous colonic polyps    2003   TUBULAR ADENOMA   Hyperlipidemia    Hypertension    Melanoma of skin (HCC) 09/26/2015   Mild sleep apnea    2004 mild, per sleep study-- no cpap recommended  (pt denies)    Past Surgical History:  Procedure Laterality Date   CARDIOVASCULAR STRESS TEST  04-17-2015  dr burnard   Normal stress nulcear study w/ a small, mild, fixed inferior defect consistent with inferior thinning; no ischemia; normal LV function and wall motion , stress ef 56%   COLONOSCOPY  last one 12-02-2006   CYSTOSCOPY WITH URETHRAL DILATATION N/A 10/27/2017   Procedure: CYSTOSCOPY WITH URETHRAL DILATATION;  Surgeon: Devere Lonni Righter, MD;  Location: [facility];  Service: Urology;  Laterality: N/A;  ONLY NEEDS 90 MIN FOR ALL PROCEDURES   EXCISION LEFT ARM MASS  1988   MELANOMA EXCISION     MOHS SURGERY  09/06/2015   right parotidemomasseteric excision w/ complex repair for melanoma in situ   THULIUM LASER TURP (TRANSURETHRAL RESECTION OF PROSTATE) N/A 07/29/2016   Procedure: JULIE LASER TURP (TRANSURETHRAL RESECTION OF PROSTATE)/ TRANSRECTAL ULTRASOUND GUIDED PROSTATE BIOPSY;  Surgeon: Redell Lynwood Napoleon,  MD;  Location: [facility];  Service: Urology;  Laterality: N/A;   TONSILLECTOMY AND ADENOIDECTOMY  child   TRANSTHORACIC ECHOCARDIOGRAM  04/16/2015   moderate LVH,  ef 60-65%, grade 1 diastolic dysfunction/  trivial AR, MR and PR/  mild dilated ascending aorta/  mild TR   TRANSURETHRAL RESECTION OF PROSTATE N/A 10/27/2017   Procedure: TRANSURETHRAL RESECTION OF THE PROSTATE (TURP);  Surgeon: Devere Lonni Righter, MD;  Location: [facility];  Service: Urology;  Laterality: N/A;   UPPER GASTROINTESTINAL ENDOSCOPY  last one 02-15-2014    Family History  Problem Relation Age of Onset   Melanoma Mother    Uterine cancer Mother    Coronary artery disease Father     Colon cancer Father        ? unknown age dx/died at 18   Coronary artery disease Maternal Grandmother    Colon cancer Maternal Grandmother 74   Heart attack Maternal Grandfather    Coronary artery disease Paternal Grandmother    Heart attack Paternal Grandfather    Hypertension Maternal Aunt    Prostate cancer Neg Hx    Stroke Neg Hx    Esophageal cancer Neg Hx    Pancreatic cancer Neg Hx    Rectal cancer Neg Hx    Stomach cancer Neg Hx     Social History   Socioeconomic History   Marital status: Married    Spouse name: Not on file   Number of children: 2   Years of education: Not on file   Highest education level: Bachelor's degree (e.g., BA, AB, BS)  Occupational History   Occupation: Retired  Tobacco Use   Smoking status: Former    Current packs/day: 0.00    Average packs/day: 2.0 packs/day for 30.0 years (60.0 ttl pk-yrs)    Types: Cigarettes    Start date: 08/28/1976    Quit date: 08/29/2006    Years since quitting: 17.1    Passive exposure: Past   Smokeless tobacco: Never  Vaping Use   Vaping status: Never Used  Substance and Sexual Activity   Alcohol use: No   Drug use: No   Sexual activity: Yes  Other Topics Concern   Not on file  Social History Narrative   Diet: regular, lots of vegetables-----exercise : none but active   Caffeine use:  5 cups coffee daily         Social Drivers of Corporate investment banker Strain: Low Risk  (10/16/2023)   Overall Financial Resource Strain (CARDIA)    Difficulty of Paying Living Expenses: Not hard at all  Food Insecurity: No Food Insecurity (10/16/2023)   Hunger Vital Sign    Worried About Running Out of Food in the Last Year: Never true    Ran Out of Food in the Last Year: Never true  Transportation Needs: No Transportation Needs (10/16/2023)   PRAPARE - Administrator, Civil Service (Medical): No    Lack of Transportation (Non-Medical): No  Physical Activity: Insufficiently Active (10/16/2023)    Exercise Vital Sign    Days of Exercise per Week: 3 days    Minutes of Exercise per Session: 30 min  Stress: No Stress Concern Present (10/16/2023)   [employer] of Occupational Health - Occupational Stress Questionnaire    Feeling of Stress: Not at all  Social Connections: Socially Integrated (10/16/2023)   Social Connection and Isolation Panel    Frequency of Communication with Friends and Family: Three times a week  Frequency of Social Gatherings with Friends and Family: Twice a week    Attends Religious Services: More than 4 times per year    Active Member of [employer] or Organizations: Yes    Attends Engineer, structural: More than 4 times per year    Marital Status: Married  Catering manager Violence: Not At Risk (03/19/2022)   Humiliation, Afraid, Rape, and Kick questionnaire    Fear of Current or Ex-Partner: No    Emotionally Abused: No    Physically Abused: No    Sexually Abused: No    Outpatient Medications Prior to Visit  Medication Sig Dispense Refill   aspirin  EC 81 MG tablet Take 1 tablet (81 mg total) by mouth daily. 90 tablet 3   Ciclopirox 1 % shampoo Apply 1 Application topically daily.  2   Glycerin-Polysorbate 80 (REFRESH DRY EYE THERAPY OP) Apply 1 drop to eye as needed (dry or itchy eyes).     ketoconazole (NIZORAL) 2 % shampoo Apply 1 application topically as needed for irritation. For eczema     meloxicam  (MOBIC ) 15 MG tablet Take 1 tablet (15 mg total) by mouth daily. 30 tablet 0   metoprolol  tartrate (LOPRESSOR ) 25 MG tablet TAKE ONE TABLET BY MOUTH TWICE A DAY **NEED APPOINTMENT** 60 tablet 0   Multiple Vitamins-Minerals (MULTIVITAMIN ADULTS 50+ PO) Take 1 tablet by mouth daily.     nitroGLYCERIN  (NITROSTAT ) 0.4 MG SL tablet PLACE ONE TABLET UNDER THE TONGUE AT ONSET OF CHEST PAIN. MAY REPEAT EVERY 5 MINUTES AS NEEDED FOR CHEST PAIN UP TO 3 TABLETS IN 15 MINUTES. IF NO RELIEF AFTER 5 MINUTES, CALL 911. 25 tablet 6   Omega-3 Fatty Acids (FISH OIL )  1000 MG CPDR Take 3,000 mg by mouth 2 (two) times daily.     omeprazole  (PRILOSEC OTC) 20 MG tablet Take 20 mg by mouth in the morning.     rosuvastatin  (CRESTOR ) 10 MG tablet Take 1 tablet (10 mg total) by mouth daily. 90 tablet 3   sildenafil  (REVATIO ) 20 MG tablet 1-2 tabs prior to sexual activity 30 tablet 2   losartan  (COZAAR ) 100 MG tablet TAKE ONE TABLET BY MOUTH ONE TIME DAILY 90 tablet 1   No facility-administered medications prior to visit.    Allergies  Allergen Reactions   Augmentin [Amoxicillin-Pot Clavulanate] Diarrhea    *Can take plain Amoxicillin* Has patient had a PCN reaction causing immediate rash, facial/tongue/throat swelling, SOB or lightheadedness with hypotension: No Has patient had a PCN reaction causing severe rash involving mucus membranes or skin necrosis: No Has patient had a PCN reaction that required hospitalization No Has patient had a PCN reaction occurring within the last 10 years: Yes If all of the above answers are NO, then may proceed with Cephalosporin use.    Tessalon  [Benzonatate ]     Deep uncontrollable cough   Penicillins Rash    Childhood reaction *Can take Amoxicillin* Has patient had a PCN reaction causing immediate rash, facial/tongue/throat swelling, SOB or lightheadedness with hypotension: Unknown Has patient had a PCN reaction causing severe rash involving mucus membranes or skin necrosis: Unknown Has patient had a PCN reaction that required hospitalization: Unknown Has patient had a PCN reaction occurring within the last 10 years: Unknown  If all of the above answers are NO, then may proceed with Cephalosporin use    ROS See HPI    Objective:    Physical Exam Constitutional:      General: He is not in acute distress.  Appearance: He is well-developed.  HENT:     Head: Normocephalic and atraumatic.  Cardiovascular:     Rate and Rhythm: Normal rate and regular rhythm.     Heart sounds: No murmur heard. Pulmonary:      Effort: Pulmonary effort is normal. No respiratory distress.     Breath sounds: Normal breath sounds. No wheezing or rales.  Skin:    General: Skin is warm and dry.  Neurological:     Mental Status: He is alert and oriented to person, place, and time.  Psychiatric:        Behavior: Behavior normal.        Thought Content: Thought content normal.      BP 112/62 (BP Location: Right Arm, Patient Position: Sitting, Cuff Size: Normal)   Pulse (!) 58   Temp 97.8 F (36.6 C) (Oral)   Resp 16   Ht 5' 7 (1.702 m)   Wt 190 lb 12.8 oz (86.5 kg)   SpO2 96%   BMI 29.88 kg/m  Wt Readings from Last 3 Encounters:  10/22/23 190 lb 12.8 oz (86.5 kg)  06/17/23 187 lb (84.8 kg)  01/02/23 187 lb (84.8 kg)       Assessment & Plan:   Problem List Items Addressed This Visit       Unprioritized   Melanoma of skin (HCC)   Continues annual skin checks with dermatology.       Hypertension - Primary   Bp stable on metoprolol  and losartan .  Continue same.   BP Readings from Last 3 Encounters:  10/22/23 112/62  06/17/23 130/80  01/02/23 129/62         Relevant Medications   losartan  (COZAAR ) 100 MG tablet   Other Relevant Orders   Comp Met (CMET)   Hyperlipidemia   Continues crestor . Update lipid panel.       Relevant Medications   losartan  (COZAAR ) 100 MG tablet   Other Relevant Orders   Lipid panel   History of depression   Reports good mood. Not on medication.        Gastroesophageal reflux disease   Stable on prilosec otc.        Elevated PSA   Has followed with [facility]. He has had two negative prostate biopsies in the past and PSA has been stable. Urology recommended discontinuation of prostate cancer screening.        I have changed Morene KIDD. Skare Ben's losartan . I am also having him maintain his ketoconazole, Multiple Vitamins-Minerals (MULTIVITAMIN ADULTS 50+ PO), Ciclopirox, Glycerin-Polysorbate 80 (REFRESH DRY EYE THERAPY OP), aspirin  EC,  omeprazole , Fish Oil , meloxicam , rosuvastatin , sildenafil , nitroGLYCERIN , and metoprolol  tartrate.  Meds ordered this encounter  Medications   losartan  (COZAAR ) 100 MG tablet    Sig: Take 1 tablet (100 mg total) by mouth daily.    Dispense:  90 tablet    Refill:  1

## 2023-10-22 NOTE — Assessment & Plan Note (Signed)
 Continues crestor . Update lipid panel.

## 2023-10-23 ENCOUNTER — Ambulatory Visit: Payer: Self-pay | Admitting: Family

## 2023-10-23 LAB — COMPREHENSIVE METABOLIC PANEL WITH GFR
ALT: 29 U/L (ref 0–53)
AST: 23 U/L (ref 0–37)
Albumin: 4.3 g/dL (ref 3.5–5.2)
Alkaline Phosphatase: 74 U/L (ref 39–117)
BUN: 25 mg/dL — ABNORMAL HIGH (ref 6–23)
CO2: 26 meq/L (ref 19–32)
Calcium: 9.1 mg/dL (ref 8.4–10.5)
Chloride: 105 meq/L (ref 96–112)
Creatinine, Ser: 1.01 mg/dL (ref 0.40–1.50)
GFR: 71.94 mL/min (ref >60.00)
Glucose, Bld: 84 mg/dL (ref 70–99)
Potassium: 4.2 meq/L (ref 3.5–5.1)
Sodium: 140 meq/L (ref 135–145)
Total Bilirubin: 0.7 mg/dL (ref 0.2–1.2)
Total Protein: 6.3 g/dL (ref 6.0–8.3)

## 2023-10-23 LAB — LIPID PANEL
Cholesterol: 143 mg/dL (ref 0–200)
HDL: 36.5 mg/dL — ABNORMAL LOW (ref >39.00)
LDL Cholesterol: 55 mg/dL (ref 0–99)
NonHDL: 106.23
Total CHOL/HDL Ratio: 4
Triglycerides: 256 mg/dL — ABNORMAL HIGH (ref 0.0–149.0)
VLDL: 51.2 mg/dL — ABNORMAL HIGH (ref 0.0–40.0)

## 2023-11-04 ENCOUNTER — Other Ambulatory Visit: Payer: Self-pay | Admitting: Family

## 2023-11-27 ENCOUNTER — Encounter: Payer: Self-pay | Admitting: Family

## 2023-12-03 DIAGNOSIS — B351 Tinea unguium: Secondary | ICD-10-CM | POA: Diagnosis not present

## 2023-12-03 DIAGNOSIS — M79672 Pain in left foot: Secondary | ICD-10-CM | POA: Diagnosis not present

## 2023-12-03 DIAGNOSIS — M79671 Pain in right foot: Secondary | ICD-10-CM | POA: Diagnosis not present

## 2023-12-10 NOTE — Progress Notes (Unsigned)
 [name] [address] [place] [address] Phone: [phone]   Assessment and Plan:     1. Chronic left shoulder pain (Primary) -Chronic with exacerbation, subsequent visit - Recurrence of left shoulder pain radiating into left arm most consistent with rotator cuff pathology versus subacromial bursitis versus flare of osteoarthritis - Patient had significant relief after subacromial CSI on 06/17/2023.  Could consider repeat CSI in the future - Start meloxicam  15 mg daily x3 weeks.  Do not to use additional over-the-counter NSAIDs (ibuprofen, naproxen, Advil, Aleve, etc.) while taking prescription NSAIDs.  May use Tylenol  [phone] mg 2 to 3 times a day for breakthrough pain. - Restart HEP  2. Chronic thumb pain, bilateral -Chronic with exacerbation, initial visit - Most consistent with flare of first MCP osteoarthritis - Will obtain x-rays at today's visit for further evaluation - Start meloxicam  15 mg daily x 3 weeks.  Do not to use additional over-the-counter NSAIDs (ibuprofen, naproxen, Advil, Aleve, etc.) while taking prescription NSAIDs.  May use Tylenol  [phone] mg 2 to 3 times a day for breakthrough pain.  -May use topical Voltaren gel, warm hand baths, thumb braces as needed  Pertinent previous records reviewed include none   Follow Up: 4 weeks for reevaluation.  Would review and x-rays.  Could consider CSI to first MCP joints versus left subacromial space based on areas of remaining pain   Subjective:   I, Gerald [name], am serving as a Neurosurgeon for Doctor [name]   Chief Complaint: shoulder pain    HPI:    06/17/2023 Patient is a 78 year old male with shoulder pain. Patient states intermittent pain. Radiates down his arms. Aleve and tylenol  helps a little. Decreased ROM . Pain for about 2 weeks. No MOI.    12/11/2023 Patient states pain came back a month ago. Bilat thumb pain as well locking   Relevant  Historical Information: Hypertension, GERD  Additional pertinent review of systems negative.   Current Outpatient Medications:    aspirin  EC 81 MG tablet, Take 1 tablet (81 mg total) by mouth daily., Disp: 90 tablet, Rfl: 3   Ciclopirox 1 % shampoo, Apply 1 Application topically daily., Disp: , Rfl: 2   Glycerin-Polysorbate 80 (REFRESH DRY EYE THERAPY OP), Apply 1 drop to eye as needed (dry or itchy eyes)., Disp: , Rfl:    ketoconazole (NIZORAL) 2 % shampoo, Apply 1 application topically as needed for irritation. For eczema, Disp: , Rfl:    losartan  (COZAAR ) 100 MG tablet, Take 1 tablet (100 mg total) by mouth daily., Disp: 90 tablet, Rfl: 1   meloxicam  (MOBIC ) 15 MG tablet, Take 1 tablet (15 mg total) by mouth daily., Disp: 21 tablet, Rfl: 0   metoprolol  tartrate (LOPRESSOR ) 25 MG tablet, Take 1 tablet (25 mg total) by mouth 2 (two) times daily., Disp: 180 tablet, Rfl: 1   Multiple Vitamins-Minerals (MULTIVITAMIN ADULTS 50+ PO), Take 1 tablet by mouth daily., Disp: , Rfl:    nitroGLYCERIN  (NITROSTAT ) 0.4 MG SL tablet, PLACE ONE TABLET UNDER THE TONGUE AT ONSET OF CHEST PAIN. MAY REPEAT EVERY 5 MINUTES AS NEEDED FOR CHEST PAIN UP TO 3 TABLETS IN 15 MINUTES. IF NO RELIEF AFTER 5 MINUTES, CALL 911., Disp: 25 tablet, Rfl: 6   Omega-3 Fatty Acids (FISH OIL ) 1000 MG CPDR, Take 3,000 mg by mouth 2 (two) times daily., Disp: , Rfl:    omeprazole  (PRILOSEC OTC) 20 MG tablet, Take 20 mg by mouth in  the morning., Disp: , Rfl:    rosuvastatin  (CRESTOR ) 10 MG tablet, Take 1 tablet (10 mg total) by mouth daily., Disp: 90 tablet, Rfl: 3   sildenafil  (REVATIO ) 20 MG tablet, 1-2 tabs prior to sexual activity, Disp: 30 tablet, Rfl: 2   Objective:     Vitals:   12/11/23 1404  Pulse: 84  SpO2: 96%  Weight: 188 lb (85.3 kg)  Height: 5' 7 (1.702 m)      Body mass index is 29.44 kg/m.    Physical Exam:    Gen: Appears well, nad, nontoxic and pleasant Neuro:sensation intact, strength is 5/5 with  df/pf/inv/ev, muscle tone wnl Skin: no suspicious lesion or defmority Psych: A&O, appropriate mood and affect   Left shoulder:  No deformity, swelling or muscle wasting No scapular winging FF 180, abd 170 with painful arc, int 10, ext 90 NTTP over the [place], clavicle, ac, coracoid, biceps groove, humerus, deltoid, trapezius, cervical spine Positive neer, hawkins, empty can, obriens, Negative crossarm, subscap liftoff, speeds Neg ant drawer, sulcus sign, apprehension Negative Spurling's test bilat FROM of neck   Bilateral hands: Hypertrophy of first MCP joints with TTP No gross deformity, erythema, ecchymosis, open lesion, warmth Bilateral thumb motion without triggering NTTP IP, CMC joints Negative Terrilee  Electronically signed by:  [name] [name] 2:22 PM 12/11/23

## 2023-12-11 ENCOUNTER — Ambulatory Visit: Admitting: Sports Medicine

## 2023-12-11 ENCOUNTER — Other Ambulatory Visit: Payer: Self-pay | Admitting: Sports Medicine

## 2023-12-11 ENCOUNTER — Ambulatory Visit

## 2023-12-11 VITALS — HR 84 | Ht 67.0 in | Wt 188.0 lb

## 2023-12-11 DIAGNOSIS — S63002A Unspecified subluxation of left wrist and hand, initial encounter: Secondary | ICD-10-CM | POA: Diagnosis not present

## 2023-12-11 DIAGNOSIS — M79644 Pain in right finger(s): Secondary | ICD-10-CM

## 2023-12-11 DIAGNOSIS — M180 Bilateral primary osteoarthritis of first carpometacarpal joints: Secondary | ICD-10-CM | POA: Diagnosis not present

## 2023-12-11 DIAGNOSIS — M79645 Pain in left finger(s): Secondary | ICD-10-CM

## 2023-12-11 DIAGNOSIS — G8929 Other chronic pain: Secondary | ICD-10-CM

## 2023-12-11 DIAGNOSIS — S63112A Subluxation of metacarpophalangeal joint of left thumb, initial encounter: Secondary | ICD-10-CM | POA: Diagnosis not present

## 2023-12-11 DIAGNOSIS — M19042 Primary osteoarthritis, left hand: Secondary | ICD-10-CM | POA: Diagnosis not present

## 2023-12-11 DIAGNOSIS — M25512 Pain in left shoulder: Secondary | ICD-10-CM

## 2023-12-11 MED ORDER — MELOXICAM 15 MG PO TABS: 15.0000 mg | ORAL_TABLET | Freq: Every day | ORAL | 0 refills | Status: DC

## 2023-12-11 NOTE — Patient Instructions (Addendum)
-   Start meloxicam  15 mg daily x3 weeks. May use remaining NSAID as needed once daily for pain control.  Do not to use additional over-the-counter NSAIDs (ibuprofen, naproxen, Advil, Aleve, etc.) while taking prescription NSAIDs.  May use Tylenol  [phone] mg 2 to 3 times a day for breakthrough pain.  Xrays on the way out    4 week follow up

## 2023-12-21 ENCOUNTER — Ambulatory Visit: Payer: Self-pay | Admitting: Sports Medicine

## 2024-01-07 ENCOUNTER — Ambulatory Visit: Admitting: Sports Medicine

## 2024-01-09 ENCOUNTER — Other Ambulatory Visit: Payer: Self-pay | Admitting: Family

## 2024-01-09 DIAGNOSIS — I10 Essential (primary) hypertension: Secondary | ICD-10-CM

## 2024-01-12 NOTE — Progress Notes (Unsigned)
 [name] [address] [place] [address] Phone: [phone]   Assessment and Plan:     1. Chronic left shoulder pain (Primary) -Chronic with exacerbation, subsequent visit - Overall significant improvement in left shoulder pain after completing meloxicam  course, continuing HEP - Continue HEP - Use meloxicam  15 mg daily as needed for breakthrough pain.  Recommend limiting chronic NSAIDs to 1-2 doses per week to prevent long-term side effects. Use Tylenol  500 to 1000 mg tablets 2-3 times a day as needed for day-to-day pain relief.     2. Chronic thumb pain, bilateral 3. Osteoarthritis of thumbs, bilateral -Chronic with exacerbation, subsequent visit - Continued bilateral thumb pain with moderate improvement after completing meloxicam  course.  Consistent with severe osteoarthritis of right MCP joint and moderate osteoarthritis of left CMC joint  - Use meloxicam  15 mg daily as needed for breakthrough pain.  Recommend limiting chronic NSAIDs to 1-2 doses per week to prevent long-term side effects. Use Tylenol  500 to 1000 mg tablets 2-3 times a day as needed for day-to-day pain relief.    - Recommend using topical Voltaren gel over areas of pain - Recommend using warm hand baths  Pertinent previous records reviewed include left and right hand x-ray 12/11/2023   Follow Up: As needed if no improvement or worsening of symptoms.  Could consider CSI to left subacromial region, right MCP or left CMC   Subjective:   I, Gerald [name] am a scribe for Dr. Leonce.    Chief Complaint: shoulder pain    HPI:    06/17/2023 Patient is a 78 year old male with shoulder pain. Patient states intermittent pain. Radiates down his arms. Aleve and tylenol  helps a little. Decreased ROM . Pain for about 2 weeks. No MOI.    12/11/2023 Patient states pain came back a month ago. Bilat thumb pain as well locking   01/13/2024 Patient states is about  90% on the arm and 50% better on the thumbs.    Relevant Historical Information: Hypertension, GERD  Additional pertinent review of systems negative.   Current Outpatient Medications:    aspirin  EC 81 MG tablet, Take 1 tablet (81 mg total) by mouth daily., Disp: 90 tablet, Rfl: 3   Ciclopirox 1 % shampoo, Apply 1 Application topically daily., Disp: , Rfl: 2   Glycerin-Polysorbate 80 (REFRESH DRY EYE THERAPY OP), Apply 1 drop to eye as needed (dry or itchy eyes)., Disp: , Rfl:    ketoconazole (NIZORAL) 2 % shampoo, Apply 1 application topically as needed for irritation. For eczema, Disp: , Rfl:    losartan  (COZAAR ) 100 MG tablet, TAKE ONE TABLET BY MOUTH ONE TIME DAILY, Disp: 90 tablet, Rfl: 1   metoprolol  tartrate (LOPRESSOR ) 25 MG tablet, Take 1 tablet (25 mg total) by mouth 2 (two) times daily., Disp: 180 tablet, Rfl: 1   Multiple Vitamins-Minerals (MULTIVITAMIN ADULTS 50+ PO), Take 1 tablet by mouth daily., Disp: , Rfl:    nitroGLYCERIN  (NITROSTAT ) 0.4 MG SL tablet, PLACE ONE TABLET UNDER THE TONGUE AT ONSET OF CHEST PAIN. MAY REPEAT EVERY 5 MINUTES AS NEEDED FOR CHEST PAIN UP TO 3 TABLETS IN 15 MINUTES. IF NO RELIEF AFTER 5 MINUTES, CALL 911., Disp: 25 tablet, Rfl: 6   Omega-3 Fatty Acids (FISH OIL ) 1000 MG CPDR, Take 3,000 mg by mouth 2 (two) times daily., Disp: , Rfl:    omeprazole  (PRILOSEC OTC) 20 MG tablet, Take 20 mg by mouth in the morning., Disp: ,  Rfl:    rosuvastatin  (CRESTOR ) 10 MG tablet, Take 1 tablet (10 mg total) by mouth daily., Disp: 90 tablet, Rfl: 3   sildenafil  (REVATIO ) 20 MG tablet, 1-2 tabs prior to sexual activity, Disp: 30 tablet, Rfl: 2   meloxicam  (MOBIC ) 15 MG tablet, Take 1 tablet (15 mg total) by mouth as needed for pain., Disp: 30 tablet, Rfl: 0   Objective:     Vitals:   01/13/24 1041  BP: (!) 140/70  Pulse: 67  SpO2: 96%  Weight: 190 lb (86.2 kg)  Height: 5' 7 (1.702 m)      Body mass index is 29.76 kg/m.    Physical Exam:    Gen: Appears  well, nad, nontoxic and pleasant Neuro:sensation intact, strength is 5/5 with df/pf/inv/ev, muscle tone wnl Skin: no suspicious lesion or defmority Psych: A&O, appropriate mood and affect   Left shoulder:  No deformity, swelling or muscle wasting No scapular winging FF 180, abd 180, int 10, ext 90 NTTP over the [place], clavicle, ac, coracoid, biceps groove, humerus, deltoid, trapezius, cervical spine negative neer, hawkins, empty can, obriens, Negative crossarm, subscap liftoff, speeds Neg ant drawer, sulcus sign, apprehension Negative Spurling's test bilat FROM of neck    Bilateral hands: Hypertrophy of first MCP joints with TTP No gross deformity, erythema, ecchymosis, open lesion, warmth Bilateral thumb motion without triggering NTTP IP, CMC joints Negative Terrilee  Electronically signed by:  [name] [name] 11:13 AM 01/13/24

## 2024-01-13 ENCOUNTER — Ambulatory Visit: Admitting: Sports Medicine

## 2024-01-13 ENCOUNTER — Ambulatory Visit (INDEPENDENT_AMBULATORY_CARE_PROVIDER_SITE_OTHER): Admitting: Sports Medicine

## 2024-01-13 VITALS — BP 140/70 | HR 67 | Ht 67.0 in | Wt 190.0 lb

## 2024-01-13 DIAGNOSIS — M79645 Pain in left finger(s): Secondary | ICD-10-CM

## 2024-01-13 DIAGNOSIS — M25512 Pain in left shoulder: Secondary | ICD-10-CM | POA: Diagnosis not present

## 2024-01-13 DIAGNOSIS — M180 Bilateral primary osteoarthritis of first carpometacarpal joints: Secondary | ICD-10-CM

## 2024-01-13 DIAGNOSIS — M79644 Pain in right finger(s): Secondary | ICD-10-CM | POA: Diagnosis not present

## 2024-01-13 DIAGNOSIS — G8929 Other chronic pain: Secondary | ICD-10-CM | POA: Diagnosis not present

## 2024-01-13 MED ORDER — MELOXICAM 15 MG PO TABS: 15.0000 mg | ORAL_TABLET | ORAL | 0 refills | Status: AC | PRN

## 2024-01-13 NOTE — Patient Instructions (Signed)
 Topical Voltaren gel for thumbs.  - Use meloxicam  15 mg daily as needed for breakthrough pain.  Recommend limiting chronic NSAIDs to 1-2 doses per week to prevent long-term side effects. Use Tylenol  500 to 1000 mg tablets 2-3 times a day as needed for day-to-day pain relief.    Use warm hand baths. Follow up as needed.

## 2024-01-14 ENCOUNTER — Ambulatory Visit: Admitting: Sports Medicine

## 2024-01-18 DIAGNOSIS — D1801 Hemangioma of skin and subcutaneous tissue: Secondary | ICD-10-CM | POA: Diagnosis not present

## 2024-01-18 DIAGNOSIS — L814 Other melanin hyperpigmentation: Secondary | ICD-10-CM | POA: Diagnosis not present

## 2024-01-18 DIAGNOSIS — I788 Other diseases of capillaries: Secondary | ICD-10-CM | POA: Diagnosis not present

## 2024-01-18 DIAGNOSIS — L918 Other hypertrophic disorders of the skin: Secondary | ICD-10-CM | POA: Diagnosis not present

## 2024-01-18 DIAGNOSIS — L821 Other seborrheic keratosis: Secondary | ICD-10-CM | POA: Diagnosis not present

## 2024-01-18 DIAGNOSIS — Z85820 Personal history of malignant melanoma of skin: Secondary | ICD-10-CM | POA: Diagnosis not present

## 2024-02-04 DIAGNOSIS — M79671 Pain in right foot: Secondary | ICD-10-CM | POA: Diagnosis not present

## 2024-02-04 DIAGNOSIS — B351 Tinea unguium: Secondary | ICD-10-CM | POA: Diagnosis not present

## 2024-02-04 DIAGNOSIS — L84 Corns and callosities: Secondary | ICD-10-CM | POA: Diagnosis not present

## 2024-02-04 DIAGNOSIS — M79672 Pain in left foot: Secondary | ICD-10-CM | POA: Diagnosis not present

## 2024-02-21 ENCOUNTER — Encounter: Payer: Self-pay | Admitting: Family

## 2024-02-22 NOTE — Telephone Encounter (Signed)
 Message sent to Melissa via patients chart.

## 2024-02-27 ENCOUNTER — Ambulatory Visit
Admission: EM | Admit: 2024-02-27 | Discharge: 2024-02-27 | Disposition: A | Discharge status: Home / Self Care | Attending: Family Medicine | Admitting: Family Medicine

## 2024-02-27 ENCOUNTER — Other Ambulatory Visit: Payer: Self-pay

## 2024-02-27 DIAGNOSIS — J069 Acute upper respiratory infection, unspecified: Secondary | ICD-10-CM

## 2024-02-27 DIAGNOSIS — R051 Acute cough: Secondary | ICD-10-CM

## 2024-02-27 LAB — POC COVID19/FLU A&B COMBO
Covid Antigen, POC: NEGATIVE
Influenza A Antigen, POC: NEGATIVE
Influenza B Antigen, POC: NEGATIVE

## 2024-02-27 NOTE — ED Triage Notes (Signed)
 Pt int dry cough and int sneezex2d.

## 2024-02-27 NOTE — ED Provider Notes (Signed)
 [facility]    CSN: [id number] Arrival date & time: 02/27/24  1122      History   Chief Complaint No chief complaint on file.   HPI Gerald [name] is a 78 y.o. male  presents for evaluation of URI symptoms for 2 days. Patient reports associated symptoms of cough, congestion and sneezing. Denies N/V/D, fevers, sore throat, ear pain, body aches, shortness of breath. Patient does not have a hx of asthma. Patient is not an active smoker.   Reports sick contacts via son and wife.  Pt has taken nothing OTC for symptoms. Pt has no other concerns at this time.     HPI  Past Medical History:  Diagnosis Date   Abrasion of left ear canal 07/08/2022   APC (atrial premature contractions) 10/18/2018   Back pain, chronic    Benign localized prostatic hyperplasia with lower urinary tract symptoms (LUTS)    BPH (benign prostatic hyperplasia) 09/17/2016   COLONIC POLYPS, ADENOMATOUS, HX OF 11/27/2008   Qualifier: Diagnosis of   By: Kowalk CMA (AAMA), Leisha         Depression    Diverticulosis of colon    Elevated PSA    Erectile dysfunction 07/08/2022   Family history of colon cancer 01/14/2011   Fatty liver    Gastroesophageal reflux disease 01/14/2011   Hiatal hernia    History of alcohol abuse    quit 1989   History of esophageal stricture    2003  S/P  DILATATION   History of esophagitis    History of melanoma in situ    06/ 2017  REMOVAL RIGHT CHEEK AREA   Hx of adenomatous colonic polyps    2003   TUBULAR ADENOMA   Hyperlipidemia    Hypertension    Melanoma of skin (HCC) 09/26/2015   Mild sleep apnea    2004 mild, per sleep study-- no cpap recommended  (pt denies)    Patient Active Problem List   Diagnosis Date Noted   Coronary artery disease involving native coronary artery of native heart without angina pectoris 11/18/2022   Erectile dysfunction 07/08/2022   Mild sleep apnea    Hypertension    Hx of adenomatous colonic polyps    History of  esophagitis    History of esophageal stricture    History of alcohol abuse    Hiatal hernia    Fatty liver    Diverticulosis of colon    History of depression    APC (atrial premature contractions) 10/18/2018   Benign localized prostatic hyperplasia with lower urinary tract symptoms (LUTS) 09/17/2016   Melanoma of skin (HCC) 09/26/2015   Elevated PSA 05/07/2011   Gastroesophageal reflux disease 01/14/2011   Family history of colon cancer 01/14/2011   History of colonic polyps 11/27/2008   Hyperlipidemia 08/10/2006    Past Surgical History:  Procedure Laterality Date   CARDIOVASCULAR STRESS TEST  04-17-2015  dr burnard   Normal stress nulcear study w/ a small, mild, fixed inferior defect consistent with inferior thinning; no ischemia; normal LV function and wall motion , stress ef 56%   COLONOSCOPY  last one 12-02-2006   CYSTOSCOPY WITH URETHRAL DILATATION N/A 10/27/2017   Procedure: CYSTOSCOPY WITH URETHRAL DILATATION;  Surgeon: Devere Lonni Righter, MD;  Location: [facility];  Service: Urology;  Laterality: N/A;  ONLY NEEDS 90 MIN FOR ALL PROCEDURES   EXCISION LEFT ARM MASS  1988   MELANOMA EXCISION     MOHS SURGERY  09/06/2015  right parotidemomasseteric excision w/ complex repair for melanoma in situ   THULIUM LASER TURP (TRANSURETHRAL RESECTION OF PROSTATE) N/A 07/29/2016   Procedure: JULIE LASER TURP (TRANSURETHRAL RESECTION OF PROSTATE)/ TRANSRECTAL ULTRASOUND GUIDED PROSTATE BIOPSY;  Surgeon: Redell Lynwood Napoleon, MD;  Location[facility];  Service: Urology;  Laterality: N/A;   TONSILLECTOMY AND ADENOIDECTOMY  child   TRANSTHORACIC ECHOCARDIOGRAM  04/16/2015   moderate LVH,  ef 60-65%, grade 1 diastolic dysfunction/  trivial AR, MR and PR/  mild dilated ascending aorta/  mild TR   TRANSURETHRAL RESECTION OF PROSTATE N/A 10/27/2017   Procedure: TRANSURETHRAL RESECTION OF THE PROSTATE (TURP);  Surgeon: Devere Lonni Righter, MD;  Location:  [facility];  Service: Urology;  Laterality: N/A;   UPPER GASTROINTESTINAL ENDOSCOPY  last one 02-15-2014       Home Medications    Prior to Admission medications   Medication Sig Start Date End Date Taking? Authorizing Provider  aspirin  EC 81 MG tablet Take 1 tablet (81 mg total) by mouth daily. 12/07/18   Monetta Redell PARAS, MD  Ciclopirox 1 % shampoo Apply 1 Application topically daily. 09/03/17   [provider]  Glycerin-Polysorbate 80 (REFRESH DRY EYE THERAPY OP) Apply 1 drop to eye as needed (dry or itchy eyes).    [provider]  ketoconazole (NIZORAL) 2 % shampoo Apply 1 application topically as needed for irritation. For eczema 11/01/11   [provider]  losartan  (COZAAR ) 100 MG tablet TAKE ONE TABLET BY MOUTH ONE TIME DAILY 01/11/24   O'Sullivan, Melissa, NP  meloxicam  (MOBIC ) 15 MG tablet Take 1 tablet (15 mg total) by mouth as needed for pain. 01/13/24   Leonce Katz, DO  metoprolol  tartrate (LOPRESSOR ) 25 MG tablet Take 1 tablet (25 mg total) by mouth 2 (two) times daily. 11/04/23   O'Sullivan, Melissa, NP  Multiple Vitamins-Minerals (MULTIVITAMIN ADULTS 50+ PO) Take 1 tablet by mouth daily.    [provider]  nitroGLYCERIN  (NITROSTAT ) 0.4 MG SL tablet PLACE ONE TABLET UNDER THE TONGUE AT ONSET OF CHEST PAIN. MAY REPEAT EVERY 5 MINUTES AS NEEDED FOR CHEST PAIN UP TO 3 TABLETS IN 15 MINUTES. IF NO RELIEF AFTER 5 MINUTES, CALL 911. 07/28/23   Madireddy, Alean SAUNDERS, MD  Omega-3 Fatty Acids (FISH OIL ) 1000 MG CPDR Take 3,000 mg by mouth 2 (two) times daily. 01/02/22   O'Sullivan, Melissa, NP  omeprazole  (PRILOSEC OTC) 20 MG tablet Take 20 mg by mouth in the morning.    [provider]  rosuvastatin  (CRESTOR ) 10 MG tablet Take 1 tablet (10 mg total) by mouth daily. 11/18/22 01/13/24  MadireddyAlean SAUNDERS, MD  sildenafil  (REVATIO ) 20 MG tablet 1-2 tabs prior to sexual activity 03/16/23   Daryl Setter, NP    Family  History Family History  Problem Relation Age of Onset   Melanoma Mother    Uterine cancer Mother    Coronary artery disease Father    Colon cancer Father        ? unknown age dx/died at 67   Coronary artery disease Maternal Grandmother    Colon cancer Maternal Grandmother 74   Heart attack Maternal Grandfather    Coronary artery disease Paternal Grandmother    Heart attack Paternal Grandfather    Hypertension Maternal Aunt    Prostate cancer Neg Hx    Stroke Neg Hx    Esophageal cancer Neg Hx    Pancreatic cancer Neg Hx    Rectal cancer Neg Hx  Stomach cancer Neg Hx     Social History Social History   Tobacco Use   Smoking status: Former    Current packs/day: 0.00    Average packs/day: 2.0 packs/day for 30.0 years (60.0 ttl pk-yrs)    Types: Cigarettes    Start date: 08/28/1976    Quit date: 08/29/2006    Years since quitting: 17.5    Passive exposure: Past   Smokeless tobacco: Never  Vaping Use   Vaping status: Never Used  Substance Use Topics   Alcohol use: No   Drug use: No     Allergies   Augmentin [amoxicillin-pot clavulanate], Tessalon  [benzonatate ], and Penicillins   Review of Systems Review of Systems  HENT:  Positive for congestion and sneezing.   Respiratory:  Positive for cough.      Physical Exam Triage Vital Signs ED Triage Vitals  Encounter Vitals Group     BP 02/27/24 1143 137/72     Girls Systolic BP Percentile --      Girls Diastolic BP Percentile --      Boys Systolic BP Percentile --      Boys Diastolic BP Percentile --      Pulse Rate 02/27/24 1143 (!) 54     Resp 02/27/24 1143 18     Temp 02/27/24 1143 98.1 F (36.7 C)     Temp Source 02/27/24 1143 Oral     SpO2 02/27/24 1143 96 %     Weight --      Height --      Head Circumference --      Peak Flow --      Pain Score 02/27/24 1142 0     Pain Loc --      Pain Education --      Exclude from Growth Chart --    No data found.  Updated Vital Signs BP 137/72   Pulse  (!) 54   Temp 98.1 F (36.7 C) (Oral)   Resp 18   SpO2 96%   Visual Acuity Right Eye Distance:   Left Eye Distance:   Bilateral Distance:    Right Eye Near:   Left Eye Near:    Bilateral Near:     Physical Exam Vitals and nursing note reviewed.  Constitutional:      General: He is not in acute distress.    Appearance: Normal appearance. He is not ill-appearing or toxic-appearing.  HENT:     Head: Normocephalic and atraumatic.     Right Ear: Tympanic membrane and ear canal normal.     Left Ear: Tympanic membrane and ear canal normal.     Nose: Congestion present.     Mouth/Throat:     Mouth: Mucous membranes are moist.     Pharynx: No posterior oropharyngeal erythema.  Eyes:     Pupils: Pupils are equal, round, and reactive to light.  Cardiovascular:     Rate and Rhythm: Regular rhythm. Bradycardia present.     Heart sounds: Normal heart sounds.     Comments: Heart rate 54 patient on beta-blocker Pulmonary:     Effort: Pulmonary effort is normal.     Breath sounds: Normal breath sounds. No wheezing or rhonchi.  Musculoskeletal:     Cervical back: Normal range of motion and neck supple.  Lymphadenopathy:     Cervical: No cervical adenopathy.  Skin:    General: Skin is warm and dry.  Neurological:     General: No focal deficit present.  Mental Status: He is alert and oriented to person, place, and time.  Psychiatric:        Mood and Affect: Mood normal.        Behavior: Behavior normal.      UC Treatments / Results  Labs (all labs ordered are listed, but only abnormal results are displayed) Labs Reviewed  POC COVID19/FLU A&B COMBO    EKG   Radiology No results found.  Procedures Procedures (including critical care time)  Medications Ordered in UC Medications - No data to display  Initial Impression / Assessment and Plan / UC Course  I have reviewed the triage vital signs and the nursing notes.  Pertinent labs & imaging results that were  available during my care of the patient were reviewed by me and considered in my medical decision making (see chart for details).     Reviewed exam and symptoms with patient.  No red flags.  Negative COVID and flu testing.  Discussed viral illness and symptomatic treatment.  PCP follow-up if symptoms do not improve.  ER precautions reviewed. Final Clinical Impressions(s) / UC Diagnoses   Final diagnoses:  Acute cough  Viral upper respiratory illness     Discharge Instructions      You tested negative for COVID and flu. Please treat your symptoms with over the counter cough medication, tylenol  or ibuprofen, humidifier, and rest. Viral illnesses can last 7-14 days. Please follow up with your PCP if your symptoms are not improving. Please go to the ER for any worsening symptoms. This includes but is not limited to fever you can not control with tylenol  or ibuprofen, you are not able to stay hydrated, you have shortness of breath or chest pain.  Thank you for choosing [facility] for your healthcare needs. I hope you feel better soon!      ED Prescriptions   None    PDMP not reviewed this encounter.   Loreda Myla SAUNDERS, NP 02/27/24 [phone]

## 2024-02-27 NOTE — Discharge Instructions (Signed)
 You tested negative for COVID and flu.  Please treat your symptoms with over the counter cough medication, tylenol  or ibuprofen , humidifier, and rest. Viral illnesses can last 7-14 days. Please follow up with your PCP if your symptoms are not improving. Please go to the ER for any worsening symptoms. This includes but is not limited to fever you can not control with tylenol  or ibuprofen , you are not able to stay hydrated, you have shortness of breath or chest pain.  Thank you for choosing [facility] for your healthcare needs. I hope you feel better soon!

## 2024-04-15 ENCOUNTER — Other Ambulatory Visit: Payer: Self-pay | Admitting: Family

## 2024-04-15 DIAGNOSIS — I10 Essential (primary) hypertension: Secondary | ICD-10-CM

## 2024-04-20 ENCOUNTER — Other Ambulatory Visit: Payer: Self-pay | Admitting: Medical Genetics

## 2024-04-26 ENCOUNTER — Ambulatory Visit: Admitting: Family

## 2024-05-05 ENCOUNTER — Other Ambulatory Visit: Payer: Self-pay | Admitting: Family

## 2024-05-12 ENCOUNTER — Other Ambulatory Visit

## 2024-05-31 ENCOUNTER — Ambulatory Visit: Admitting: Family
